# Patient Record
Sex: Male | Born: 1940 | Race: White | Hispanic: No | Marital: Married | Smoking: Never smoker
Health system: Southern US, Community
[De-identification: ages and names within clinical notes are randomized; demographics above are authoritative.]

## PROBLEM LIST (undated history)

## (undated) DIAGNOSIS — G629 Polyneuropathy, unspecified: Secondary | ICD-10-CM

## (undated) DIAGNOSIS — M1990 Unspecified osteoarthritis, unspecified site: Secondary | ICD-10-CM

## (undated) DIAGNOSIS — N200 Calculus of kidney: Secondary | ICD-10-CM

## (undated) DIAGNOSIS — L409 Psoriasis, unspecified: Secondary | ICD-10-CM

## (undated) DIAGNOSIS — M79669 Pain in unspecified lower leg: Secondary | ICD-10-CM

## (undated) DIAGNOSIS — F32A Depression, unspecified: Secondary | ICD-10-CM

## (undated) DIAGNOSIS — Z8719 Personal history of other diseases of the digestive system: Secondary | ICD-10-CM

## (undated) DIAGNOSIS — I499 Cardiac arrhythmia, unspecified: Secondary | ICD-10-CM

## (undated) DIAGNOSIS — I219 Acute myocardial infarction, unspecified: Secondary | ICD-10-CM

## (undated) DIAGNOSIS — I10 Essential (primary) hypertension: Secondary | ICD-10-CM

## (undated) DIAGNOSIS — F329 Major depressive disorder, single episode, unspecified: Secondary | ICD-10-CM

## (undated) DIAGNOSIS — G5700 Lesion of sciatic nerve, unspecified lower limb: Secondary | ICD-10-CM

## (undated) DIAGNOSIS — Z87442 Personal history of urinary calculi: Secondary | ICD-10-CM

## (undated) DIAGNOSIS — K429 Umbilical hernia without obstruction or gangrene: Secondary | ICD-10-CM

## (undated) DIAGNOSIS — G4730 Sleep apnea, unspecified: Secondary | ICD-10-CM

## (undated) DIAGNOSIS — E785 Hyperlipidemia, unspecified: Secondary | ICD-10-CM

## (undated) DIAGNOSIS — J302 Other seasonal allergic rhinitis: Secondary | ICD-10-CM

## (undated) DIAGNOSIS — K219 Gastro-esophageal reflux disease without esophagitis: Secondary | ICD-10-CM

## (undated) DIAGNOSIS — I2510 Atherosclerotic heart disease of native coronary artery without angina pectoris: Secondary | ICD-10-CM

## (undated) HISTORY — DX: Calculus of kidney: N20.0

## (undated) HISTORY — DX: Essential (primary) hypertension: I10

## (undated) HISTORY — DX: Major depressive disorder, single episode, unspecified: F32.9

## (undated) HISTORY — DX: Umbilical hernia without obstruction or gangrene: K42.9

## (undated) HISTORY — DX: Pain in unspecified lower leg: M79.669

## (undated) HISTORY — DX: Psoriasis, unspecified: L40.9

## (undated) HISTORY — DX: Depression, unspecified: F32.A

## (undated) HISTORY — DX: Hyperlipidemia, unspecified: E78.5

## (undated) HISTORY — DX: Atherosclerotic heart disease of native coronary artery without angina pectoris: I25.10

---

## 1996-01-20 HISTORY — PX: PROSTATE SURGERY: SHX751

## 2003-01-20 DIAGNOSIS — I499 Cardiac arrhythmia, unspecified: Secondary | ICD-10-CM

## 2003-01-20 HISTORY — DX: Cardiac arrhythmia, unspecified: I49.9

## 2009-07-24 ENCOUNTER — Encounter (INDEPENDENT_AMBULATORY_CARE_PROVIDER_SITE_OTHER): Payer: Self-pay | Admitting: Otolaryngology

## 2009-07-24 ENCOUNTER — Ambulatory Visit (HOSPITAL_COMMUNITY): Admission: RE | Admit: 2009-07-24 | Discharge: 2009-07-25 | Payer: Self-pay | Admitting: Otolaryngology

## 2009-07-24 HISTORY — PX: INNER EAR SURGERY: SHX679

## 2010-02-04 ENCOUNTER — Other Ambulatory Visit: Payer: Self-pay | Admitting: Dermatology

## 2010-03-13 ENCOUNTER — Ambulatory Visit (INDEPENDENT_AMBULATORY_CARE_PROVIDER_SITE_OTHER): Payer: Medicare Other | Admitting: Infectious Diseases

## 2010-03-13 ENCOUNTER — Encounter: Payer: Self-pay | Admitting: Infectious Diseases

## 2010-03-13 DIAGNOSIS — B449 Aspergillosis, unspecified: Secondary | ICD-10-CM | POA: Insufficient documentation

## 2010-03-13 DIAGNOSIS — H7290 Unspecified perforation of tympanic membrane, unspecified ear: Secondary | ICD-10-CM

## 2010-03-18 ENCOUNTER — Telehealth (INDEPENDENT_AMBULATORY_CARE_PROVIDER_SITE_OTHER): Payer: Self-pay | Admitting: *Deleted

## 2010-03-18 NOTE — Miscellaneous (Addendum)
  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

## 2010-03-27 NOTE — Progress Notes (Signed)
Summary: Pt. rxed for positive ear culture results per Dr. Maurice March  Phone Note Outgoing Call   Call placed by: Jennet Maduro, RN 03/18/10, 1433 Call placed to: Dr. Maurice March Action Taken: Information Sent Summary of Call: Ear culture results emailed to Dr. Maurice March for review.    Follow-up for Phone Call        Email response from Dr. Maurice March, "add colistin otic drops 2 TID to left ear and oral rifampin 300mg  Bid all for 10 days. Tim"     New/Updated Medications: COLY-MYCIN S 3.03-21-08-0.5 MG/ML SUSP (NEOMYCIN-COLIST-HC-THONZONIUM) Administer 2 drops in the left ear three times a day RIFAMPIN 300 MG CAPS (RIFAMPIN) Take 1 capsule by mouth two times a day for 10 days Prescriptions: RIFAMPIN 300 MG CAPS (RIFAMPIN) Take 1 capsule by mouth two times a day for 10 days  #20 x 0   Entered by:   Jennet Maduro RN   Authorized by:   Lina Sayre MD   Signed by:   Jennet Maduro RN on 03/19/2010   Method used:   Telephoned to ...       [facility]* (retail)       [address] [place], [place]  [address]       Ph: [phone] or [phone]       Fax: [phone]   RxID:   [id number] COLY-MYCIN S 3.03-21-08-0.5 MG/ML SUSP (NEOMYCIN-COLIST-HC-THONZONIUM) Administer 2 drops in the left ear three times a day  #1 bottle x 0   Entered by:   Jennet Maduro RN   Authorized by:   Lina Sayre MD   Signed by:   Jennet Maduro RN on 03/19/2010   Method used:   Telephoned to ...       [facility]* (retail)       [address] [place], [place]  [address]       Ph: [phone] or [phone]       Fax: [phone]   RxID:   [id number]  RX previously called to [facility] by Dr. Maurice March. .sign

## 2010-03-28 ENCOUNTER — Encounter: Payer: Self-pay | Admitting: *Deleted

## 2010-04-03 ENCOUNTER — Ambulatory Visit: Payer: Medicare Other | Admitting: Infectious Diseases

## 2010-04-06 LAB — ANAEROBIC CULTURE: Gram Stain: NONE SEEN

## 2010-04-06 LAB — WOUND CULTURE: Gram Stain: NONE SEEN

## 2010-04-07 LAB — BASIC METABOLIC PANEL
Calcium: 9 mg/dL (ref 8.4–10.5)
Creatinine, Ser: 1.1 mg/dL (ref 0.4–1.5)
GFR calc Af Amer: >60 mL/min (ref >60)
GFR calc non Af Amer: >60 mL/min (ref >60)
Glucose, Bld: 84 mg/dL (ref 70–99)
Sodium: 139 mEq/L (ref 135–145)

## 2010-04-07 LAB — CBC
HCT: 43.4 % (ref 39.0–52.0)
Hemoglobin: 14.9 g/dL (ref 13.0–17.0)
MCH: 32.2 pg (ref 26.0–34.0)
MCHC: 34.4 g/dL (ref 30.0–36.0)
MCV: 93.5 fL (ref 78.0–100.0)
Platelets: 176 10*3/uL (ref 150–400)
RBC: 4.64 MIL/uL (ref 4.22–5.81)
RDW: 13.1 % (ref 11.5–15.5)
WBC: 6.6 K/uL (ref 4.0–10.5)

## 2010-04-07 LAB — URINALYSIS, ROUTINE W REFLEX MICROSCOPIC
Bilirubin Urine: NEGATIVE
Glucose, UA: NEGATIVE mg/dL
Hgb urine dipstick: NEGATIVE
Ketones, ur: NEGATIVE mg/dL
Nitrite: NEGATIVE
Protein, ur: NEGATIVE mg/dL
Specific Gravity, Urine: 1.021 (ref 1.005–1.030)
Urobilinogen, UA: 0.2 mg/dL (ref 0.0–1.0)
pH: 5.5 (ref 5.0–8.0)

## 2010-04-07 LAB — SURGICAL PCR SCREEN
MRSA, PCR: NEGATIVE
Staphylococcus aureus: NEGATIVE

## 2010-04-07 LAB — BASIC METABOLIC PANEL WITH GFR
BUN: 16 mg/dL (ref 6–23)
CO2: 27 meq/L (ref 19–32)
Chloride: 108 meq/L (ref 96–112)
Potassium: 4.5 meq/L (ref 3.5–5.1)

## 2011-05-20 ENCOUNTER — Encounter (INDEPENDENT_AMBULATORY_CARE_PROVIDER_SITE_OTHER): Payer: Self-pay | Admitting: Surgery

## 2011-05-21 ENCOUNTER — Encounter (INDEPENDENT_AMBULATORY_CARE_PROVIDER_SITE_OTHER): Payer: Self-pay | Admitting: Surgery

## 2011-05-21 ENCOUNTER — Ambulatory Visit (INDEPENDENT_AMBULATORY_CARE_PROVIDER_SITE_OTHER): Payer: Medicare Other | Admitting: Surgery

## 2011-05-21 VITALS — BP 134/86 | HR 66 | Temp 98.0°F | Ht 71.0 in | Wt 189.6 lb

## 2011-05-21 DIAGNOSIS — K429 Umbilical hernia without obstruction or gangrene: Secondary | ICD-10-CM

## 2011-05-21 NOTE — Progress Notes (Addendum)
[name], male   DOB: 1940-11-15, 71 y.o.   MRN: [id number]  Chief Complaint  Patient presents with  . Umbilical Hernia    HPI Carlos Davidson is a 71 y.o. male.   HPIReferred by Dr. Susa Raring for evaluation of umbilical hernia  This is a healthy 71 year old male who is quite active working in his garden and roundhouse who presents with about a month history of some discomfort at his umbilicus. He has noticed a bulge in the upper part of his umbilicus it remains reducible. He gets uncomfortable when he reduces it but the discomfort resolves when he is supine. It has enlarged slightly over the last couple of weeks. He denies any obstructive symptoms.  Past Medical History  Diagnosis Date  . Depression   . Hyperlipidemia   . Hypertension   . Hemorrhoids   . Psoriasis   . Pain, lower leg     Past Surgical History  Procedure Date  . Inner ear surgery 07/24/2009  . Prostate surgery 1998    Family History  Problem Relation Age of Onset  . Cancer Mother     breast  . Heart disease Mother   . Kidney disease Father     Social History History  Substance Use Topics  . Smoking status: Never Smoker   . Smokeless tobacco: Not on file  . Alcohol Use: No    Allergies  Allergen Reactions  . Sulfonamide Derivatives   . Tetracycline   . Codeine     Current Outpatient Prescriptions  Medication Sig Dispense Refill  . aspirin 81 MG EC tablet Take 81 mg by mouth daily.        Marland Kitchen neomycin-colistin-hydrocortisone-thonzonium (COLY-MYCIN S) 3.03-21-08-0.5 MG/ML otic suspension Place 2 drops into the left ear 3 (three) times daily.        . rifampin (RIFADIN) 300 MG capsule Take 300 mg by mouth 2 (two) times daily. for 10 days       . sertraline (ZOLOFT) 50 MG tablet Take 50 mg by mouth daily.          Review of Systems Review of Systems  Constitutional: Negative for fever, chills and unexpected weight change.  HENT: Negative for hearing loss, congestion, sore throat,  trouble swallowing and voice change.   Eyes: Negative for visual disturbance.  Respiratory: Negative for cough and wheezing.   Cardiovascular: Negative for chest pain, palpitations and leg swelling.  Gastrointestinal: Negative for nausea, vomiting, abdominal pain, diarrhea, constipation, blood in stool, abdominal distention, anal bleeding and rectal pain.  Genitourinary: Negative for hematuria and difficulty urinating.  Musculoskeletal: Negative for arthralgias.  Skin: Negative for rash and wound.  Neurological: Negative for seizures, syncope, weakness and headaches.  Hematological: Negative for adenopathy. Does not bruise/bleed easily.  Psychiatric/Behavioral: Negative for confusion.    Blood pressure 134/86, pulse 66, temperature 98 F (36.7 C), temperature source Temporal, height 5\' 11"  (1.803 m), weight 189 lb 9.6 oz (86.002 kg), SpO2 96.00%.  Physical Exam Physical Exam WDWN in NAD HEENT:  EOMI, sclera anicteric Neck:  No masses, no thyromegaly Lungs:  CTA bilaterally; normal respiratory effort CV:  Regular rate and rhythm; no murmurs Abd:  +bowel sounds, soft, non-tender, small reducible hernia at the upper edge of his umbilicus GU:  No sign of inguinal hernia Ext:  Well-perfused; no edema Skin:  Warm, dry; no sign of jaundice  Data Reviewed none  Assessment    Umbilical hernia  - reducible    Plan  Umbilical hernia repair with mesh.  The surgical procedure has been discussed with the patient.  Potential risks, benefits, alternative treatments, and expected outcomes have been explained.  All of the patient's questions at this time have been answered.  The likelihood of reaching the patient's treatment goal is good.  The patient understand the proposed surgical procedure and wishes to proceed.        Beatrice Sehgal K. 05/21/2011, 12:18 PM    .

## 2011-08-04 ENCOUNTER — Encounter (INDEPENDENT_AMBULATORY_CARE_PROVIDER_SITE_OTHER): Payer: Self-pay | Admitting: Surgery

## 2011-08-04 ENCOUNTER — Ambulatory Visit (INDEPENDENT_AMBULATORY_CARE_PROVIDER_SITE_OTHER): Payer: Medicare Other | Admitting: Surgery

## 2011-08-04 VITALS — BP 146/82 | HR 72 | Temp 97.9°F | Resp 16 | Ht 71.0 in | Wt 190.5 lb

## 2011-08-04 DIAGNOSIS — K429 Umbilical hernia without obstruction or gangrene: Secondary | ICD-10-CM

## 2011-08-04 NOTE — Progress Notes (Signed)
The patient is here to update his H&P prior to surgery.  His last visit was in early May. His umbilical hernia has not enlarged and causes only mild discomfort.  Nothing new with his medical status.  PE:  Small partially reducible umbilical hernia at upper edge of umbilicus  Imp:  Umbilical hernia Plan:  Umbilical hernia repair with mesh. The surgical procedure has been discussed with the patient.  Potential risks, benefits, alternative treatments, and expected outcomes have been explained.  All of the patient's questions at this time have been answered.  The likelihood of reaching the patient's treatment goal is good.  The patient understand the proposed surgical procedure and wishes to proceed.  Carlos Davidson K. Carlos Eckard, MD, FACS [facility]  08/04/2011 9:48 AM    Old note from 05/21/11       Chief Complaint   Patient presents with   .  Umbilical Hernia        HPI Carlos Davidson is a 70 y.o. male.   HPIReferred by Dr. David Davidson for evaluation of umbilical hernia   This is a healthy 70-year-old male who is quite active working in his garden and roundhouse who presents with about a month history of some discomfort at his umbilicus. He has noticed a bulge in the upper part of his umbilicus it remains reducible. He gets uncomfortable when he reduces it but the discomfort resolves when he is supine. It has enlarged slightly over the last couple of weeks. He denies any obstructive symptoms.    Past Medical History   Diagnosis  Date   .  Depression     .  Hyperlipidemia     .  Hypertension     .  Hemorrhoids     .  Psoriasis     .  Pain, lower leg           Past Surgical History   Procedure  Date   .  Inner ear surgery  07/24/2009   .  Prostate surgery  1998         Family History   Problem  Relation  Age of Onset   .  Cancer  Mother         breast   .  Heart disease  Mother     .  Kidney disease  Father          Social History History   Substance Use  Topics   .  Smoking status:  Never Smoker    .  Smokeless tobacco:  Not on file   .  Alcohol Use:  No         Allergies   Allergen  Reactions   .  Sulfonamide Derivatives     .  Tetracycline     .  Codeine           Current Outpatient Prescriptions   Medication  Sig  Dispense  Refill   .  aspirin 81 MG EC tablet  Take 81 mg by mouth daily.           .  neomycin-colistin-hydrocortisone-thonzonium (COLY-MYCIN S) 3.03-21-08-0.5 MG/ML otic suspension  Place 2 drops into the left ear 3 (three) times daily.           .  rifampin (RIFADIN) 300 MG capsule  Take 300 mg by mouth 2 (two) times daily. for 10 days          .  sertraline (ZOLOFT) 50 MG tablet  Take 50   mg by mouth daily.                Review of Systems Review of Systems  Constitutional: Negative for fever, chills and unexpected weight change.  HENT: Negative for hearing loss, congestion, sore throat, trouble swallowing and voice change.   Eyes: Negative for visual disturbance.  Respiratory: Negative for cough and wheezing.   Cardiovascular: Negative for chest pain, palpitations and leg swelling.  Gastrointestinal: Negative for nausea, vomiting, abdominal pain, diarrhea, constipation, blood in stool, abdominal distention, anal bleeding and rectal pain.  Genitourinary: Negative for hematuria and difficulty urinating.  Musculoskeletal: Negative for arthralgias.  Skin: Negative for rash and wound.  Neurological: Negative for seizures, syncope, weakness and headaches.  Hematological: Negative for adenopathy. Does not bruise/bleed easily.  Psychiatric/Behavioral: Negative for confusion.      Blood pressure 134/86, pulse 66, temperature 98 F (36.7 C), temperature source Temporal, height 5' 11" (1.803 m), weight 189 lb 9.6 oz (86.002 kg), SpO2 96.00%.   Physical Exam Physical Exam WDWN in NAD HEENT:  EOMI, sclera anicteric Neck:  No masses, no thyromegaly Lungs:  CTA bilaterally; normal respiratory effort CV:   Regular rate and rhythm; no murmurs Abd:  +bowel sounds, soft, non-tender, small reducible hernia at the upper edge of his umbilicus GU:  No sign of inguinal hernia Ext:  Well-perfused; no edema Skin:  Warm, dry; no sign of jaundice   Data Reviewed none   Assessment    Umbilical hernia  - reducible     Plan    Umbilical hernia repair with mesh.  The surgical procedure has been discussed with the patient.  Potential risks, benefits, alternative treatments, and expected outcomes have been explained.  All of the patient's questions at this time have been answered.  The likelihood of reaching the patient's treatment goal is good.  The patient understand the proposed surgical procedure and wishes to proceed.            Carlos Davidson K. 05/21/2011, 12:18 PM      

## 2011-08-12 ENCOUNTER — Encounter (HOSPITAL_COMMUNITY): Payer: Self-pay | Admitting: Pharmacy Technician

## 2011-08-18 ENCOUNTER — Encounter (HOSPITAL_COMMUNITY): Payer: Self-pay

## 2011-08-18 ENCOUNTER — Encounter (HOSPITAL_COMMUNITY)
Admission: RE | Admit: 2011-08-18 | Discharge: 2011-08-18 | Disposition: A | Discharge status: Home / Self Care | Payer: Medicare Other | Source: Ambulatory Visit | Attending: Surgery | Admitting: Surgery

## 2011-08-18 ENCOUNTER — Ambulatory Visit (HOSPITAL_COMMUNITY)
Admission: RE | Admit: 2011-08-18 | Discharge: 2011-08-18 | Disposition: A | Discharge status: Home / Self Care | Payer: Medicare Other | Source: Ambulatory Visit | Attending: Surgery | Admitting: Surgery

## 2011-08-18 DIAGNOSIS — Z01812 Encounter for preprocedural laboratory examination: Secondary | ICD-10-CM | POA: Insufficient documentation

## 2011-08-18 DIAGNOSIS — I498 Other specified cardiac arrhythmias: Secondary | ICD-10-CM | POA: Insufficient documentation

## 2011-08-18 DIAGNOSIS — Z01818 Encounter for other preprocedural examination: Secondary | ICD-10-CM | POA: Insufficient documentation

## 2011-08-18 DIAGNOSIS — Z01810 Encounter for preprocedural cardiovascular examination: Secondary | ICD-10-CM | POA: Insufficient documentation

## 2011-08-18 HISTORY — DX: Sleep apnea, unspecified: G47.30

## 2011-08-18 HISTORY — DX: Other seasonal allergic rhinitis: J30.2

## 2011-08-18 HISTORY — DX: Lesion of sciatic nerve, unspecified lower limb: G57.00

## 2011-08-18 HISTORY — DX: Unspecified osteoarthritis, unspecified site: M19.90

## 2011-08-18 HISTORY — DX: Gastro-esophageal reflux disease without esophagitis: K21.9

## 2011-08-18 HISTORY — DX: Cardiac arrhythmia, unspecified: I49.9

## 2011-08-18 HISTORY — DX: Personal history of other diseases of the digestive system: Z87.19

## 2011-08-18 LAB — CBC
MCH: 31.2 pg (ref 26.0–34.0)
MCHC: 34.4 g/dL (ref 30.0–36.0)
MCV: 90.9 fL (ref 78.0–100.0)
Platelets: 179 10*3/uL (ref 150–400)
RBC: 4.61 MIL/uL (ref 4.22–5.81)
RDW: 13 % (ref 11.5–15.5)

## 2011-08-18 LAB — BASIC METABOLIC PANEL
Calcium: 8.6 mg/dL (ref 8.4–10.5)
Creatinine, Ser: 0.98 mg/dL (ref 0.50–1.35)
GFR calc Af Amer: >90 mL/min (ref >90)
GFR calc non Af Amer: 82 mL/min — ABNORMAL LOW (ref >90)
Sodium: 137 mEq/L (ref 135–145)

## 2011-08-18 LAB — SURGICAL PCR SCREEN
MRSA, PCR: NEGATIVE
Staphylococcus aureus: NEGATIVE

## 2011-08-18 NOTE — Patient Instructions (Addendum)
20 Carlos Davidson  08/18/2011   Your procedure is scheduled on:  08/26/11  Wednesday  Surgery 2956-2130  Report to [facility] at   0630    AM.  Call this number if you have problems the morning of surgery: [phone]     Or PST   [phone]  [facility]   Remember:   Do not eat food or drink any fluids :After Midnight. Tuesday NIGHT     STOP ASPIRIN, ANTIINFLAMMATORIES, or herbals 5 days before surgery-  TYLENOL IS OK  Take these medicines the morning of surgery with A SIP OF WATER: Zoloft   Do not wear jewelry, make-up or nail polish.  Do not wear lotions, powders, or perfumes. You may wear deodorant.  Do not shave 48 hours prior to surgery.  Do not bring valuables to the hospital.  Contacts, dentures or bridgework may not be worn into surgery.  Leave suitcase in the car. After surgery it may be brought to your room.  For patients admitted to the hospital, checkout time is 11:00 AM the day of discharge.   Patients discharged the day of surgery will not be allowed to drive home.  Name and phone number of your driver:wife                                                                      Special Instructions: CHG Shower Use Special Wash: 1/2 bottle night before surgery and 1/2 bottle morning of surgery. REGULAR SOAP FACE AND PRIVATES                        MEN-MAY SHAVE FACE MORNING OF SURGERY  Please read over the following fact sheets that you were given: MRSA Information

## 2011-08-18 NOTE — Progress Notes (Signed)
08/18/11 1013  OBSTRUCTIVE SLEEP APNEA  Have you ever been diagnosed with sleep apnea through a sleep study? No  Do you snore loudly (loud enough to be heard through closed doors)?  0  Do you often feel tired, fatigued, or sleepy during the daytime? 1  Has anyone observed you stop breathing during your sleep? 0  Do you have, or are you being treated for high blood pressure? 1  BMI more than 35 kg/m2? 0  Age over 69 years old? 1  Neck circumference greater than 40 cm/18 inches? 0  Gender: 1  Obstructive Sleep Apnea Score 4   Score 4 or greater  Updated health history;Results sent to PCP

## 2011-08-26 ENCOUNTER — Encounter (HOSPITAL_COMMUNITY): Payer: Self-pay | Admitting: *Deleted

## 2011-08-26 ENCOUNTER — Ambulatory Visit (HOSPITAL_COMMUNITY)
Admission: RE | Admit: 2011-08-26 | Discharge: 2011-08-26 | Disposition: A | Discharge status: Home / Self Care | Payer: Medicare Other | Source: Ambulatory Visit | Attending: Surgery | Admitting: Surgery

## 2011-08-26 ENCOUNTER — Encounter (HOSPITAL_COMMUNITY)
Admission: RE | Disposition: A | Discharge status: Home / Self Care | Payer: Self-pay | Source: Ambulatory Visit | Attending: Surgery

## 2011-08-26 ENCOUNTER — Ambulatory Visit (HOSPITAL_COMMUNITY): Payer: Medicare Other | Admitting: Anesthesiology

## 2011-08-26 ENCOUNTER — Encounter (HOSPITAL_COMMUNITY): Payer: Self-pay | Admitting: Anesthesiology

## 2011-08-26 DIAGNOSIS — E785 Hyperlipidemia, unspecified: Secondary | ICD-10-CM | POA: Insufficient documentation

## 2011-08-26 DIAGNOSIS — Z7982 Long term (current) use of aspirin: Secondary | ICD-10-CM | POA: Insufficient documentation

## 2011-08-26 DIAGNOSIS — K420 Umbilical hernia with obstruction, without gangrene: Secondary | ICD-10-CM | POA: Insufficient documentation

## 2011-08-26 DIAGNOSIS — I10 Essential (primary) hypertension: Secondary | ICD-10-CM | POA: Insufficient documentation

## 2011-08-26 DIAGNOSIS — L408 Other psoriasis: Secondary | ICD-10-CM | POA: Insufficient documentation

## 2011-08-26 DIAGNOSIS — K429 Umbilical hernia without obstruction or gangrene: Secondary | ICD-10-CM

## 2011-08-26 DIAGNOSIS — Z79899 Other long term (current) drug therapy: Secondary | ICD-10-CM | POA: Insufficient documentation

## 2011-08-26 HISTORY — PX: HERNIA REPAIR: SHX51

## 2011-08-26 HISTORY — PX: UMBILICAL HERNIA REPAIR: SHX196

## 2011-08-26 SURGERY — REPAIR, HERNIA, UMBILICAL, ADULT
Anesthesia: General | Site: Abdomen | Wound class: Clean

## 2011-08-26 MED ORDER — ONDANSETRON HCL 4 MG/2ML IJ SOLN: 4.0000 mg | INTRAMUSCULAR | Status: DC | PRN

## 2011-08-26 MED ORDER — PROPOFOL 10 MG/ML IV EMUL
INTRAVENOUS | Status: DC | PRN
  Administered 2011-08-26: 150 mg via INTRAVENOUS

## 2011-08-26 MED ORDER — MORPHINE SULFATE 10 MG/ML IJ SOLN: 2.0000 mg | INTRAMUSCULAR | Status: DC | PRN

## 2011-08-26 MED ORDER — BUPIVACAINE-EPINEPHRINE 0.25% -1:200000 IJ SOLN
INTRAMUSCULAR | Status: DC | PRN
  Administered 2011-08-26: 5 mL

## 2011-08-26 MED ORDER — HYDROCODONE-ACETAMINOPHEN 5-325 MG PO TABS: 1.0000 | ORAL_TABLET | ORAL | Status: DC | PRN

## 2011-08-26 MED ORDER — MIDAZOLAM HCL 5 MG/5ML IJ SOLN
INTRAMUSCULAR | Status: DC | PRN
  Administered 2011-08-26: 2 mg via INTRAVENOUS

## 2011-08-26 MED ORDER — ACETAMINOPHEN 10 MG/ML IV SOLN
INTRAVENOUS | Status: DC | PRN
  Administered 2011-08-26: 1000 mg via INTRAVENOUS

## 2011-08-26 MED ORDER — LACTATED RINGERS IV SOLN
INTRAVENOUS | Status: DC | PRN
  Administered 2011-08-26 (×2): via INTRAVENOUS

## 2011-08-26 MED ORDER — CEFAZOLIN SODIUM-DEXTROSE 2-3 GM-% IV SOLR
INTRAVENOUS | Status: AC
  Filled 2011-08-26: qty 50

## 2011-08-26 MED ORDER — BUPIVACAINE-EPINEPHRINE 0.25% -1:200000 IJ SOLN
INTRAMUSCULAR | Status: AC
  Filled 2011-08-26: qty 1

## 2011-08-26 MED ORDER — ACETAMINOPHEN 10 MG/ML IV SOLN
INTRAVENOUS | Status: AC
  Filled 2011-08-26: qty 100

## 2011-08-26 MED ORDER — CEFAZOLIN SODIUM-DEXTROSE 2-3 GM-% IV SOLR
2.0000 g | Freq: Once | INTRAVENOUS | Status: AC
  Administered 2011-08-26: 2 g via INTRAVENOUS

## 2011-08-26 MED ORDER — HYDROCODONE-ACETAMINOPHEN 5-325 MG PO TABS: 1.0000 | ORAL_TABLET | ORAL | Status: AC | PRN

## 2011-08-26 MED ORDER — KETOROLAC TROMETHAMINE 30 MG/ML IJ SOLN
INTRAMUSCULAR | Status: DC | PRN
  Administered 2011-08-26: 30 mg via INTRAVENOUS

## 2011-08-26 MED ORDER — HYDROMORPHONE HCL PF 1 MG/ML IJ SOLN: 0.2500 mg | INTRAMUSCULAR | Status: DC | PRN

## 2011-08-26 MED ORDER — FENTANYL CITRATE 0.05 MG/ML IJ SOLN
INTRAMUSCULAR | Status: DC | PRN
  Administered 2011-08-26: 50 ug via INTRAVENOUS

## 2011-08-26 SURGICAL SUPPLY — 38 items
BENZOIN TINCTURE PRP APPL 2/3 (GAUZE/BANDAGES/DRESSINGS) ×3 IMPLANT
BLADE HEX COATED 2.75 (ELECTRODE) ×3 IMPLANT
BLADE SURG 15 STRL LF DISP TIS (BLADE) ×2 IMPLANT
BLADE SURG 15 STRL SS (BLADE) ×1
CLOTH BEACON ORANGE TIMEOUT ST (SAFETY) ×3 IMPLANT
CLSR STERI-STRIP ANTIMIC 1/2X4 (GAUZE/BANDAGES/DRESSINGS) ×3 IMPLANT
DECANTER SPIKE VIAL GLASS SM (MISCELLANEOUS) ×3 IMPLANT
DRAPE LAPAROTOMY T 102X78X121 (DRAPES) ×3 IMPLANT
DRAPE UTILITY XL STRL (DRAPES) ×3 IMPLANT
DRSG TEGADERM 4X4.75 (GAUZE/BANDAGES/DRESSINGS) ×3 IMPLANT
ELECT REM PT RETURN 9FT ADLT (ELECTROSURGICAL) ×3
ELECTRODE REM PT RTRN 9FT ADLT (ELECTROSURGICAL) ×2 IMPLANT
GLOVE BIO SURGEON STRL SZ7 (GLOVE) ×3 IMPLANT
GLOVE BIOGEL PI IND STRL 7.0 (GLOVE) IMPLANT
GLOVE BIOGEL PI IND STRL 7.5 (GLOVE) ×2 IMPLANT
GLOVE BIOGEL PI INDICATOR 7.0 (GLOVE)
GLOVE BIOGEL PI INDICATOR 7.5 (GLOVE) ×1
GOWN STRL NON-REIN LRG LVL3 (GOWN DISPOSABLE) ×6 IMPLANT
GOWN STRL REIN XL XLG (GOWN DISPOSABLE) ×6 IMPLANT
KIT BASIN OR (CUSTOM PROCEDURE TRAY) ×3 IMPLANT
NEEDLE HYPO 22GX1.5 SAFETY (NEEDLE) ×3 IMPLANT
NEEDLE HYPO 25X1 1.5 SAFETY (NEEDLE) IMPLANT
NS IRRIG 1000ML POUR BTL (IV SOLUTION) ×3 IMPLANT
PACK BASIC VI WITH GOWN DISP (CUSTOM PROCEDURE TRAY) ×3 IMPLANT
PENCIL BUTTON HOLSTER BLD 10FT (ELECTRODE) ×3 IMPLANT
SPONGE GAUZE 4X4 12PLY (GAUZE/BANDAGES/DRESSINGS) ×3 IMPLANT
SPONGE LAP 4X18 X RAY DECT (DISPOSABLE) ×6 IMPLANT
STRIP CLOSURE SKIN 1/2X4 (GAUZE/BANDAGES/DRESSINGS) ×3 IMPLANT
SUT MNCRL AB 4-0 PS2 18 (SUTURE) ×3 IMPLANT
SUT NOVA NAB DX-16 0-1 5-0 T12 (SUTURE) IMPLANT
SUT NOVA NAB GS-21 0 18 T12 DT (SUTURE) ×3 IMPLANT
SUT PROLENE 0 CT 1 CR/8 (SUTURE) IMPLANT
SUT PROLENE 0 CT 2 (SUTURE) IMPLANT
SUT VIC AB 3-0 SH 27 (SUTURE) ×1
SUT VIC AB 3-0 SH 27X BRD (SUTURE) ×2 IMPLANT
SUT VIC AB 3-0 SH 27XBRD (SUTURE) IMPLANT
SYR CONTROL 10ML LL (SYRINGE) ×3 IMPLANT
TOWEL OR 17X26 10 PK STRL BLUE (TOWEL DISPOSABLE) ×3 IMPLANT

## 2011-08-26 NOTE — Op Note (Signed)
Indications:  The patient presented with a history of a small non-reducible umbilical hernia.  The patient was examined and we recommended umbilical hernia repair.  Pre-operative diagnosis:  Umbilical hernia  Post-operative diagnosis:  Same  Surgeon: Keymiah Lyles K.   Assistants: none  Anesthesia: General LMA anesthesia  ASA Class: 2   Procedure Details  The patient was seen again in the Holding Room. The risks, benefits, complications, treatment options, and expected outcomes were discussed with the patient. The possibilities of reaction to medication, pulmonary aspiration, perforation of viscus, bleeding, recurrent infection, the need for additional procedures, and development of a complication requiring transfusion or further operation were discussed with the patient and/or family. There was concurrence with the proposed plan, and informed consent was obtained. The site of surgery was properly noted/marked. The patient was taken to the Operating Room, identified as Carlos Davidson, and the procedure verified as umbilical hernia repair. A Time Out was held and the above information confirmed.  After an adequate level of general anesthesia was obtained, the patient's abdomen was prepped with Chloraprep and draped in sterile fashion.  We made a transverse incision above the umbilicus.  Dissection was carried down to the hernia sac with cautery.  We dissected bluntly around the hernia sac down to the edge of the fascial defect.  We reduced the hernia sac back into the pre-peritoneal space.  The fascial defect measured 5 mm.  We cleared the fascia in all directions.  The fascial defect was closed with multiple interrupted figure-of-eight 0 Novofil sutures.  The base of the umbilicus was tacked down with 3-0 Vicryl.  3-0 Vicryl was used to close the subcutaneous tissues and 4-0 Monocryl was used to close the skin.  Steri-strips and clean dressing were applied.  The patient was extubated and brought to  the recovery room in stable condition.  All sponge, instrument, and needle counts were correct prior to closure and at the conclusion of the case.   Estimated Blood Loss: Minimal          Complications: None; patient tolerated the procedure well.         Disposition: PACU - hemodynamically stable.         Condition: stable  [name]. [name], MD, [name]  08/26/2011 9:11 AM

## 2011-08-26 NOTE — H&P (View-Only) (Signed)
The patient is here to update his H&P prior to surgery.  His last visit was in early May. His umbilical hernia has not enlarged and causes only mild discomfort.  Nothing new with his medical status.  PE:  Small partially reducible umbilical hernia at upper edge of umbilicus  Imp:  Umbilical hernia Plan:  Umbilical hernia repair with mesh. The surgical procedure has been discussed with the patient.  Potential risks, benefits, alternative treatments, and expected outcomes have been explained.  All of the patient's questions at this time have been answered.  The likelihood of reaching the patient's treatment goal is good.  The patient understand the proposed surgical procedure and wishes to proceed.  Carlos Davidson. [name], MD, [facility]  08/04/2011 9:48 AM    Old note from 05/21/11       Chief Complaint   Patient presents with   .  Umbilical Hernia        HPI Carlos Davidson is a 71 y.o. male.   HPIReferred by Dr. Susa Raring for evaluation of umbilical hernia   This is a healthy 71 year old male who is quite active working in his garden and roundhouse who presents with about a month history of some discomfort at his umbilicus. He has noticed a bulge in the upper part of his umbilicus it remains reducible. He gets uncomfortable when he reduces it but the discomfort resolves when he is supine. It has enlarged slightly over the last couple of weeks. He denies any obstructive symptoms.    Past Medical History   Diagnosis  Date   .  Depression     .  Hyperlipidemia     .  Hypertension     .  Hemorrhoids     .  Psoriasis     .  Pain, lower leg           Past Surgical History   Procedure  Date   .  Inner ear surgery  07/24/2009   .  Prostate surgery  1998         Family History   Problem  Relation  Age of Onset   .  Cancer  Mother         breast   .  Heart disease  Mother     .  Kidney disease  Father          Social History History   Substance Use  Topics   .  Smoking status:  Never Smoker    .  Smokeless tobacco:  Not on file   .  Alcohol Use:  No         Allergies   Allergen  Reactions   .  Sulfonamide Derivatives     .  Tetracycline     .  Codeine           Current Outpatient Prescriptions   Medication  Sig  Dispense  Refill   .  aspirin 81 MG EC tablet  Take 81 mg by mouth daily.           Marland Kitchen  neomycin-colistin-hydrocortisone-thonzonium (COLY-MYCIN S) 3.03-21-08-0.5 MG/ML otic suspension  Place 2 drops into the left ear 3 (three) times daily.           .  rifampin (RIFADIN) 300 MG capsule  Take 300 mg by mouth 2 (two) times daily. for 10 days          .  sertraline (ZOLOFT) 50 MG tablet  Take 50  mg by mouth daily.                Review of Systems Review of Systems  Constitutional: Negative for fever, chills and unexpected weight change.  HENT: Negative for hearing loss, congestion, sore throat, trouble swallowing and voice change.   Eyes: Negative for visual disturbance.  Respiratory: Negative for cough and wheezing.   Cardiovascular: Negative for chest pain, palpitations and leg swelling.  Gastrointestinal: Negative for nausea, vomiting, abdominal pain, diarrhea, constipation, blood in stool, abdominal distention, anal bleeding and rectal pain.  Genitourinary: Negative for hematuria and difficulty urinating.  Musculoskeletal: Negative for arthralgias.  Skin: Negative for rash and wound.  Neurological: Negative for seizures, syncope, weakness and headaches.  Hematological: Negative for adenopathy. Does not bruise/bleed easily.  Psychiatric/Behavioral: Negative for confusion.      Blood pressure 134/86, pulse 66, temperature 98 F (36.7 C), temperature source Temporal, height 5\' 11"  (1.803 m), weight 189 lb 9.6 oz (86.002 kg), SpO2 96.00%.   Physical Exam Physical Exam WDWN in NAD HEENT:  EOMI, sclera anicteric Neck:  No masses, no thyromegaly Lungs:  CTA bilaterally; normal respiratory effort CV:   Regular rate and rhythm; no murmurs Abd:  +bowel sounds, soft, non-tender, small reducible hernia at the upper edge of his umbilicus GU:  No sign of inguinal hernia Ext:  Well-perfused; no edema Skin:  Warm, dry; no sign of jaundice   Data Reviewed none   Assessment    Umbilical hernia  - reducible     Plan    Umbilical hernia repair with mesh.  The surgical procedure has been discussed with the patient.  Potential risks, benefits, alternative treatments, and expected outcomes have been explained.  All of the patient's questions at this time have been answered.  The likelihood of reaching the patient's treatment goal is good.  The patient understand the proposed surgical procedure and wishes to proceed.            [name] 05/21/2011, 12:18 PM

## 2011-08-26 NOTE — Interval H&P Note (Signed)
History and Physical Interval Note:  08/26/2011 8:19 AM  Carlos Davidson  has presented today for surgery, with the diagnosis of umbilical hernia  The various methods of treatment have been discussed with the patient and family. After consideration of risks, benefits and other options for treatment, the patient has consented to  Procedure(s) (LRB): HERNIA REPAIR UMBILICAL ADULT (N/A) INSERTION OF MESH (N/A) as a surgical intervention .  The patient's history has been reviewed, patient examined, no change in status, stable for surgery.  I have reviewed the patient's chart and labs.  Questions were answered to the patient's satisfaction.     Dayln Tugwell K.

## 2011-08-26 NOTE — Anesthesia Preprocedure Evaluation (Signed)
Anesthesia Evaluation  Patient identified by MRN, date of birth, ID band Patient awake    Reviewed: Allergy & Precautions, H&P , NPO status , Patient's Chart, lab work & pertinent test results, reviewed documented beta blocker date and time   Airway Mallampati: II TM Distance: >3 FB Neck ROM: Full    Dental  (+) Teeth Intact and Dental Advisory Given   Pulmonary neg pulmonary ROS,  breath sounds clear to auscultation        Cardiovascular negative cardio ROS  Rhythm:Regular Rate:Normal  Denies HTN Denies cardiac symptoms   Neuro/Psych negative neurological ROS  negative psych ROS   GI/Hepatic negative GI ROS, Neg liver ROS,   Endo/Other  negative endocrine ROS  Renal/GU negative Renal ROS  negative genitourinary   Musculoskeletal   Abdominal   Peds negative pediatric ROS (+)  Hematology negative hematology ROS (+)   Anesthesia Other Findings   Reproductive/Obstetrics negative OB ROS                           Anesthesia Physical Anesthesia Plan  ASA: II  Anesthesia Plan: General   Post-op Pain Management:    Induction: Intravenous  Airway Management Planned: LMA  Additional Equipment:   Intra-op Plan:   Post-operative Plan: Extubation in OR  Informed Consent: I have reviewed the patients History and Physical, chart, labs and discussed the procedure including the risks, benefits and alternatives for the proposed anesthesia with the patient or authorized representative who has indicated his/her understanding and acceptance.   Dental advisory given  Plan Discussed with: CRNA and Surgeon  Anesthesia Plan Comments:         Anesthesia Quick Evaluation

## 2011-08-26 NOTE — Transfer of Care (Signed)
Immediate Anesthesia Transfer of Care Note  Patient: Carlos Davidson  Procedure(s) Performed: Procedure(s) (LRB): HERNIA REPAIR UMBILICAL ADULT (N/A)  Patient Location: PACU  Anesthesia Type: General  Level of Consciousness: awake, sedated and patient cooperative  Airway & Oxygen Therapy: Patient Spontanous Breathing and Patient connected to face mask oxygen  Post-op Assessment: Report given to PACU RN and Post -op Vital signs reviewed and stable  Post vital signs: Reviewed and stable  Complications: No apparent anesthesia complications

## 2011-08-26 NOTE — Anesthesia Postprocedure Evaluation (Signed)
  Anesthesia Post-op Note  Patient: Carlos Davidson  Procedure(s) Performed: Procedure(s) (LRB): HERNIA REPAIR UMBILICAL ADULT (N/A)  Patient Location: PACU  Anesthesia Type: General  Level of Consciousness: oriented and sedated  Airway and Oxygen Therapy: Patient Spontanous Breathing  Post-op Pain: mild  Post-op Assessment: Post-op Vital signs reviewed, Patient's Cardiovascular Status Stable, Respiratory Function Stable and Patent Airway  Post-op Vital Signs: stable  Complications: No apparent anesthesia complications

## 2011-08-27 ENCOUNTER — Encounter (HOSPITAL_COMMUNITY): Payer: Self-pay | Admitting: Surgery

## 2011-08-27 ENCOUNTER — Telehealth (INDEPENDENT_AMBULATORY_CARE_PROVIDER_SITE_OTHER): Payer: Self-pay

## 2011-08-27 NOTE — Telephone Encounter (Signed)
Pt home doing well. PO appt made. 

## 2011-09-11 ENCOUNTER — Encounter (INDEPENDENT_AMBULATORY_CARE_PROVIDER_SITE_OTHER): Payer: Self-pay | Admitting: Surgery

## 2011-09-11 ENCOUNTER — Ambulatory Visit (INDEPENDENT_AMBULATORY_CARE_PROVIDER_SITE_OTHER): Payer: Medicare Other | Admitting: Surgery

## 2011-09-11 VITALS — BP 113/66 | HR 80 | Temp 97.7°F | Resp 18 | Ht 71.0 in | Wt 194.8 lb

## 2011-09-11 DIAGNOSIS — K429 Umbilical hernia without obstruction or gangrene: Secondary | ICD-10-CM

## 2011-09-11 NOTE — Progress Notes (Signed)
S/p umbilical hernia repair with mesh on 08/26/11.  The hernia defect was closed primarily.  The incision is healing well with no sign of infection.  He had some skin sensitivity from the tape, but this is resolved.  He may begin increasing his level of activity and can follow-up PRN.  [name]. [name], MD, [facility] Surgery  09/11/2011 10:51 AM

## 2013-01-16 ENCOUNTER — Ambulatory Visit: Payer: Medicare Other

## 2013-01-16 ENCOUNTER — Ambulatory Visit (INDEPENDENT_AMBULATORY_CARE_PROVIDER_SITE_OTHER): Payer: Medicare Other | Admitting: Internal Medicine

## 2013-01-16 VITALS — BP 152/78 | HR 66 | Temp 98.3°F | Resp 16 | Ht 71.5 in | Wt 197.8 lb

## 2013-01-16 DIAGNOSIS — R109 Unspecified abdominal pain: Secondary | ICD-10-CM

## 2013-01-16 DIAGNOSIS — T300 Burn of unspecified body region, unspecified degree: Secondary | ICD-10-CM

## 2013-01-16 DIAGNOSIS — N23 Unspecified renal colic: Secondary | ICD-10-CM

## 2013-01-16 LAB — POCT CBC
Granulocyte percent: 69.1 %G (ref 37–80)
Hemoglobin: 14.2 g/dL (ref 14.1–18.1)
MCH, POC: 31 pg (ref 27–31.2)
MCV: 99.1 fL — AB (ref 80–97)
MPV: 8.7 fL (ref 0–99.8)
POC MID %: 7.2 %M (ref 0–12)
RBC: 4.58 M/uL — AB (ref 4.69–6.13)
WBC: 8.8 10*3/uL (ref 4.6–10.2)

## 2013-01-16 LAB — POCT URINALYSIS DIPSTICK
Bilirubin, UA: NEGATIVE
Glucose, UA: NEGATIVE
Nitrite, UA: NEGATIVE

## 2013-01-16 LAB — POCT UA - MICROSCOPIC ONLY: Crystals, Ur, HPF, POC: NEGATIVE

## 2013-01-16 MED ORDER — KETOROLAC TROMETHAMINE 30 MG/ML IJ SOLN
30.0000 mg | Freq: Once | INTRAMUSCULAR | Status: AC
  Administered 2013-01-16: 30 mg via INTRAMUSCULAR

## 2013-01-16 MED ORDER — TAMSULOSIN HCL 0.4 MG PO CAPS: 0.4000 mg | ORAL_CAPSULE | Freq: Every day | ORAL | Status: DC

## 2013-01-16 MED ORDER — TRAMADOL HCL 50 MG PO TABS: 50.0000 mg | ORAL_TABLET | Freq: Three times a day (TID) | ORAL | Status: DC | PRN

## 2013-01-16 NOTE — Patient Instructions (Addendum)
Increase fluids. Flomax as directed. Ultram as directed. See the urologist in follow up as directed. If the red area on your back gets worse or increases return to the office. Do not apply hot compresses to your back.

## 2013-01-16 NOTE — Progress Notes (Signed)
Subjective:    [name], male    DOB: 11-Nov-1940, 72 y.o.   MRN: [id number]  Back Pain  72 year old gentleman with cc of sharp and throbbing pain in the left flank area for the past 2 days. Pain has gotten worse ranks it 8/10 in one localized area no radiation. No nausea no vomiting no fever. Pain does not increase with motion.No midline back pain no incontinence, No weakness or numbness of extremities. No known injury. Pt has had immunization for herpes zoster. His wife has been putting a hot pack on the area. He is unsure whether this has caused a burn to the area.Pain is improved with Pain is improved with advil but comes back after several hours.    Review of Systems  Constitutional: Negative for activity change.  HENT: Negative.   Eyes: Negative.   Respiratory: Negative.   Cardiovascular: Negative.   Endocrine: Negative.   Genitourinary: Negative.  Negative for urgency, frequency, hematuria, flank pain and decreased urine volume.  Musculoskeletal: Positive for back pain.  Skin: Negative.   Allergic/Immunologic: Negative.   Neurological: Negative.   Hematological: Negative.   Psychiatric/Behavioral: Negative.   All other systems reviewed and are negative.       Objective:   Physical Exam  Nursing note and vitals reviewed. Constitutional: He is oriented to person, place, and time. He appears well-developed and well-nourished.  HENT:  Head: Normocephalic.  Nose: Nose normal.  Mouth/Throat: Oropharynx is clear and moist.  Eyes: Conjunctivae and EOM are normal. Pupils are equal, round, and reactive to light.  Neck: Normal range of motion. Neck supple.  Cardiovascular: Normal rate, regular rhythm, normal heart sounds and intact distal pulses.   Pulmonary/Chest: Effort normal and breath sounds normal.  Abdominal: Soft. Bowel sounds are normal.  Musculoskeletal: Normal range of motion.  Neurological: He is alert and oriented to person, place, and time. He  has normal reflexes.  Skin: Skin is warm and dry. There is erythema.  Erythematous area to the left flank where he is having pain no vesicles or blisters. Pt has been using hot packs to the area  Psychiatric: He has a normal mood and affect. His behavior is normal. Thought content normal.     Results for orders placed in visit on 01/16/13  POCT CBC      Result Value Range   WBC 8.8  4.6 - 10.2 K/uL   Lymph, poc 2.1  0.6 - 3.4   POC LYMPH PERCENT 23.7  10 - 50 %L   MID (cbc) 0.6  0 - 0.9   POC MID % 7.2  0 - 12 %M   POC Granulocyte 6.1  2 - 6.9   Granulocyte percent 69.1  37 - 80 %G   RBC 4.58 (*) 4.69 - 6.13 M/uL   Hemoglobin 14.2  14.1 - 18.1 g/dL   HCT, POC 40.9  81.1 - 53.7 %   MCV 99.1 (*) 80 - 97 fL   MCH, POC 31.0  27 - 31.2 pg   MCHC 31.3 (*) 31.8 - 35.4 g/dL   RDW, POC 91.4     Platelet Count, POC 182  142 - 424 K/uL   MPV 8.7  0 - 99.8 fL  POCT URINALYSIS DIPSTICK      Result Value Range   Color, UA yellow     Clarity, UA clear     Glucose, UA neg     Bilirubin, UA neg  Ketones, UA neg     Spec Grav, UA 1.025     Blood, UA trace     pH, UA 5.5     Protein, UA neg     Urobilinogen, UA 0.2     Nitrite, UA neg     Leukocytes, UA Negative    POCT UA - MICROSCOPIC ONLY      Result Value Range   WBC, Ur, HPF, POC 1-2     RBC, urine, microscopic 3-5     Bacteria, U Microscopic trace     Mucus, UA neg     Epithelial cells, urine per micros 1-2     Crystals, Ur, HPF, POC neg     Casts, Ur, LPF, POC neg     Yeast, UA neg        3-6 rbc trace blood in urine [facility] reading (PRIMARY) by  Dr. Mindi Junker calcification l upper flank may be consistent with kidney stone..  Assessment & Plan:  72 year old gentleman with flank pain on the left, normal cbc, small amount of blood in the urine. Will treat as renal colic with analgesics and increase flluids. Flomax. Follow up with urology. If pain increases patient is instructed to go to the ER where further radiologic  evaluation including CT is possible to evaluate for kidney stone on the left. . The red area on his back is not vesicular in nature but may represent an early outbreak of shingles. Pt is instructed to look at this in the am , to avoid apply any more hot compresses to the area. If the area is breaking out with blisters or extending he is to return to this office for further evaluation and possible treatment of shingles in the am.

## 2013-02-10 ENCOUNTER — Other Ambulatory Visit: Payer: Self-pay | Admitting: Urology

## 2013-02-14 ENCOUNTER — Encounter (HOSPITAL_COMMUNITY): Payer: Self-pay | Admitting: General Practice

## 2013-02-14 NOTE — Progress Notes (Signed)
Pt. Denies any cardiac history. Pt. Has not had any work-up for any heart problems.

## 2013-02-15 ENCOUNTER — Encounter (HOSPITAL_COMMUNITY): Payer: Self-pay | Admitting: Pharmacy Technician

## 2013-02-16 ENCOUNTER — Ambulatory Visit (HOSPITAL_COMMUNITY)
Admission: RE | Admit: 2013-02-16 | Discharge: 2013-02-16 | Disposition: A | Discharge status: Home / Self Care | Payer: Medicare HMO | Source: Ambulatory Visit | Attending: Urology | Admitting: Urology

## 2013-02-16 ENCOUNTER — Encounter (HOSPITAL_COMMUNITY)
Admission: RE | Disposition: A | Discharge status: Home / Self Care | Payer: Self-pay | Source: Ambulatory Visit | Attending: Urology

## 2013-02-16 ENCOUNTER — Encounter (HOSPITAL_COMMUNITY): Payer: Self-pay | Admitting: *Deleted

## 2013-02-16 ENCOUNTER — Ambulatory Visit (HOSPITAL_COMMUNITY): Payer: Medicare HMO

## 2013-02-16 DIAGNOSIS — N201 Calculus of ureter: Secondary | ICD-10-CM | POA: Insufficient documentation

## 2013-02-16 SURGERY — LITHOTRIPSY, ESWL
Anesthesia: LOCAL | Laterality: Left

## 2013-02-16 MED ORDER — CIPROFLOXACIN HCL 500 MG PO TABS
500.0000 mg | ORAL_TABLET | ORAL | Status: AC
Start: 2013-02-16 — End: 2013-02-16
  Administered 2013-02-16: 500 mg via ORAL
  Filled 2013-02-16: qty 1

## 2013-02-16 MED ORDER — DIPHENHYDRAMINE HCL 25 MG PO CAPS
25.0000 mg | ORAL_CAPSULE | ORAL | Status: AC
  Administered 2013-02-16: 25 mg via ORAL
  Filled 2013-02-16: qty 1

## 2013-02-16 MED ORDER — DEXTROSE-NACL 5-0.45 % IV SOLN
INTRAVENOUS | Status: DC
  Administered 2013-02-16: 10:00:00 via INTRAVENOUS

## 2013-02-16 MED ORDER — DIAZEPAM 5 MG PO TABS
10.0000 mg | ORAL_TABLET | ORAL | Status: AC
  Administered 2013-02-16: 10 mg via ORAL
  Filled 2013-02-16: qty 2

## 2013-02-16 NOTE — Interval H&P Note (Signed)
History and Physical Interval Note:  02/16/2013 9:05 AM  Carlos Davidson  has presented today for surgery, with the diagnosis of LEFT URETERAL STONE   The various methods of treatment have been discussed with the patient and family. After consideration of risks, benefits and other options for treatment, the patient has consented to  Procedure(s): LEFT EXTRACORPOREAL SHOCK WAVE LITHOTRIPSY (ESWL) (Left) as a surgical intervention .  The patient's history has been reviewed, patient examined, no change in status, stable for surgery.  I have reviewed the patient's chart and labs.  Questions were answered to the patient's satisfaction.     Carolan Clines I

## 2013-02-16 NOTE — H&P (Signed)
ason For Visit 3 week f/u & KUB   Active Problems Problems  1. Left flank pain (789.09) 2. Microscopic hematuria (599.72) 3. 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 He still has not passed the stone. He continues to have intermittant Lt flank pain.     Originally referred by Dr. Benjaman Lobe for further evaluation of Lt flank pain & hematuria (3-5 RBC/hpf). He was started on Flomax. Pain occasionally moves to the LUQ, but never to the LLQ or testis.     Problem began 4 weeks ago with intermittent Left flank pain, Rx Advil, and was seen at urgent Care, with microscopic hematuria. He has had constant nausea, but has now begun to have vomiting this AM. no fever or chills. No hx of stones. Sodas: 1/week. No family hx of stones.   Past Medical History Problems  1. History of depression (V11.8) 2. History of gastroesophageal reflux (GERD) (V12.79)  Surgical History Problems  1. History of Ear Surgery 2. History of Hernia Repair 3. History of Transurethral Resection Of Prostate (TURP)  Current Meds 1. Tamsulosin HCl - 0.4 MG Oral Capsule; TAKE 1 CAPSULE Bedtime;  Therapy: [id number] to (Evaluate:26Dec2015)  Requested for: [id number]; Last  Rx:31Dec2014 Ordered 2. Zoloft TABS;  Therapy: (Recorded:31Dec2014) to Recorded  Allergies Medication  1. streptomycin 2. Sulfa Drugs 3. tetracycline  Family History Problems  1. Family history of myocardial infarction (V17.3) : Mother, Father 2. Family history of renal failure (V18.69) : Father  Social History Problems  1. Denied: History of Alcohol use 2. Caffeine use (V49.89)   1/week 3. Married 4. Never a smoker (V49.89)  Review of Systems Constitutional, skin, eye, otolaryngeal, hematologic/lymphatic, cardiovascular, pulmonary, endocrine, musculoskeletal, neurological and psychiatric system(s) were reviewed and  pertinent findings if present are noted.  Genitourinary: feelings of urinary urgency, nocturia, weak urinary stream, urinary stream starts and stops and hematuria, but no urinary frequency, no dysuria, no incontinence, no difficulty starting the urinary stream, no incomplete emptying of bladder, no post-void dribbling and initiating urination does not require straining.  Gastrointestinal: nausea, vomiting, flank pain and diarrhea, but no abdominal pain, no heartburn, no constipation and no melena.  Constitutional: no fever and no night sweats.    Vitals Vital Signs [Data Includes: Last 1 Day]  Recorded: 20Jan2015 02:55PM  Blood Pressure: 111 / 68 Temperature: 97.5 F Heart Rate: 81  Physical Exam Constitutional: Well nourished and well developed . No acute distress.  ENT:. The ears and nose are normal in appearance.  Neck: The appearance of the neck is normal and no neck mass is present.  Pulmonary: No respiratory distress and normal respiratory rhythm and effort.  Cardiovascular: Heart rate and rhythm are normal . No peripheral edema.  Abdomen: The abdomen is soft and nontender. No masses are palpated. No CVA tenderness. No hernias are palpable. No hepatosplenomegaly noted.  Genitourinary: Examination of the penis demonstrates no discharge, no masses, no lesions and a normal meatus. The scrotum is without lesions. The right epididymis is palpably normal and non-tender. The left epididymis is palpably normal and non-tender. The right testis is non-tender and without masses. The left testis is non-tender and without masses.  Lymphatics: The femoral and inguinal nodes are not enlarged or tender.  Skin: Normal skin turgor, no visible rash and no visible skin lesions.  Neuro/Psych:. Mood and affect are appropriate.  Results/Data Urine [Data Includes: Last 1 Day]   [id number]  COLOR GREEN   APPEARANCE CLEAR   SPECIFIC GRAVITY 1.025   pH 6.0   GLUCOSE NEG mg/dL  BILIRUBIN NEG   KETONE NEG  mg/dL  BLOOD TRACE   PROTEIN NEG mg/dL  UROBILINOGEN 0.2 mg/dL  NITRITE NEG   LEUKOCYTE ESTERASE NEG   SQUAMOUS EPITHELIAL/HPF RARE   WBC NONE SEEN WBC/hpf  RBC 0-2 RBC/hpf  BACTERIA NONE SEEN   CRYSTALS NONE SEEN   CASTS NONE SEEN   Selected Results  AU CT-STONE PROTOCOL [id number] 12:00AM Carolan Clines   Test Name Result Flag Reference  CT-STONE PROTOCOL (Report)    ** RADIOLOGY REPORT BY [facility] **   CLINICAL DATA: Left flank pain and microhematuria.  EXAM: CT ABDOMEN AND PELVIS WITHOUT CONTRAST (URINARY CALCULUS PROTOCOL)  TECHNIQUE: Multidetector CT imaging was performed through the abdomen and pelvis without intravenous contrast to include the urinary tract.  COMPARISON: None.  FINDINGS: There is left hydronephrosis with left perinephric stranding due to obstruction by a 5 mm stone in the proximal left ureter. There are no kidney stones bilaterally. There is question subtle low density lesion measuring 1 cm in the lower pole left kidney.  There are low density lesions within the liver, the largest is in the inferior right lobe liver measuring 2.8 x 3.9 cm. Evaluation is limited without contrast. These may represent liver cysts. The spleen, pancreas, adrenal glands are normal. There is no aortic aneurysm. There is no abdominal lymphadenopathy. There is no small bowel obstruction or diverticulitis. The appendix is normal. There is diverticulosis of colon.  Fluid-filled bladder is normal. Pelvic phleboliths are identified. There is minimal atelectasis or scar of the left lung base. There are degenerative joint changes of the spine. No acute abnormality is identified within the visualized bones.  IMPRESSION: Left hydronephrosis due to obstruction by 5 mm stone in the proximal left ureter.1   Electronically Signed  By: Abelardo Diesel M.D.  On: 01/18/2013 14:04   Procedure Left upper ureteral stone, 80mm, not progressing. He is having  intermittent L flank pain, some n/v. and pain. KUB shows the stone in the same position as the original x-rays. i have advised Mr Humphrey to have lithotripsy of the 25mm L upper ureteral stone.     Assessment Assessed  1. Left flank pain (789.09) 2. Ureteral stone (592.1) 3. Microscopic hematuria (599.72)  Continued pain from Left upper ureteral stone. KUB shows non-progression. Concern now is that stone is in original position.   Plan Lithotripsy.   Discussion/Summary cc: Ferman Hamming, MD [facility] [place]  cc: Dr. Maury Dus, [facility]     Signatures Electronically signed by : Carolan Clines, M.D.; Feb 07 2013  3:22PM EST

## 2013-02-16 NOTE — Discharge Instructions (Signed)
See [facility] instructions .  As per Dr Gaynelle Arabian, you are to resume all your home medications. You have a refill for flomax and percocet.

## 2013-07-09 ENCOUNTER — Emergency Department (HOSPITAL_BASED_OUTPATIENT_CLINIC_OR_DEPARTMENT_OTHER)
Admission: EM | Admit: 2013-07-09 | Discharge: 2013-07-09 | Disposition: A | Discharge status: Home / Self Care | Payer: Medicare HMO | Attending: Emergency Medicine | Admitting: Emergency Medicine

## 2013-07-09 ENCOUNTER — Encounter (HOSPITAL_BASED_OUTPATIENT_CLINIC_OR_DEPARTMENT_OTHER): Payer: Self-pay | Admitting: Emergency Medicine

## 2013-07-09 DIAGNOSIS — I499 Cardiac arrhythmia, unspecified: Secondary | ICD-10-CM | POA: Insufficient documentation

## 2013-07-09 DIAGNOSIS — M5431 Sciatica, right side: Secondary | ICD-10-CM

## 2013-07-09 DIAGNOSIS — Z7982 Long term (current) use of aspirin: Secondary | ICD-10-CM | POA: Insufficient documentation

## 2013-07-09 DIAGNOSIS — I10 Essential (primary) hypertension: Secondary | ICD-10-CM | POA: Insufficient documentation

## 2013-07-09 DIAGNOSIS — M129 Arthropathy, unspecified: Secondary | ICD-10-CM | POA: Insufficient documentation

## 2013-07-09 DIAGNOSIS — Z8719 Personal history of other diseases of the digestive system: Secondary | ICD-10-CM | POA: Insufficient documentation

## 2013-07-09 DIAGNOSIS — F3289 Other specified depressive episodes: Secondary | ICD-10-CM | POA: Insufficient documentation

## 2013-07-09 DIAGNOSIS — Z872 Personal history of diseases of the skin and subcutaneous tissue: Secondary | ICD-10-CM | POA: Insufficient documentation

## 2013-07-09 DIAGNOSIS — Z9889 Other specified postprocedural states: Secondary | ICD-10-CM | POA: Insufficient documentation

## 2013-07-09 DIAGNOSIS — F329 Major depressive disorder, single episode, unspecified: Secondary | ICD-10-CM | POA: Insufficient documentation

## 2013-07-09 DIAGNOSIS — Z79899 Other long term (current) drug therapy: Secondary | ICD-10-CM | POA: Insufficient documentation

## 2013-07-09 DIAGNOSIS — M5430 Sciatica, unspecified side: Secondary | ICD-10-CM | POA: Insufficient documentation

## 2013-07-09 MED ORDER — HYDROCODONE-ACETAMINOPHEN 5-325 MG PO TABS: 1.0000 | ORAL_TABLET | Freq: Four times a day (QID) | ORAL | Status: DC | PRN

## 2013-07-09 MED ORDER — PREDNISONE 10 MG PO TABS: 20.0000 mg | ORAL_TABLET | Freq: Two times a day (BID) | ORAL | Status: DC

## 2013-07-09 NOTE — Discharge Instructions (Signed)
Prednisone as prescribed. Hydrocodone as prescribed as needed for pain.  Follow up with your primary Dr. if not improving in the next week.   Sciatica Sciatica is pain, weakness, numbness, or tingling along the path of the sciatic nerve. The nerve starts in the lower back and runs down the back of each leg. The nerve controls the muscles in the lower leg and in the back of the knee, while also providing sensation to the back of the thigh, lower leg, and the sole of your foot. Sciatica is a symptom of another medical condition. For instance, nerve damage or certain conditions, such as a herniated disk or bone spur on the spine, pinch or put pressure on the sciatic nerve. This causes the pain, weakness, or other sensations normally associated with sciatica. Generally, sciatica only affects one side of the body. CAUSES   Herniated or slipped disc.  Degenerative disk disease.  A pain disorder involving the narrow muscle in the buttocks (piriformis syndrome).  Pelvic injury or fracture.  Pregnancy.  Tumor (rare). SYMPTOMS  Symptoms can vary from mild to very severe. The symptoms usually travel from the low back to the buttocks and down the back of the leg. Symptoms can include:  Mild tingling or dull aches in the lower back, leg, or hip.  Numbness in the back of the calf or sole of the foot.  Burning sensations in the lower back, leg, or hip.  Sharp pains in the lower back, leg, or hip.  Leg weakness.  Severe back pain inhibiting movement. These symptoms may get worse with coughing, sneezing, laughing, or prolonged sitting or standing. Also, being overweight may worsen symptoms. DIAGNOSIS  Your caregiver will perform a physical exam to look for common symptoms of sciatica. He or she may ask you to do certain movements or activities that would trigger sciatic nerve pain. Other tests may be performed to find the cause of the sciatica. These may include:  Blood  tests.  X-rays.  Imaging tests, such as an MRI or CT scan. TREATMENT  Treatment is directed at the cause of the sciatic pain. Sometimes, treatment is not necessary and the pain and discomfort goes away on its own. If treatment is needed, your caregiver may suggest:  Over-the-counter medicines to relieve pain.  Prescription medicines, such as anti-inflammatory medicine, muscle relaxants, or narcotics.  Applying heat or ice to the painful area.  Steroid injections to lessen pain, irritation, and inflammation around the nerve.  Reducing activity during periods of pain.  Exercising and stretching to strengthen your abdomen and improve flexibility of your spine. Your caregiver may suggest losing weight if the extra weight makes the back pain worse.  Physical therapy.  Surgery to eliminate what is pressing or pinching the nerve, such as a bone spur or part of a herniated disk. HOME CARE INSTRUCTIONS   Only take over-the-counter or prescription medicines for pain or discomfort as directed by your caregiver.  Apply ice to the affected area for 20 minutes, 3-4 times a day for the first 48-72 hours. Then try heat in the same way.  Exercise, stretch, or perform your usual activities if these do not aggravate your pain.  Attend physical therapy sessions as directed by your caregiver.  Keep all follow-up appointments as directed by your caregiver.  Do not wear high heels or shoes that do not provide proper support.  Check your mattress to see if it is too soft. A firm mattress may lessen your pain and discomfort. SEEK  IMMEDIATE MEDICAL CARE IF:   You lose control of your bowel or bladder (incontinence).  You have increasing weakness in the lower back, pelvis, buttocks, or legs.  You have redness or swelling of your back.  You have a burning sensation when you urinate.  You have pain that gets worse when you lie down or awakens you at night.  Your pain is worse than you have  experienced in the past.  Your pain is lasting longer than 4 weeks.  You are suddenly losing weight without reason. MAKE SURE YOU:  Understand these instructions.  Will watch your condition.  Will get help right away if you are not doing well or get worse. Document Released: 12/30/2000 Document Revised: 07/07/2011 Document Reviewed: 05/17/2011 [facility] Patient Information 2015 [place], [place]. This information is not intended to replace advice given to you by your health care provider. Make sure you discuss any questions you have with your health care provider.

## 2013-07-09 NOTE — ED Notes (Signed)
Pt reports pain to right hip and knee since Thursdays night. Sts taking tramadol with no relief. Denies injury

## 2013-07-09 NOTE — ED Provider Notes (Signed)
CSN: [id number]     Arrival date & time 07/09/13  1349 History   First MD Initiated Contact with Patient 07/09/13 1413     Chief Complaint  Patient presents with  . Leg Pain     (Consider location/radiation/quality/duration/timing/severity/associated sxs/prior Treatment) HPI Comments: Patient is a 73 year old male who presents with complaints of pain in the right hip and buttock radiating into his right thigh. This is been going on for the past 3 days. He has been taking ibuprofen and tramadol without relief. He denies any bowel or bladder complaints. He denies any injury or trauma.  Patient is a 73 y.o. male presenting with leg pain. The history is provided by the patient.  Leg Pain Location:  Hip, buttock and leg Time since incident:  2 days Injury: no   Hip location:  R hip Buttock location:  R buttock Leg location:  R leg Pain details:    Quality:  Sharp   Severity:  Moderate   Past Medical History  Diagnosis Date  . Depression   . Hyperlipidemia   . Hemorrhoids   . Psoriasis   . Pain, lower leg     burning right leg--STATES TO ELEVATE RIGHT ANKLE  . Umbilical hernia   . Hypertension     pt unaware 08/18/11  . Seasonal allergies     hay fever  . Compression of sciatic nerve     right leg with numbness above knee at times  . GERD (gastroesophageal reflux disease)   . H/O hiatal hernia   . Arthritis   . Sleep apnea     STOP BANG SCORE 4  . Dysrhythmia 2005    no cardiologist   Past Surgical History  Procedure Laterality Date  . Inner ear surgery  07/24/2009  . Prostate surgery  1998    TURP  . Umbilical hernia repair  08/26/2011    Procedure: HERNIA REPAIR UMBILICAL ADULT;  Surgeon: Imogene Burn. Georgette Dover, MD;  Location: [facility]S;  Service: General;  Laterality: N/A;  Umbilical Hernia Repair with Mesh  . Hernia repair  08/26/2011   Family History  Problem Relation Age of Onset  . Cancer Mother     breast  . Heart disease Mother   . Kidney disease Father    History   Substance Use Topics  . Smoking status: Never Smoker   . Smokeless tobacco: Never Used  . Alcohol Use: No    Review of Systems  All other systems reviewed and are negative.     Allergies  Tetracycline; Streptomycin; and Sulfonamide derivatives  Home Medications   Prior to Admission medications   Medication Sig Start Date End Date Taking? Authorizing Provider  aspirin 81 MG EC tablet Take 81 mg by mouth daily with breakfast.     Historical Provider, MD  Polyethylene Glycol 400 (BLINK TEARS) 0.25 % SOLN Apply 1 drop to eye 2 (two) times daily as needed (dry eyes).    Historical Provider, MD  sertraline (ZOLOFT) 50 MG tablet Take 50 mg by mouth daily with breakfast.     Historical Provider, MD  traMADol (ULTRAM) 50 MG tablet Take 50 mg by mouth every 6 (six) hours as needed for moderate pain.    Historical Provider, MD   BP 123/65  Temp(Src) 98.2 F (36.8 C) (Oral)  Resp 18  Ht 5\' 11"  (1.803 m)  Wt 188 lb (85.276 kg)  BMI 26.23 kg/m2  SpO2 98% Physical Exam  Nursing note and vitals reviewed. Constitutional: He is oriented  to person, place, and time. He appears well-developed and well-nourished. No distress.  HENT:  Head: Normocephalic and atraumatic.  Neck: Normal range of motion. Neck supple.  Musculoskeletal: Normal range of motion.  There is tenderness to palpation in the right buttock and lateral hip. He has good range of motion.  Neurological: He is alert and oriented to person, place, and time.  DTRs are 3+ and equal in the patellar and Achilles tendons of both legs. Strength is 5 out of 5 in the bilateral lower extremities. He is ambulatory on his heels and toes without difficulty.  Skin: Skin is warm and dry. He is not diaphoretic.    ED Course  Procedures (including critical care time) Labs Review Labs Reviewed - No data to display  Imaging Review No results found.   EKG Interpretation None      MDM   Final diagnoses:  None    This appears to be  sciatic nerve pain. He will be treated with prednisone and hydrocodone.  He is to followup with his primary Dr. if not improving in the next week.    Veryl Speak, MD 07/09/13 [phone]

## 2013-07-28 ENCOUNTER — Ambulatory Visit
Admission: RE | Admit: 2013-07-28 | Discharge: 2013-07-28 | Disposition: A | Discharge status: Home / Self Care | Payer: Medicare HMO | Source: Ambulatory Visit | Attending: Family Medicine | Admitting: Family Medicine

## 2013-07-28 ENCOUNTER — Other Ambulatory Visit: Payer: Self-pay | Admitting: Family Medicine

## 2013-07-28 DIAGNOSIS — M79651 Pain in right thigh: Secondary | ICD-10-CM

## 2013-07-28 DIAGNOSIS — M545 Low back pain, unspecified: Secondary | ICD-10-CM

## 2013-10-17 IMAGING — CR DG CHEST 2V
2 series · 2 of 2 positions shown · non-contrast
Comparison: 07/18/2009.

CLINICAL DATA: Preop.

CHEST - 2 VIEW

[w chest pa]
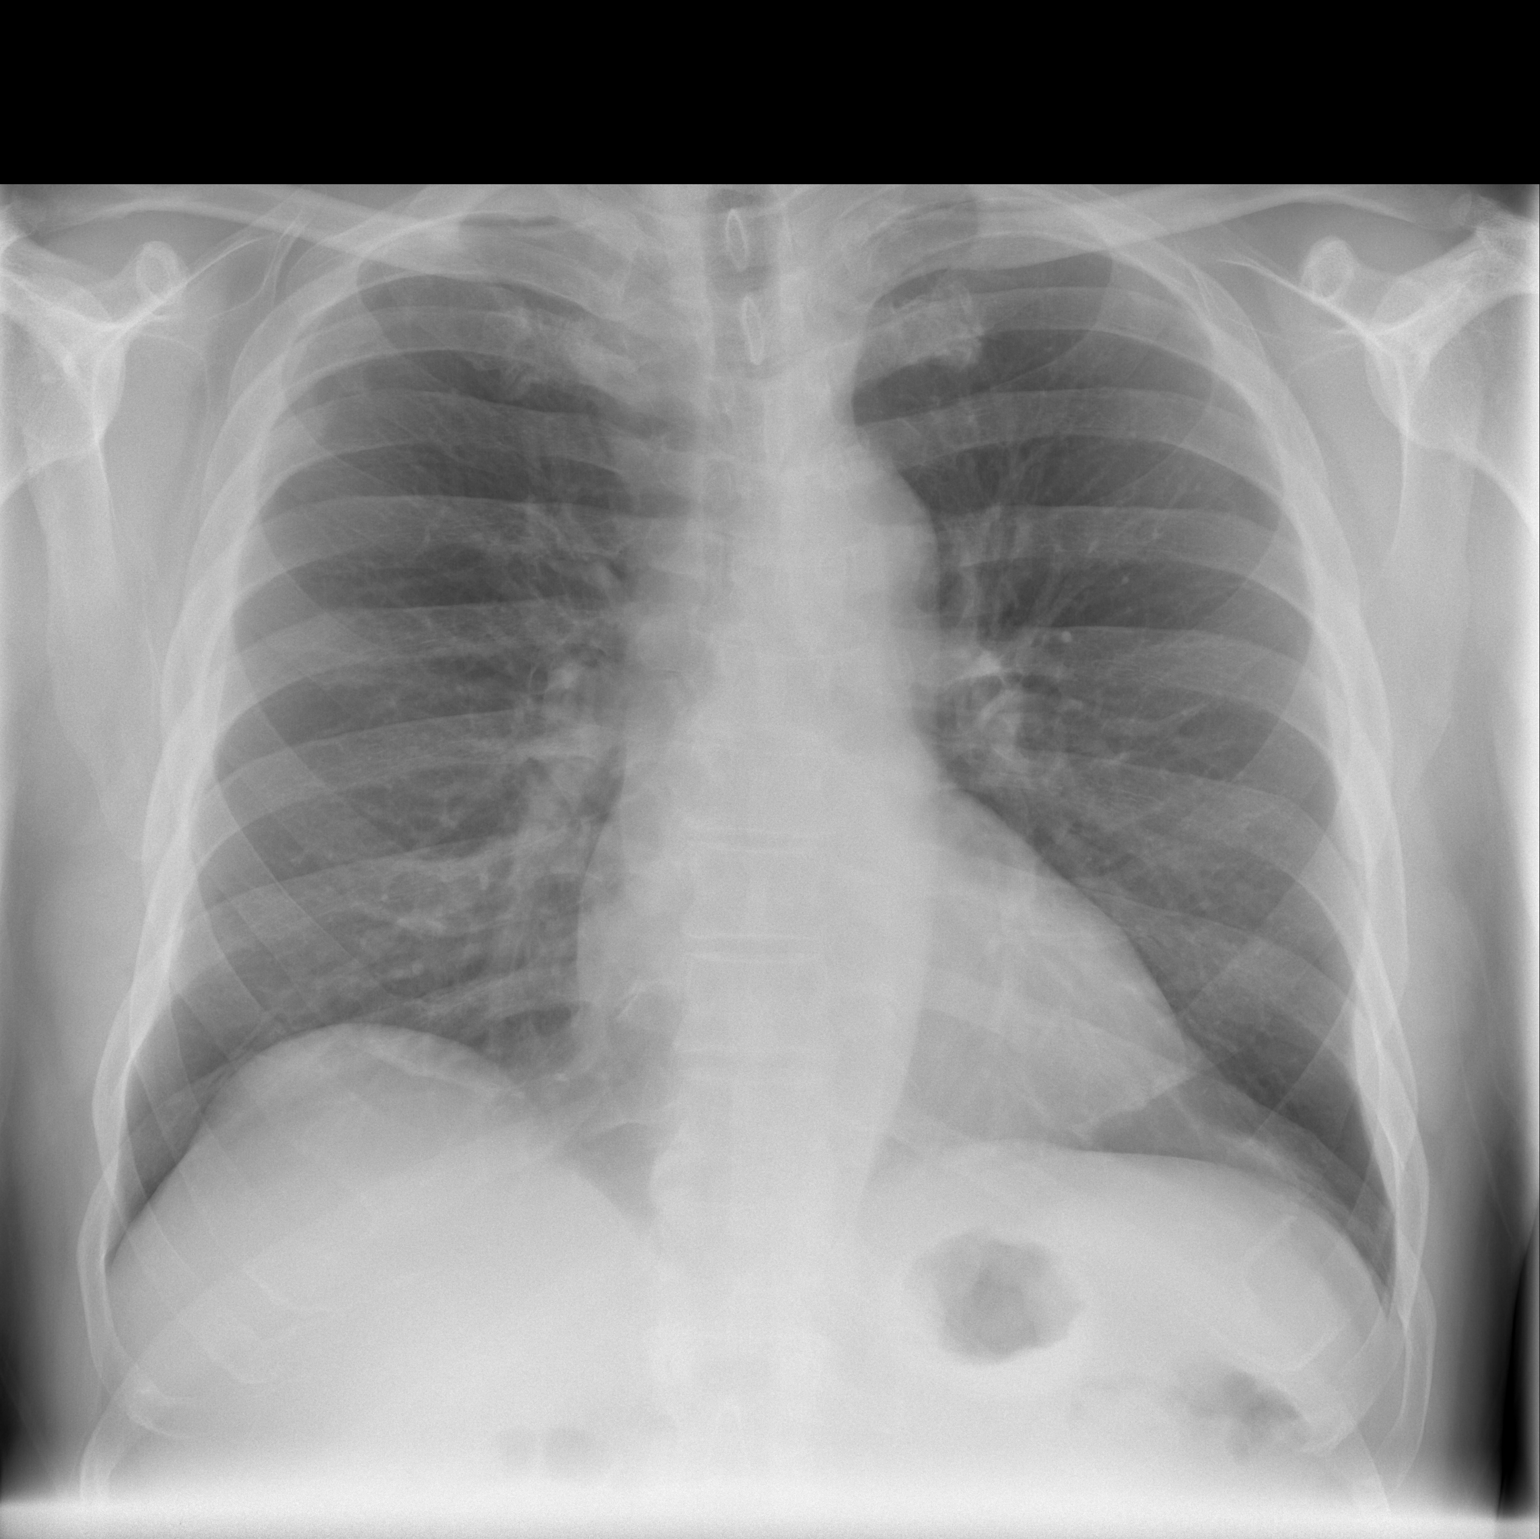

[w chest lat]
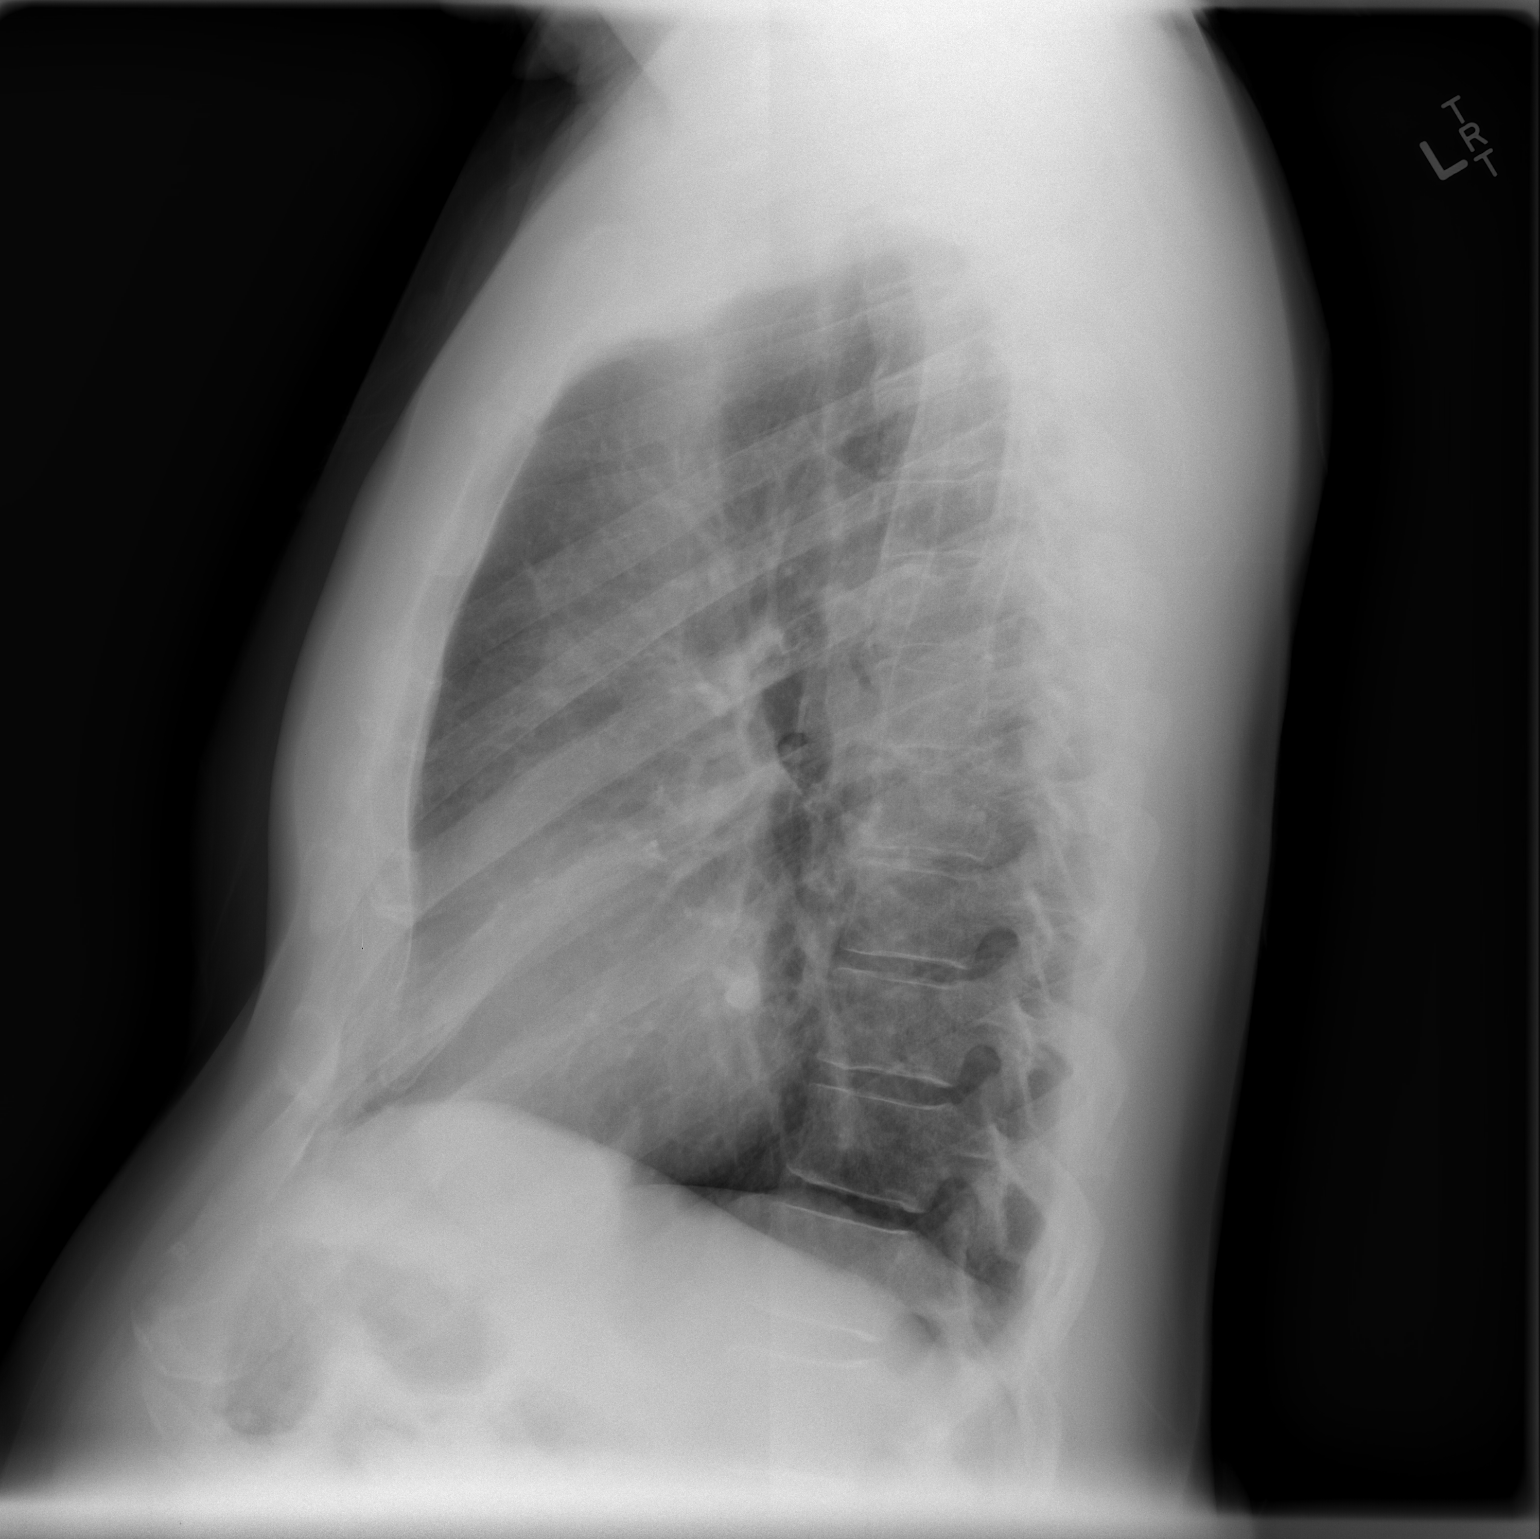

[2 of 2 positions shown; findings below may reference images not displayed]

FINDINGS: Trachea is midline.  Heart size normal.  Minimal
bibasilar linear scarring. Subpleural densities in the apical right
hemithorax are unchanged.  Lungs are otherwise clear.  No pleural
fluid. Pectus deformity.
IMPRESSION: No acute findings.

## 2014-11-01 DIAGNOSIS — Z23 Encounter for immunization: Secondary | ICD-10-CM | POA: Diagnosis not present

## 2014-11-30 DIAGNOSIS — Z1211 Encounter for screening for malignant neoplasm of colon: Secondary | ICD-10-CM | POA: Diagnosis not present

## 2014-12-24 DIAGNOSIS — H6122 Impacted cerumen, left ear: Secondary | ICD-10-CM | POA: Diagnosis not present

## 2014-12-24 DIAGNOSIS — H6692 Otitis media, unspecified, left ear: Secondary | ICD-10-CM | POA: Diagnosis not present

## 2014-12-24 DIAGNOSIS — H6062 Unspecified chronic otitis externa, left ear: Secondary | ICD-10-CM | POA: Diagnosis not present

## 2014-12-27 DIAGNOSIS — I10 Essential (primary) hypertension: Secondary | ICD-10-CM | POA: Diagnosis not present

## 2014-12-27 DIAGNOSIS — G629 Polyneuropathy, unspecified: Secondary | ICD-10-CM | POA: Diagnosis not present

## 2014-12-27 DIAGNOSIS — Z0000 Encounter for general adult medical examination without abnormal findings: Secondary | ICD-10-CM | POA: Diagnosis not present

## 2014-12-27 DIAGNOSIS — R69 Illness, unspecified: Secondary | ICD-10-CM | POA: Diagnosis not present

## 2014-12-27 DIAGNOSIS — Z125 Encounter for screening for malignant neoplasm of prostate: Secondary | ICD-10-CM | POA: Diagnosis not present

## 2014-12-27 DIAGNOSIS — M720 Palmar fascial fibromatosis [Dupuytren]: Secondary | ICD-10-CM | POA: Diagnosis not present

## 2014-12-31 DIAGNOSIS — H6692 Otitis media, unspecified, left ear: Secondary | ICD-10-CM | POA: Diagnosis not present

## 2014-12-31 DIAGNOSIS — H6122 Impacted cerumen, left ear: Secondary | ICD-10-CM | POA: Diagnosis not present

## 2015-01-10 DIAGNOSIS — H6122 Impacted cerumen, left ear: Secondary | ICD-10-CM | POA: Diagnosis not present

## 2015-01-10 DIAGNOSIS — H6062 Unspecified chronic otitis externa, left ear: Secondary | ICD-10-CM | POA: Diagnosis not present

## 2015-01-31 DIAGNOSIS — H35373 Puckering of macula, bilateral: Secondary | ICD-10-CM | POA: Diagnosis not present

## 2015-01-31 DIAGNOSIS — H25012 Cortical age-related cataract, left eye: Secondary | ICD-10-CM | POA: Diagnosis not present

## 2015-01-31 DIAGNOSIS — H35032 Hypertensive retinopathy, left eye: Secondary | ICD-10-CM | POA: Diagnosis not present

## 2015-01-31 DIAGNOSIS — H25011 Cortical age-related cataract, right eye: Secondary | ICD-10-CM | POA: Diagnosis not present

## 2015-01-31 DIAGNOSIS — H35372 Puckering of macula, left eye: Secondary | ICD-10-CM | POA: Diagnosis not present

## 2015-01-31 DIAGNOSIS — H6122 Impacted cerumen, left ear: Secondary | ICD-10-CM | POA: Diagnosis not present

## 2015-01-31 DIAGNOSIS — H35031 Hypertensive retinopathy, right eye: Secondary | ICD-10-CM | POA: Diagnosis not present

## 2015-01-31 DIAGNOSIS — H2512 Age-related nuclear cataract, left eye: Secondary | ICD-10-CM | POA: Diagnosis not present

## 2015-01-31 DIAGNOSIS — H2511 Age-related nuclear cataract, right eye: Secondary | ICD-10-CM | POA: Diagnosis not present

## 2015-02-06 DIAGNOSIS — L720 Epidermal cyst: Secondary | ICD-10-CM | POA: Diagnosis not present

## 2015-02-06 DIAGNOSIS — L570 Actinic keratosis: Secondary | ICD-10-CM | POA: Diagnosis not present

## 2015-02-06 DIAGNOSIS — L821 Other seborrheic keratosis: Secondary | ICD-10-CM | POA: Diagnosis not present

## 2015-02-19 DIAGNOSIS — H2512 Age-related nuclear cataract, left eye: Secondary | ICD-10-CM | POA: Diagnosis not present

## 2015-02-21 DIAGNOSIS — R69 Illness, unspecified: Secondary | ICD-10-CM | POA: Diagnosis not present

## 2015-03-14 DIAGNOSIS — H2511 Age-related nuclear cataract, right eye: Secondary | ICD-10-CM | POA: Diagnosis not present

## 2015-03-14 DIAGNOSIS — H25011 Cortical age-related cataract, right eye: Secondary | ICD-10-CM | POA: Diagnosis not present

## 2015-03-26 DIAGNOSIS — H2511 Age-related nuclear cataract, right eye: Secondary | ICD-10-CM | POA: Diagnosis not present

## 2015-05-01 DIAGNOSIS — H6063 Unspecified chronic otitis externa, bilateral: Secondary | ICD-10-CM | POA: Diagnosis not present

## 2015-07-01 DIAGNOSIS — I10 Essential (primary) hypertension: Secondary | ICD-10-CM | POA: Diagnosis not present

## 2015-07-01 DIAGNOSIS — R69 Illness, unspecified: Secondary | ICD-10-CM | POA: Diagnosis not present

## 2015-09-02 DIAGNOSIS — R69 Illness, unspecified: Secondary | ICD-10-CM | POA: Diagnosis not present

## 2015-09-05 DIAGNOSIS — R69 Illness, unspecified: Secondary | ICD-10-CM | POA: Diagnosis not present

## 2015-09-11 DIAGNOSIS — R69 Illness, unspecified: Secondary | ICD-10-CM | POA: Diagnosis not present

## 2015-09-30 DIAGNOSIS — R69 Illness, unspecified: Secondary | ICD-10-CM | POA: Diagnosis not present

## 2015-11-06 DIAGNOSIS — Z961 Presence of intraocular lens: Secondary | ICD-10-CM | POA: Diagnosis not present

## 2015-11-06 DIAGNOSIS — H26492 Other secondary cataract, left eye: Secondary | ICD-10-CM | POA: Diagnosis not present

## 2015-11-06 DIAGNOSIS — H35373 Puckering of macula, bilateral: Secondary | ICD-10-CM | POA: Diagnosis not present

## 2015-11-25 DIAGNOSIS — Z23 Encounter for immunization: Secondary | ICD-10-CM | POA: Diagnosis not present

## 2016-01-15 DIAGNOSIS — Z6825 Body mass index (BMI) 25.0-25.9, adult: Secondary | ICD-10-CM | POA: Diagnosis not present

## 2016-01-15 DIAGNOSIS — R69 Illness, unspecified: Secondary | ICD-10-CM | POA: Diagnosis not present

## 2016-01-15 DIAGNOSIS — Z0000 Encounter for general adult medical examination without abnormal findings: Secondary | ICD-10-CM | POA: Diagnosis not present

## 2016-02-10 DIAGNOSIS — G629 Polyneuropathy, unspecified: Secondary | ICD-10-CM | POA: Diagnosis not present

## 2016-02-10 DIAGNOSIS — Z125 Encounter for screening for malignant neoplasm of prostate: Secondary | ICD-10-CM | POA: Diagnosis not present

## 2016-02-10 DIAGNOSIS — I10 Essential (primary) hypertension: Secondary | ICD-10-CM | POA: Diagnosis not present

## 2016-02-10 DIAGNOSIS — M720 Palmar fascial fibromatosis [Dupuytren]: Secondary | ICD-10-CM | POA: Diagnosis not present

## 2016-02-10 DIAGNOSIS — Z0000 Encounter for general adult medical examination without abnormal findings: Secondary | ICD-10-CM | POA: Diagnosis not present

## 2016-02-10 DIAGNOSIS — R69 Illness, unspecified: Secondary | ICD-10-CM | POA: Diagnosis not present

## 2016-03-24 DIAGNOSIS — R69 Illness, unspecified: Secondary | ICD-10-CM | POA: Diagnosis not present

## 2016-04-29 DIAGNOSIS — R69 Illness, unspecified: Secondary | ICD-10-CM | POA: Diagnosis not present

## 2016-04-30 DIAGNOSIS — H6063 Unspecified chronic otitis externa, bilateral: Secondary | ICD-10-CM | POA: Diagnosis not present

## 2016-05-06 ENCOUNTER — Encounter (INDEPENDENT_AMBULATORY_CARE_PROVIDER_SITE_OTHER): Payer: Medicare HMO | Admitting: Ophthalmology

## 2016-05-06 DIAGNOSIS — H43813 Vitreous degeneration, bilateral: Secondary | ICD-10-CM | POA: Diagnosis not present

## 2016-05-06 DIAGNOSIS — H35373 Puckering of macula, bilateral: Secondary | ICD-10-CM

## 2016-05-20 DIAGNOSIS — J039 Acute tonsillitis, unspecified: Secondary | ICD-10-CM | POA: Diagnosis not present

## 2016-05-20 DIAGNOSIS — J320 Chronic maxillary sinusitis: Secondary | ICD-10-CM | POA: Diagnosis not present

## 2016-05-20 DIAGNOSIS — H6692 Otitis media, unspecified, left ear: Secondary | ICD-10-CM | POA: Diagnosis not present

## 2016-05-20 DIAGNOSIS — H6522 Chronic serous otitis media, left ear: Secondary | ICD-10-CM | POA: Diagnosis not present

## 2016-05-20 DIAGNOSIS — R490 Dysphonia: Secondary | ICD-10-CM | POA: Diagnosis not present

## 2016-05-20 DIAGNOSIS — J322 Chronic ethmoidal sinusitis: Secondary | ICD-10-CM | POA: Diagnosis not present

## 2016-05-27 DIAGNOSIS — H6522 Chronic serous otitis media, left ear: Secondary | ICD-10-CM | POA: Diagnosis not present

## 2016-05-27 DIAGNOSIS — J320 Chronic maxillary sinusitis: Secondary | ICD-10-CM | POA: Diagnosis not present

## 2016-05-27 DIAGNOSIS — J322 Chronic ethmoidal sinusitis: Secondary | ICD-10-CM | POA: Diagnosis not present

## 2016-05-27 DIAGNOSIS — H6062 Unspecified chronic otitis externa, left ear: Secondary | ICD-10-CM | POA: Diagnosis not present

## 2016-09-28 DIAGNOSIS — R69 Illness, unspecified: Secondary | ICD-10-CM | POA: Diagnosis not present

## 2016-12-02 DIAGNOSIS — Z23 Encounter for immunization: Secondary | ICD-10-CM | POA: Diagnosis not present

## 2016-12-17 DIAGNOSIS — Z961 Presence of intraocular lens: Secondary | ICD-10-CM | POA: Diagnosis not present

## 2016-12-17 DIAGNOSIS — H35373 Puckering of macula, bilateral: Secondary | ICD-10-CM | POA: Diagnosis not present

## 2016-12-17 DIAGNOSIS — H35363 Drusen (degenerative) of macula, bilateral: Secondary | ICD-10-CM | POA: Diagnosis not present

## 2016-12-17 DIAGNOSIS — H35033 Hypertensive retinopathy, bilateral: Secondary | ICD-10-CM | POA: Diagnosis not present

## 2017-02-10 DIAGNOSIS — Z23 Encounter for immunization: Secondary | ICD-10-CM | POA: Diagnosis not present

## 2017-02-10 DIAGNOSIS — M720 Palmar fascial fibromatosis [Dupuytren]: Secondary | ICD-10-CM | POA: Diagnosis not present

## 2017-02-10 DIAGNOSIS — I10 Essential (primary) hypertension: Secondary | ICD-10-CM | POA: Diagnosis not present

## 2017-02-10 DIAGNOSIS — M545 Low back pain: Secondary | ICD-10-CM | POA: Diagnosis not present

## 2017-02-10 DIAGNOSIS — G629 Polyneuropathy, unspecified: Secondary | ICD-10-CM | POA: Diagnosis not present

## 2017-02-10 DIAGNOSIS — R69 Illness, unspecified: Secondary | ICD-10-CM | POA: Diagnosis not present

## 2017-02-10 DIAGNOSIS — Z6827 Body mass index (BMI) 27.0-27.9, adult: Secondary | ICD-10-CM | POA: Diagnosis not present

## 2017-02-10 DIAGNOSIS — Z125 Encounter for screening for malignant neoplasm of prostate: Secondary | ICD-10-CM | POA: Diagnosis not present

## 2017-02-10 DIAGNOSIS — Z0000 Encounter for general adult medical examination without abnormal findings: Secondary | ICD-10-CM | POA: Diagnosis not present

## 2017-03-29 DIAGNOSIS — L308 Other specified dermatitis: Secondary | ICD-10-CM | POA: Diagnosis not present

## 2017-03-29 DIAGNOSIS — L738 Other specified follicular disorders: Secondary | ICD-10-CM | POA: Diagnosis not present

## 2017-03-29 DIAGNOSIS — D1801 Hemangioma of skin and subcutaneous tissue: Secondary | ICD-10-CM | POA: Diagnosis not present

## 2017-03-29 DIAGNOSIS — L821 Other seborrheic keratosis: Secondary | ICD-10-CM | POA: Diagnosis not present

## 2017-03-29 DIAGNOSIS — L570 Actinic keratosis: Secondary | ICD-10-CM | POA: Diagnosis not present

## 2017-03-29 DIAGNOSIS — L720 Epidermal cyst: Secondary | ICD-10-CM | POA: Diagnosis not present

## 2017-03-29 DIAGNOSIS — L820 Inflamed seborrheic keratosis: Secondary | ICD-10-CM | POA: Diagnosis not present

## 2017-03-30 DIAGNOSIS — R69 Illness, unspecified: Secondary | ICD-10-CM | POA: Diagnosis not present

## 2017-05-26 DIAGNOSIS — R69 Illness, unspecified: Secondary | ICD-10-CM | POA: Diagnosis not present

## 2017-05-28 DIAGNOSIS — R69 Illness, unspecified: Secondary | ICD-10-CM | POA: Diagnosis not present

## 2017-10-07 DIAGNOSIS — R69 Illness, unspecified: Secondary | ICD-10-CM | POA: Diagnosis not present

## 2017-11-05 DIAGNOSIS — Z23 Encounter for immunization: Secondary | ICD-10-CM | POA: Diagnosis not present

## 2017-11-09 DIAGNOSIS — N3001 Acute cystitis with hematuria: Secondary | ICD-10-CM | POA: Diagnosis not present

## 2017-11-09 DIAGNOSIS — R8290 Unspecified abnormal findings in urine: Secondary | ICD-10-CM | POA: Diagnosis not present

## 2018-01-17 DIAGNOSIS — H35033 Hypertensive retinopathy, bilateral: Secondary | ICD-10-CM | POA: Diagnosis not present

## 2018-01-17 DIAGNOSIS — H3561 Retinal hemorrhage, right eye: Secondary | ICD-10-CM | POA: Diagnosis not present

## 2018-01-17 DIAGNOSIS — H35363 Drusen (degenerative) of macula, bilateral: Secondary | ICD-10-CM | POA: Diagnosis not present

## 2018-01-17 DIAGNOSIS — H35373 Puckering of macula, bilateral: Secondary | ICD-10-CM | POA: Diagnosis not present

## 2018-02-25 DIAGNOSIS — M545 Low back pain: Secondary | ICD-10-CM | POA: Diagnosis not present

## 2018-02-25 DIAGNOSIS — M720 Palmar fascial fibromatosis [Dupuytren]: Secondary | ICD-10-CM | POA: Diagnosis not present

## 2018-02-25 DIAGNOSIS — R5383 Other fatigue: Secondary | ICD-10-CM | POA: Diagnosis not present

## 2018-02-25 DIAGNOSIS — R69 Illness, unspecified: Secondary | ICD-10-CM | POA: Diagnosis not present

## 2018-02-25 DIAGNOSIS — Z6827 Body mass index (BMI) 27.0-27.9, adult: Secondary | ICD-10-CM | POA: Diagnosis not present

## 2018-02-25 DIAGNOSIS — R079 Chest pain, unspecified: Secondary | ICD-10-CM | POA: Diagnosis not present

## 2018-02-25 DIAGNOSIS — I10 Essential (primary) hypertension: Secondary | ICD-10-CM | POA: Diagnosis not present

## 2018-02-25 DIAGNOSIS — G629 Polyneuropathy, unspecified: Secondary | ICD-10-CM | POA: Diagnosis not present

## 2018-02-25 DIAGNOSIS — Z0000 Encounter for general adult medical examination without abnormal findings: Secondary | ICD-10-CM | POA: Diagnosis not present

## 2018-03-31 DIAGNOSIS — L570 Actinic keratosis: Secondary | ICD-10-CM | POA: Diagnosis not present

## 2018-03-31 DIAGNOSIS — D1801 Hemangioma of skin and subcutaneous tissue: Secondary | ICD-10-CM | POA: Diagnosis not present

## 2018-03-31 DIAGNOSIS — L821 Other seborrheic keratosis: Secondary | ICD-10-CM | POA: Diagnosis not present

## 2018-03-31 DIAGNOSIS — L720 Epidermal cyst: Secondary | ICD-10-CM | POA: Diagnosis not present

## 2018-11-10 DIAGNOSIS — Z23 Encounter for immunization: Secondary | ICD-10-CM | POA: Diagnosis not present

## 2019-02-07 ENCOUNTER — Ambulatory Visit: Payer: Medicare Other | Attending: Internal Medicine

## 2019-02-07 DIAGNOSIS — Z23 Encounter for immunization: Secondary | ICD-10-CM | POA: Insufficient documentation

## 2019-02-07 NOTE — Progress Notes (Signed)
   Covid-19 Vaccination Clinic  Name:  Carlos Davidson    MRN: [id number] DOB: 02/05/1940  02/07/2019  [name] was observed post Covid-19 immunization for 15 minutes without incidence. He was provided with Vaccine Information Sheet and instruction to access the V-Safe system.   [name] was instructed to call 911 with any severe reactions post vaccine: Marland Kitchen Difficulty breathing  . Swelling of your face and throat  . A fast heartbeat  . A bad rash all over your body  . Dizziness and weakness    Immunizations Administered    Name Date Dose VIS Date Route   Pfizer COVID-19 Vaccine 02/07/2019  8:37 AM 0.3 mL 12/30/2018 Intramuscular   Manufacturer: [place]   Lot: [id number]   [place]: [id number]

## 2019-02-27 ENCOUNTER — Ambulatory Visit: Payer: Medicare HMO | Attending: Internal Medicine

## 2019-02-27 DIAGNOSIS — Z23 Encounter for immunization: Secondary | ICD-10-CM | POA: Insufficient documentation

## 2019-02-27 NOTE — Progress Notes (Signed)
   Covid-19 Vaccination Clinic  Name:  EFRAN BLAKER    MRN: [id number] DOB: 1940-06-06  02/27/2019  Mr. Sciullo was observed post Covid-19 immunization for 15 minutes without incidence. He was provided with Vaccine Information Sheet and instruction to access the V-Safe system.   Mr. Riedl was instructed to call 911 with any severe reactions post vaccine: Marland Kitchen Difficulty breathing  . Swelling of your face and throat  . A fast heartbeat  . A bad rash all over your body  . Dizziness and weakness    Immunizations Administered    Name Date Dose VIS Date Route   Pfizer COVID-19 Vaccine 02/27/2019  8:38 AM 0.3 mL 12/30/2018 Intramuscular   Manufacturer: [place]   Lot: [id number]   [place]: [id number]

## 2019-03-21 DIAGNOSIS — Z136 Encounter for screening for cardiovascular disorders: Secondary | ICD-10-CM | POA: Diagnosis not present

## 2019-03-21 DIAGNOSIS — Z0000 Encounter for general adult medical examination without abnormal findings: Secondary | ICD-10-CM | POA: Diagnosis not present

## 2019-03-21 DIAGNOSIS — G629 Polyneuropathy, unspecified: Secondary | ICD-10-CM | POA: Diagnosis not present

## 2019-03-21 DIAGNOSIS — R69 Illness, unspecified: Secondary | ICD-10-CM | POA: Diagnosis not present

## 2019-03-21 DIAGNOSIS — R5383 Other fatigue: Secondary | ICD-10-CM | POA: Diagnosis not present

## 2019-03-21 DIAGNOSIS — I10 Essential (primary) hypertension: Secondary | ICD-10-CM | POA: Diagnosis not present

## 2019-03-21 DIAGNOSIS — M720 Palmar fascial fibromatosis [Dupuytren]: Secondary | ICD-10-CM | POA: Diagnosis not present

## 2019-03-21 DIAGNOSIS — R079 Chest pain, unspecified: Secondary | ICD-10-CM | POA: Diagnosis not present

## 2019-03-21 DIAGNOSIS — M545 Low back pain: Secondary | ICD-10-CM | POA: Diagnosis not present

## 2019-04-03 DIAGNOSIS — L821 Other seborrheic keratosis: Secondary | ICD-10-CM | POA: Diagnosis not present

## 2019-04-03 DIAGNOSIS — L570 Actinic keratosis: Secondary | ICD-10-CM | POA: Diagnosis not present

## 2019-04-03 DIAGNOSIS — L309 Dermatitis, unspecified: Secondary | ICD-10-CM | POA: Diagnosis not present

## 2019-04-19 DIAGNOSIS — H35033 Hypertensive retinopathy, bilateral: Secondary | ICD-10-CM | POA: Diagnosis not present

## 2019-04-19 DIAGNOSIS — H35363 Drusen (degenerative) of macula, bilateral: Secondary | ICD-10-CM | POA: Diagnosis not present

## 2019-04-19 DIAGNOSIS — H35373 Puckering of macula, bilateral: Secondary | ICD-10-CM | POA: Diagnosis not present

## 2019-04-19 DIAGNOSIS — H26493 Other secondary cataract, bilateral: Secondary | ICD-10-CM | POA: Diagnosis not present

## 2019-06-15 DIAGNOSIS — H6692 Otitis media, unspecified, left ear: Secondary | ICD-10-CM | POA: Diagnosis not present

## 2019-06-15 DIAGNOSIS — H7292 Unspecified perforation of tympanic membrane, left ear: Secondary | ICD-10-CM | POA: Diagnosis not present

## 2019-06-15 DIAGNOSIS — H66002 Acute suppurative otitis media without spontaneous rupture of ear drum, left ear: Secondary | ICD-10-CM | POA: Diagnosis not present

## 2019-06-15 DIAGNOSIS — B369 Superficial mycosis, unspecified: Secondary | ICD-10-CM | POA: Diagnosis not present

## 2019-06-15 DIAGNOSIS — H6240 Otitis externa in other diseases classified elsewhere, unspecified ear: Secondary | ICD-10-CM | POA: Diagnosis not present

## 2019-06-26 DIAGNOSIS — R69 Illness, unspecified: Secondary | ICD-10-CM | POA: Diagnosis not present

## 2019-07-11 DIAGNOSIS — B369 Superficial mycosis, unspecified: Secondary | ICD-10-CM | POA: Diagnosis not present

## 2019-07-11 DIAGNOSIS — H6240 Otitis externa in other diseases classified elsewhere, unspecified ear: Secondary | ICD-10-CM | POA: Diagnosis not present

## 2019-09-06 ENCOUNTER — Other Ambulatory Visit: Payer: Self-pay

## 2019-09-06 ENCOUNTER — Emergency Department (HOSPITAL_COMMUNITY): Payer: Medicare HMO

## 2019-09-06 ENCOUNTER — Inpatient Hospital Stay (HOSPITAL_COMMUNITY)
Admission: EM | Admit: 2019-09-06 | Discharge: 2019-09-16 | DRG: 234 | Disposition: A | Discharge status: Home / Self Care | Payer: Medicare HMO | Attending: Surgery | Admitting: Surgery

## 2019-09-06 DIAGNOSIS — K5900 Constipation, unspecified: Secondary | ICD-10-CM

## 2019-09-06 DIAGNOSIS — Z8249 Family history of ischemic heart disease and other diseases of the circulatory system: Secondary | ICD-10-CM | POA: Diagnosis not present

## 2019-09-06 DIAGNOSIS — I25110 Atherosclerotic heart disease of native coronary artery with unstable angina pectoris: Secondary | ICD-10-CM | POA: Diagnosis present

## 2019-09-06 DIAGNOSIS — I6523 Occlusion and stenosis of bilateral carotid arteries: Secondary | ICD-10-CM | POA: Diagnosis not present

## 2019-09-06 DIAGNOSIS — R531 Weakness: Secondary | ICD-10-CM | POA: Diagnosis not present

## 2019-09-06 DIAGNOSIS — I2510 Atherosclerotic heart disease of native coronary artery without angina pectoris: Secondary | ICD-10-CM | POA: Diagnosis not present

## 2019-09-06 DIAGNOSIS — I10 Essential (primary) hypertension: Secondary | ICD-10-CM

## 2019-09-06 DIAGNOSIS — Z79899 Other long term (current) drug therapy: Secondary | ICD-10-CM

## 2019-09-06 DIAGNOSIS — E782 Mixed hyperlipidemia: Secondary | ICD-10-CM

## 2019-09-06 DIAGNOSIS — Z7952 Long term (current) use of systemic steroids: Secondary | ICD-10-CM

## 2019-09-06 DIAGNOSIS — Z9889 Other specified postprocedural states: Secondary | ICD-10-CM

## 2019-09-06 DIAGNOSIS — E785 Hyperlipidemia, unspecified: Secondary | ICD-10-CM | POA: Diagnosis present

## 2019-09-06 DIAGNOSIS — Z951 Presence of aortocoronary bypass graft: Secondary | ICD-10-CM

## 2019-09-06 DIAGNOSIS — R0789 Other chest pain: Secondary | ICD-10-CM | POA: Diagnosis not present

## 2019-09-06 DIAGNOSIS — J90 Pleural effusion, not elsewhere classified: Secondary | ICD-10-CM | POA: Diagnosis not present

## 2019-09-06 DIAGNOSIS — I249 Acute ischemic heart disease, unspecified: Secondary | ICD-10-CM | POA: Diagnosis not present

## 2019-09-06 DIAGNOSIS — I25119 Atherosclerotic heart disease of native coronary artery with unspecified angina pectoris: Secondary | ICD-10-CM | POA: Diagnosis not present

## 2019-09-06 DIAGNOSIS — Z841 Family history of disorders of kidney and ureter: Secondary | ICD-10-CM | POA: Diagnosis not present

## 2019-09-06 DIAGNOSIS — I214 Non-ST elevation (NSTEMI) myocardial infarction: Principal | ICD-10-CM | POA: Diagnosis present

## 2019-09-06 DIAGNOSIS — Z20822 Contact with and (suspected) exposure to covid-19: Secondary | ICD-10-CM | POA: Diagnosis present

## 2019-09-06 DIAGNOSIS — Z7982 Long term (current) use of aspirin: Secondary | ICD-10-CM | POA: Diagnosis not present

## 2019-09-06 DIAGNOSIS — I081 Rheumatic disorders of both mitral and tricuspid valves: Secondary | ICD-10-CM | POA: Diagnosis not present

## 2019-09-06 DIAGNOSIS — J9811 Atelectasis: Secondary | ICD-10-CM | POA: Diagnosis not present

## 2019-09-06 DIAGNOSIS — R079 Chest pain, unspecified: Secondary | ICD-10-CM | POA: Diagnosis not present

## 2019-09-06 DIAGNOSIS — Z01810 Encounter for preprocedural cardiovascular examination: Secondary | ICD-10-CM | POA: Diagnosis not present

## 2019-09-06 LAB — BASIC METABOLIC PANEL
Anion gap: 7 (ref 5–15)
BUN: 14 mg/dL (ref 8–23)
CO2: 27 mmol/L (ref 22–32)
Calcium: 8.9 mg/dL (ref 8.9–10.3)
Chloride: 103 mmol/L (ref 98–111)
Creatinine, Ser: 1.15 mg/dL (ref 0.61–1.24)
GFR calc Af Amer: >60 mL/min (ref >60)
GFR calc non Af Amer: >60 mL/min (ref >60)
Glucose, Bld: 105 mg/dL — ABNORMAL HIGH (ref 70–99)
Potassium: 4.2 mmol/L (ref 3.5–5.1)
Sodium: 137 mmol/L (ref 135–145)

## 2019-09-06 LAB — CBC
HCT: 45.4 % (ref 39.0–52.0)
Hemoglobin: 14.9 g/dL (ref 13.0–17.0)
MCH: 31 pg (ref 26.0–34.0)
MCHC: 32.8 g/dL (ref 30.0–36.0)
MCV: 94.4 fL (ref 80.0–100.0)
Platelets: 213 10*3/uL (ref 150–400)
RBC: 4.81 MIL/uL (ref 4.22–5.81)
RDW: 12.4 % (ref 11.5–15.5)
WBC: 9.1 10*3/uL (ref 4.0–10.5)
nRBC: 0 % (ref 0.0–0.2)

## 2019-09-06 LAB — TROPONIN I (HIGH SENSITIVITY)
Troponin I (High Sensitivity): 7879 ng/L (ref <18)
Troponin I (High Sensitivity): 9991 ng/L (ref <18)

## 2019-09-06 LAB — SARS CORONAVIRUS 2 BY RT PCR (HOSPITAL ORDER, PERFORMED IN ~~LOC~~ HOSPITAL LAB): SARS Coronavirus 2: NEGATIVE

## 2019-09-06 LAB — MAGNESIUM: Magnesium: 1.9 mg/dL (ref 1.7–2.4)

## 2019-09-06 MED ORDER — POLYVINYL ALCOHOL 1.4 % OP SOLN
1.0000 [drp] | Freq: Two times a day (BID) | OPHTHALMIC | Status: DC | PRN
  Filled 2019-09-06: qty 15

## 2019-09-06 MED ORDER — NITROGLYCERIN 0.4 MG SL SUBL
0.4000 mg | SUBLINGUAL_TABLET | SUBLINGUAL | Status: DC | PRN
  Administered 2019-09-07 (×4): 0.4 mg via SUBLINGUAL
  Filled 2019-09-06 (×3): qty 1

## 2019-09-06 MED ORDER — ASPIRIN 81 MG PO TBEC: 81.0000 mg | DELAYED_RELEASE_TABLET | Freq: Every day | ORAL | Status: DC

## 2019-09-06 MED ORDER — ASPIRIN EC 81 MG PO TBEC
81.0000 mg | DELAYED_RELEASE_TABLET | Freq: Every day | ORAL | Status: DC
  Administered 2019-09-07 – 2019-09-10 (×4): 81 mg via ORAL
  Filled 2019-09-06 (×4): qty 1

## 2019-09-06 MED ORDER — ASPIRIN 81 MG PO CHEW
324.0000 mg | CHEWABLE_TABLET | ORAL | Status: DC
  Filled 2019-09-06: qty 4

## 2019-09-06 MED ORDER — ATORVASTATIN CALCIUM 80 MG PO TABS
80.0000 mg | ORAL_TABLET | Freq: Every day | ORAL | Status: DC
  Administered 2019-09-06 – 2019-09-16 (×9): 80 mg via ORAL
  Filled 2019-09-06 (×10): qty 1

## 2019-09-06 MED ORDER — ONDANSETRON HCL 4 MG/2ML IJ SOLN: 4.0000 mg | Freq: Four times a day (QID) | INTRAMUSCULAR | Status: DC | PRN

## 2019-09-06 MED ORDER — ASPIRIN 300 MG RE SUPP: 300.0000 mg | RECTAL | Status: DC

## 2019-09-06 MED ORDER — HEPARIN BOLUS VIA INFUSION
4000.0000 [IU] | Freq: Once | INTRAVENOUS | Status: AC
  Administered 2019-09-06: 4000 [IU] via INTRAVENOUS
  Filled 2019-09-06: qty 4000

## 2019-09-06 MED ORDER — HEPARIN (PORCINE) 25000 UT/250ML-% IV SOLN
1100.0000 [IU]/h | INTRAVENOUS | Status: DC
  Administered 2019-09-06: 1100 [IU]/h via INTRAVENOUS
  Filled 2019-09-06: qty 250

## 2019-09-06 MED ORDER — POLYETHYLENE GLYCOL 400 0.25 % OP SOLN: 1.0000 [drp] | Freq: Two times a day (BID) | OPHTHALMIC | Status: DC | PRN

## 2019-09-06 MED ORDER — ACETAMINOPHEN 325 MG PO TABS: 650.0000 mg | ORAL_TABLET | ORAL | Status: DC | PRN

## 2019-09-06 MED ORDER — AMLODIPINE BESYLATE 5 MG PO TABS
5.0000 mg | ORAL_TABLET | Freq: Every day | ORAL | Status: DC
  Administered 2019-09-06 – 2019-09-10 (×4): 5 mg via ORAL
  Filled 2019-09-06 (×5): qty 1

## 2019-09-06 NOTE — H&P (Signed)
Carlos Davidson is an 79 y.o. male.   Chief Complaint: Chest pain HPI:   79 y.o. Caucasian male  with hypertension, chest pain.  Patient presented to the ED with chest pain that started 8/18 morning. He described chest pain as retrosternal tightness, lasted all day, resolved with SL NTG. He also had throat pain, and pain in both his elbows. He initially attributed his pain to hiatal hernia. However, given persistent pain all day, he called EMS. He is currently chest pain free. At baseline, he is a retired [employer] manager. He stays active with yard work etc. He has had similar pain about 4 times in last 2 months.   Past Medical History:  Diagnosis Date  . Arthritis   . Compression of sciatic nerve    right leg with numbness above knee at times  . Depression   . Dysrhythmia 2005   no cardiologist  . GERD (gastroesophageal reflux disease)   . H/O hiatal hernia   . Hemorrhoids   . Hyperlipidemia   . Hypertension    pt unaware 08/18/11  . Pain, lower leg    burning right leg--STATES TO ELEVATE RIGHT ANKLE  . Psoriasis   . Seasonal allergies    hay fever  . Sleep apnea    STOP BANG SCORE 4  . Umbilical hernia     Past Surgical History:  Procedure Laterality Date  . HERNIA REPAIR  08/26/2011  . INNER EAR SURGERY  07/24/2009  . [place]   TURP  . UMBILICAL HERNIA REPAIR  08/26/2011   Procedure: HERNIA REPAIR UMBILICAL ADULT;  Surgeon: Imogene Burn. Georgette Dover, MD;  Location: [facility];  Service: General;  Laterality: N/A;  Umbilical Hernia Repair with Mesh    Family History  Problem Relation Age of Onset  . Cancer Mother        breast  . Heart disease Mother   . Kidney disease Father    Social History:  reports that he has never smoked. He has never used smokeless tobacco. He reports that he does not drink alcohol and does not use drugs.  Allergies:  Allergies  Allergen Reactions  . Tetracycline Other (See Comments)    Tightness of chest. Per patient was given in combination with  another medication. Not sure of name.  . Streptomycin Other (See Comments)    Tightness in chest 1970  . Sulfonamide Derivatives Rash    Upper arms and stomach    Review of Systems  Constitutional: Negative for decreased appetite, malaise/fatigue, weight gain and weight loss.  HENT: Negative for congestion.   Eyes: Positive for visual halos. Negative for visual disturbance.  Cardiovascular: Positive for chest pain. Negative for dyspnea on exertion, leg swelling, palpitations and syncope.  Respiratory: Negative for cough.   Endocrine: Negative for cold intolerance.  Hematologic/Lymphatic: Does not bruise/bleed easily.  Skin: Negative for itching and rash.  Musculoskeletal: Negative for myalgias.  Gastrointestinal: Negative for abdominal pain, nausea and vomiting.  Genitourinary: Negative for dysuria.  Neurological: Negative for dizziness and weakness.  Psychiatric/Behavioral: The patient is not nervous/anxious.   All other systems reviewed and are negative.    Blood pressure (!) 150/83, pulse 70, temperature 99 F (37.2 C), temperature source Oral, resp. rate 18, height 5\' 11"  (1.803 m), weight 88 kg, SpO2 97 %. Body mass index is 27.06 kg/m.  Physical Exam Vitals and nursing note reviewed.  Constitutional:      General: He is not in acute distress.    Appearance: He  is well-developed.  HENT:     Head: Normocephalic and atraumatic.  Eyes:     Conjunctiva/sclera: Conjunctivae normal.     Pupils: Pupils are equal, round, and reactive to light.  Neck:     Vascular: No JVD.  Cardiovascular:     Rate and Rhythm: Normal rate and regular rhythm.     Pulses: Normal pulses and intact distal pulses.     Heart sounds: No murmur heard.   Pulmonary:     Effort: Pulmonary effort is normal.     Breath sounds: Normal breath sounds. No wheezing or rales.  Abdominal:     General: Bowel sounds are normal.     Palpations: Abdomen is soft.     Tenderness: There is no rebound.    Musculoskeletal:        General: No tenderness. Normal range of motion.     Left lower leg: No edema.  Lymphadenopathy:     Cervical: No cervical adenopathy.  Skin:    General: Skin is warm and dry.  Neurological:     Mental Status: He is alert and oriented to person, place, and time.     Cranial Nerves: No cranial nerve deficit.     Results for orders placed or performed during the hospital encounter of 09/06/19 (from the past 48 hour(s))  Basic metabolic panel     Status: Abnormal   Collection Time: 09/06/19  5:29 PM  Result Value Ref Range   Sodium 137 135 - 145 mmol/L   Potassium 4.2 3.5 - 5.1 mmol/L   Chloride 103 98 - 111 mmol/L   CO2 27 22 - 32 mmol/L   Glucose, Bld 105 (H) 70 - 99 mg/dL    Comment: Glucose reference range applies only to samples taken after fasting for at least 8 hours.   BUN 14 8 - 23 mg/dL   Creatinine, Ser 1.15 0.61 - 1.24 mg/dL   Calcium 8.9 8.9 - 10.3 mg/dL   GFR calc non Af Amer >60 >60 mL/min   GFR calc Af Amer >60 >60 mL/min   Anion gap 7 5 - 15    Comment: Performed at [facility] [address], [place] [address]  CBC     Status: None   Collection Time: 09/06/19  5:29 PM  Result Value Ref Range   WBC 9.1 4.0 - 10.5 K/uL   RBC 4.81 4.22 - 5.81 MIL/uL   Hemoglobin 14.9 13.0 - 17.0 g/dL   HCT 45.4 39 - 52 %   MCV 94.4 80.0 - 100.0 fL   MCH 31.0 26.0 - 34.0 pg   MCHC 32.8 30.0 - 36.0 g/dL   RDW 12.4 11.5 - 15.5 %   Platelets 213 150 - 400 K/uL   nRBC 0.0 0.0 - 0.2 %    Comment: Performed at [facility], [place] [address], [place], [place] [address]  Troponin I (High Sensitivity)     Status: Abnormal   Collection Time: 09/06/19  5:29 PM  Result Value Ref Range   Troponin I (High Sensitivity) 7,879 (HH) <18 ng/L    Comment: CRITICAL RESULT CALLED TO, READ BACK BY AND VERIFIED WITH: CRITICAL RESULT CALLED TO, READ BACK BY AND VERIFIED WITH: S BETHARD,RN 1820 09/06/2019 WBOND (NOTE) Elevated high sensitivity  troponin I (hsTnI) values and significant  changes across serial measurements may suggest ACS but many other  chronic and acute conditions are known to elevate hsTnI results.  Refer to the Links section  for chest pain algorithms and additional  guidance. Performed at [facility], [place] [address], [place], [place] [address]     Labs:   Lab Results  Component Value Date   WBC 9.1 09/06/2019   HGB 14.9 09/06/2019   HCT 45.4 09/06/2019   MCV 94.4 09/06/2019   PLT 213 09/06/2019    Recent Labs  Lab 09/06/19 1729  NA 137  K 4.2  CL 103  CO2 27  BUN 14  CREATININE 1.15  CALCIUM 8.9  GLUCOSE 105*    Lipid Panel  No results found for: CHOL, TRIG, HDL, CHOLHDL, VLDL, LDLCALC  BNP (last 3 results) No results for input(s): BNP in the last 8760 hours.  HEMOGLOBIN A1C No results found for: HGBA1C, MPG  Cardiac Panel (last 3 results) No results for input(s): CKTOTAL, CKMB, RELINDX in the last 8760 hours.  Invalid input(s): TROPONINHS  No results found for: CKTOTAL, CKMB, CKMBINDEX   TSH No results for input(s): TSH in the last 8760 hours.   (Not in a hospital admission)     Current Facility-Administered Medications:  .  acetaminophen (TYLENOL) tablet 650 mg, 650 mg, Oral, Q4H PRN, Marisal Swarey J, MD .  aspirin chewable tablet 324 mg, 324 mg, Oral, NOW **OR** aspirin suppository 300 mg, 300 mg, Rectal, NOW, Viviann Broyles J, MD .  Derrill Memo ON 09/07/2019] aspirin EC tablet 81 mg, 81 mg, Oral, Daily, Zerina Hallinan J, MD .  atorvastatin (LIPITOR) tablet 80 mg, 80 mg, Oral, Daily, Benigna Delisi J, MD .  heparin ADULT infusion 100 units/mL (25000 units/270mL sodium chloride 0.45%), 1,100 Units/hr, Intravenous, Continuous, Rumbarger, Valeda Malm, RPH .  heparin bolus via infusion 4,000 Units, 4,000 Units, Intravenous, Once, Rumbarger, Rachel L, RPH .  nitroGLYCERIN (NITROSTAT) SL tablet 0.4 mg, 0.4 mg, Sublingual, Q5 Min x 3 PRN, Tallis Soledad J,  MD .  ondansetron (ZOFRAN) injection 4 mg, 4 mg, Intravenous, Q6H PRN, Tashonna Descoteaux J, MD  Current Outpatient Medications:  .  aspirin 81 MG EC tablet, Take 81 mg by mouth daily with breakfast. , Disp: , Rfl:  .  HYDROcodone-acetaminophen (NORCO) 5-325 MG per tablet, Take 1-2 tablets by mouth every 6 (six) hours as needed., Disp: 20 tablet, Rfl: 0 .  Polyethylene Glycol 400 (BLINK TEARS) 0.25 % SOLN, Apply 1 drop to eye 2 (two) times daily as needed (dry eyes)., Disp: , Rfl:  .  predniSONE (DELTASONE) 10 MG tablet, Take 2 tablets (20 mg total) by mouth 2 (two) times daily., Disp: 12 tablet, Rfl: 0 .  sertraline (ZOLOFT) 50 MG tablet, Take 50 mg by mouth daily with breakfast. , Disp: , Rfl:  .  traMADol (ULTRAM) 50 MG tablet, Take 50 mg by mouth every 6 (six) hours as needed for moderate pain., Disp: , Rfl:    Today's Vitals   09/06/19 1728 09/06/19 1733 09/06/19 1904  BP:  (!) 150/83   Pulse:  70   Resp:  18   Temp:  99 F (37.2 C)   TempSrc:  Oral   SpO2:  97%   Weight:   88 kg  Height:   5\' 11"  (1.803 m)  PainSc: 1      Body mass index is 27.06 kg/m.   CARDIAC STUDIES:  EKG 09/06/2019: Sinus rhythm. Old anteroseptal infarct. Lateral nonspecific ST-T changes  Echocardiogram pending:   Assessment/Plan  79 y.o. Caucasian male  with hypertension, chest pain.  Chest pain: NSTEMI. HS Trop >7000 Currently chest pain free. Aspirin 81  mg, heparin, lipitor 80 mg Echocardiogram NPO after midnight Plan for cath in am  Hypertension: Not on any therapy at baseline. HR in 50s-60s. Added amlodipine 5 mg.    Nigel Mormon, MD Pager: [phone] Office: [phone]

## 2019-09-06 NOTE — ED Triage Notes (Signed)
Pt here from urgent care for further eval of intermittent chest pain since 0500 this morning. Occasional radiation to neck and jaw. Sts it feels like the pain he has from hiatal hernia. 324 ASA and 2 nitro given by EMS with some relief.

## 2019-09-06 NOTE — ED Provider Notes (Addendum)
[facility] EMERGENCY DEPARTMENT Provider Note   CSN: [id number] Arrival date & time: 09/06/19  1725     History Chief Complaint  Patient presents with  . Chest Pain    Carlos Davidson is a 79 y.o. male.  Presents ER with concern for chest pain.  Patient reports over the past couple weeks he has noted some intermittent occasional indigestion, reports it lasts 10 to 15 minutes, resolved spontaneously, no associated with exertion.  This morning he woke up with moderate discomfort in his lower central chest as well as some indigestion, also had slight pain sensation in his bilateral arms and bilateral jaw.  Reports that the symptoms are relatively constant throughout the day today, they completely resolved after receiving nitroglycerin by EMS.  He has no ongoing symptoms at this time.  Also received 324 aspirin from EMS.  Reports history of hypertension, currently diet-controlled.  Denies any other chronic medical problems, non-smoker.  Has never been diagnosed with CAD.  HPI     Past Medical History:  Diagnosis Date  . Arthritis   . Compression of sciatic nerve    right leg with numbness above knee at times  . Depression   . Dysrhythmia 2005   no cardiologist  . GERD (gastroesophageal reflux disease)   . H/O hiatal hernia   . Hemorrhoids   . Hyperlipidemia   . Hypertension    pt unaware 08/18/11  . Pain, lower leg    burning right leg--STATES TO ELEVATE RIGHT ANKLE  . Psoriasis   . Seasonal allergies    hay fever  . Sleep apnea    STOP BANG SCORE 4  . Umbilical hernia     Patient Active Problem List   Diagnosis Date Noted  . NSTEMI (non-ST elevated myocardial infarction) ([facility]) 09/06/2019  . Umbilical hernia 22/97/9892  . ASPERGILLOSIS 03/13/2010  . TYMPANIC MEMBRANE PERFORATION 03/13/2010    Past Surgical History:  Procedure Laterality Date  . HERNIA REPAIR  08/26/2011  . INNER EAR SURGERY  07/24/2009  . [place]   TURP  . UMBILICAL  HERNIA REPAIR  08/26/2011   Procedure: HERNIA REPAIR UMBILICAL ADULT;  Surgeon: Imogene Burn. Georgette Dover, MD;  Location: [facility];  Service: General;  Laterality: N/A;  Umbilical Hernia Repair with Mesh       Family History  Problem Relation Age of Onset  . Cancer Mother        breast  . Heart disease Mother   . Kidney disease Father     Social History   Tobacco Use  . Smoking status: Never Smoker  . Smokeless tobacco: Never Used  Substance Use Topics  . Alcohol use: No  . Drug use: No    Home Medications Prior to Admission medications   Medication Sig Start Date End Date Taking? Authorizing Provider  aspirin 81 MG EC tablet Take 81 mg by mouth daily with breakfast.     [provider]  HYDROcodone-acetaminophen (NORCO) 5-325 MG per tablet Take 1-2 tablets by mouth every 6 (six) hours as needed. 07/09/13   Veryl Speak, MD  Polyethylene Glycol 400 (BLINK TEARS) 0.25 % SOLN Apply 1 drop to eye 2 (two) times daily as needed (dry eyes).    [provider]  predniSONE (DELTASONE) 10 MG tablet Take 2 tablets (20 mg total) by mouth 2 (two) times daily. 07/09/13   Veryl Speak, MD  sertraline (ZOLOFT) 50 MG tablet Take 50 mg by mouth daily with breakfast.  [provider]  traMADol (ULTRAM) 50 MG tablet Take 50 mg by mouth every 6 (six) hours as needed for moderate pain.    [provider]    Allergies    Tetracycline, Streptomycin, and Sulfonamide derivatives  Review of Systems   Review of Systems  Constitutional: Negative for chills and fever.  HENT: Negative for ear pain and sore throat.   Eyes: Negative for pain and visual disturbance.  Respiratory: Positive for chest tightness. Negative for cough and shortness of breath.   Cardiovascular: Positive for chest pain. Negative for palpitations.  Gastrointestinal: Positive for nausea. Negative for abdominal pain and vomiting.  Genitourinary: Negative for dysuria and hematuria.  Musculoskeletal:  Negative for arthralgias and back pain.  Skin: Negative for color change and rash.  Neurological: Negative for seizures and syncope.  All other systems reviewed and are negative.   Physical Exam Updated Vital Signs BP (!) 150/83 (BP Location: Left Arm)   Pulse 70   Temp 99 F (37.2 C) (Oral)   Resp 18   Ht 5\' 11"  (1.803 m)   Wt 88 kg   SpO2 97%   BMI 27.06 kg/m   Physical Exam Vitals and nursing note reviewed.  Constitutional:      Appearance: He is well-developed.  HENT:     Head: Normocephalic and atraumatic.  Eyes:     Conjunctiva/sclera: Conjunctivae normal.  Cardiovascular:     Rate and Rhythm: Normal rate and regular rhythm.     Heart sounds: Normal heart sounds. No murmur heard.   Pulmonary:     Effort: Pulmonary effort is normal. No respiratory distress.     Breath sounds: Normal breath sounds.  Abdominal:     Palpations: Abdomen is soft.     Tenderness: There is no abdominal tenderness.  Musculoskeletal:     Cervical back: Neck supple.  Skin:    General: Skin is warm and dry.     Capillary Refill: Capillary refill takes less than 2 seconds.  Neurological:     General: No focal deficit present.     Mental Status: He is alert and oriented to person, place, and time.     ED Results / Procedures / Treatments   Labs (all labs ordered are listed, but only abnormal results are displayed) Labs Reviewed  BASIC METABOLIC PANEL - Abnormal; Notable for the following components:      Result Value   Glucose, Bld 105 (*)    All other components within normal limits  TROPONIN I (HIGH SENSITIVITY) - Abnormal; Notable for the following components:   Troponin I (High Sensitivity) 7,879 (*)    All other components within normal limits  SARS CORONAVIRUS 2 BY RT PCR (HOSPITAL ORDER, [facility])  CBC  APTT  PROTIME-INR  MAGNESIUM  HEPARIN LEVEL (UNFRACTIONATED)  CBC  TROPONIN I (HIGH SENSITIVITY)    EKG EKG  Interpretation  Date/Time:  Wednesday September 06 2019 17:29:25 EDT Ventricular Rate:  65 PR Interval:  146 QRS Duration: 88 QT Interval:  388 QTC Calculation: 403 R Axis:   83 Text Interpretation: Normal sinus rhythm with sinus arrhythmia Possible Left atrial enlargement Septal infarct , age undetermined Lateral infarct , age undetermined Abnormal ECG no acute STEMI Confirmed by Madalyn Rob [phone]) on 09/06/2019 6:39:52 PM   Radiology DG Chest 2 View  Result Date: 09/06/2019 CLINICAL DATA:  Chest pain. Additional provided: Chest pain in mid sternum, radiating to both arms and jaw, weakness. EXAM: CHEST - 2  VIEW COMPARISON:  Chest radiographs 08/18/2011. FINDINGS: Heart size within normal limits. No appreciable airspace consolidation or pulmonary edema. No evidence of pleural effusion or pneumothorax. No acute bony abnormality identified. IMPRESSION: No evidence of active cardiopulmonary disease. Electronically Signed   By: Kellie Simmering DO   On: 09/06/2019 18:34    Procedures .Critical Care Performed by: Lucrezia Starch, MD Authorized by: Lucrezia Starch, MD   Critical care provider statement:    Critical care time (minutes):  45   Critical care was necessary to treat or prevent imminent or life-threatening deterioration of the following conditions:  Cardiac failure   Critical care was time spent personally by me on the following activities:  Discussions with consultants, evaluation of patient's response to treatment, examination of patient, ordering and performing treatments and interventions, ordering and review of laboratory studies, ordering and review of radiographic studies, pulse oximetry, re-evaluation of patient's condition, obtaining history from patient or surrogate and review of old charts   (including critical care time)  Medications Ordered in ED Medications  heparin bolus via infusion 4,000 Units (has no administration in time range)  heparin ADULT infusion 100  units/mL (25000 units/263mL sodium chloride 0.45%) (has no administration in time range)    ED Course  I have reviewed the triage vital signs and the nursing notes.  Pertinent labs & imaging results that were available during my care of the patient were reviewed by me and considered in my medical decision making (see chart for details).    MDM Rules/Calculators/A&P                          79 year old male presenting to ER with concern for chest pain.  Prior to arrival patient received aspirin and nitroglycerin.  Currently patient is symptom-free.  EKG negative for STEMI.  However troponin was profoundly elevated.  Given symptomatology, elevated troponin, suspect acute coronary syndrome, presentation currently consistent with NSTEMI.  Consulted cardiology, reviewed case with Dr. Virgina Jock who will admit patient.  Consult to pharmacy for heparin.  8:08 PM ADDENDUM - I initially placed admit order for [place] to admit, but Patwardhan will be the admitting attending. He has placed admission orders.  Final Clinical Impression(s) / ED Diagnoses Final diagnoses:  NSTEMI (non-ST elevated myocardial infarction) [facility])  ACS (acute coronary syndrome) [facility])    Rx / DC Orders ED Discharge Orders    None       Lucrezia Starch, MD 09/06/19 1940    Lucrezia Starch, MD 09/06/19 2008

## 2019-09-06 NOTE — Progress Notes (Signed)
ANTICOAGULATION CONSULT NOTE - Initial Consult  Pharmacy Consult for heparin Indication: chest pain/ACS  Allergies  Allergen Reactions  . Tetracycline Other (See Comments)    Tightness of chest. Per patient was given in combination with another medication. Not sure of name.  . Streptomycin Other (See Comments)    Tightness in chest 1970  . Sulfonamide Derivatives Rash    Upper arms and stomach    Patient Measurements: Height: 5\' 11"  (180.3 cm) Weight: 88 kg (194 lb 0.1 oz) IBW/kg (Calculated) : 75.3 Heparin Dosing Weight: 88kg  Vital Signs: Temp: 99 F (37.2 C) (08/18 1733) Temp Source: Oral (08/18 1733) BP: 150/83 (08/18 1733) Pulse Rate: 70 (08/18 1733)  Labs: Recent Labs    09/06/19 1729  HGB 14.9  HCT 45.4  PLT 213  CREATININE 1.15  TROPONINIHS 7,879*    Estimated Creatinine Clearance: 57.3 mL/min (by C-G formula based on SCr of 1.15 mg/dL).   Medical History: Past Medical History:  Diagnosis Date  . Arthritis   . Compression of sciatic nerve    right leg with numbness above knee at times  . Depression   . Dysrhythmia 2005   no cardiologist  . GERD (gastroesophageal reflux disease)   . H/O hiatal hernia   . Hemorrhoids   . Hyperlipidemia   . Hypertension    pt unaware 08/18/11  . Pain, lower leg    burning right leg--STATES TO ELEVATE RIGHT ANKLE  . Psoriasis   . Seasonal allergies    hay fever  . Sleep apnea    STOP BANG SCORE 4  . Umbilical hernia     Medications:  Infusions:  . heparin      Assessment: 89 yom presented to the ED with CP. Troponin elevated and now starting IV heparin. Baseline CBC is WNL. He is not on anticoagulation PTA.   Goal of Therapy:  Heparin level 0.3-0.7 units/ml Monitor platelets by anticoagulation protocol: Yes   Plan:  Heparin bolus 4000 units IV x 1 Heparin gtt 1100 units/hr Check an 8 hr heparin level Daily heparin level and CBC  Shaleigh Laubscher, Rande Lawman 09/06/2019,7:32 PM

## 2019-09-07 ENCOUNTER — Inpatient Hospital Stay (HOSPITAL_COMMUNITY): Payer: Medicare HMO

## 2019-09-07 ENCOUNTER — Encounter (HOSPITAL_COMMUNITY)
Admission: EM | Disposition: A | Discharge status: Home / Self Care | Payer: Self-pay | Source: Home / Self Care | Attending: Surgery

## 2019-09-07 ENCOUNTER — Encounter (HOSPITAL_COMMUNITY): Payer: Self-pay | Admitting: Cardiology

## 2019-09-07 ENCOUNTER — Other Ambulatory Visit: Payer: Self-pay | Admitting: *Deleted

## 2019-09-07 DIAGNOSIS — R079 Chest pain, unspecified: Secondary | ICD-10-CM

## 2019-09-07 DIAGNOSIS — I25110 Atherosclerotic heart disease of native coronary artery with unstable angina pectoris: Secondary | ICD-10-CM

## 2019-09-07 DIAGNOSIS — I2510 Atherosclerotic heart disease of native coronary artery without angina pectoris: Secondary | ICD-10-CM

## 2019-09-07 HISTORY — PX: LEFT HEART CATH AND CORONARY ANGIOGRAPHY: CATH118249

## 2019-09-07 LAB — LIPID PANEL
Cholesterol: 171 mg/dL (ref 0–200)
HDL: 50 mg/dL (ref >40)
LDL Cholesterol: 111 mg/dL — ABNORMAL HIGH (ref 0–99)
Total CHOL/HDL Ratio: 3.4 RATIO
Triglycerides: 49 mg/dL (ref <150)
VLDL: 10 mg/dL (ref 0–40)

## 2019-09-07 LAB — BASIC METABOLIC PANEL
Anion gap: 10 (ref 5–15)
BUN: 16 mg/dL (ref 8–23)
CO2: 24 mmol/L (ref 22–32)
Calcium: 8.8 mg/dL — ABNORMAL LOW (ref 8.9–10.3)
Chloride: 105 mmol/L (ref 98–111)
Creatinine, Ser: 1.03 mg/dL (ref 0.61–1.24)
GFR calc Af Amer: >60 mL/min (ref >60)
GFR calc non Af Amer: >60 mL/min (ref >60)
Glucose, Bld: 100 mg/dL — ABNORMAL HIGH (ref 70–99)
Potassium: 4.1 mmol/L (ref 3.5–5.1)
Sodium: 139 mmol/L (ref 135–145)

## 2019-09-07 LAB — CBC
HCT: 47.2 % (ref 39.0–52.0)
Hemoglobin: 15.4 g/dL (ref 13.0–17.0)
MCH: 31.3 pg (ref 26.0–34.0)
MCHC: 32.6 g/dL (ref 30.0–36.0)
MCV: 95.9 fL (ref 80.0–100.0)
Platelets: 201 10*3/uL (ref 150–400)
RBC: 4.92 MIL/uL (ref 4.22–5.81)
RDW: 12.4 % (ref 11.5–15.5)
WBC: 9.7 10*3/uL (ref 4.0–10.5)
nRBC: 0 % (ref 0.0–0.2)

## 2019-09-07 LAB — ECHOCARDIOGRAM COMPLETE
Area-P 1/2: 3.03 cm2
Height: 71 in
S' Lateral: 2.8 cm
Weight: 3104.08 oz

## 2019-09-07 LAB — HEPARIN LEVEL (UNFRACTIONATED): Heparin Unfractionated: 0.65 IU/mL (ref 0.30–0.70)

## 2019-09-07 LAB — HEMOGLOBIN A1C
Hgb A1c MFr Bld: 5.7 % — ABNORMAL HIGH (ref 4.8–5.6)
Mean Plasma Glucose: 116.89 mg/dL

## 2019-09-07 SURGERY — LEFT HEART CATH AND CORONARY ANGIOGRAPHY
Anesthesia: LOCAL

## 2019-09-07 MED ORDER — SODIUM CHLORIDE 0.9 % IV SOLN: 250.0000 mL | INTRAVENOUS | Status: DC | PRN

## 2019-09-07 MED ORDER — VERAPAMIL HCL 2.5 MG/ML IV SOLN
INTRAVENOUS | Status: AC
  Filled 2019-09-07: qty 2

## 2019-09-07 MED ORDER — ASPIRIN 81 MG PO CHEW
81.0000 mg | CHEWABLE_TABLET | ORAL | Status: AC
  Administered 2019-09-07: 81 mg via ORAL
  Filled 2019-09-07: qty 1

## 2019-09-07 MED ORDER — HEPARIN (PORCINE) IN NACL 1000-0.9 UT/500ML-% IV SOLN
INTRAVENOUS | Status: AC
  Filled 2019-09-07: qty 1000

## 2019-09-07 MED ORDER — SODIUM CHLORIDE 0.9% FLUSH: 3.0000 mL | INTRAVENOUS | Status: DC | PRN

## 2019-09-07 MED ORDER — HEPARIN (PORCINE) IN NACL 1000-0.9 UT/500ML-% IV SOLN
INTRAVENOUS | Status: DC | PRN
  Administered 2019-09-07: 500 mL

## 2019-09-07 MED ORDER — SODIUM CHLORIDE 0.9% FLUSH
3.0000 mL | Freq: Two times a day (BID) | INTRAVENOUS | Status: DC
  Administered 2019-09-08: 3 mL via INTRAVENOUS

## 2019-09-07 MED ORDER — LIDOCAINE HCL (PF) 1 % IJ SOLN
INTRAMUSCULAR | Status: AC
  Filled 2019-09-07: qty 30

## 2019-09-07 MED ORDER — SODIUM CHLORIDE 0.9 % IV SOLN: INTRAVENOUS | Status: AC

## 2019-09-07 MED ORDER — ONDANSETRON HCL 4 MG/2ML IJ SOLN: 4.0000 mg | Freq: Four times a day (QID) | INTRAMUSCULAR | Status: DC | PRN

## 2019-09-07 MED ORDER — NITROGLYCERIN IN D5W 200-5 MCG/ML-% IV SOLN
0.0000 ug/min | INTRAVENOUS | Status: DC
  Administered 2019-09-07 – 2019-09-10 (×2): 10 ug/min via INTRAVENOUS
  Filled 2019-09-07 (×2): qty 250

## 2019-09-07 MED ORDER — ACETAMINOPHEN 325 MG PO TABS: 650.0000 mg | ORAL_TABLET | ORAL | Status: DC | PRN

## 2019-09-07 MED ORDER — SODIUM CHLORIDE 0.9 % WEIGHT BASED INFUSION: 1.0000 mL/kg/h | INTRAVENOUS | Status: DC

## 2019-09-07 MED ORDER — HYDRALAZINE HCL 20 MG/ML IJ SOLN: 10.0000 mg | INTRAMUSCULAR | Status: AC | PRN

## 2019-09-07 MED ORDER — MIDAZOLAM HCL 2 MG/2ML IJ SOLN
INTRAMUSCULAR | Status: DC | PRN
  Administered 2019-09-07: 1 mg via INTRAVENOUS

## 2019-09-07 MED ORDER — VERAPAMIL HCL 2.5 MG/ML IV SOLN
INTRAVENOUS | Status: DC | PRN
  Administered 2019-09-07: 10 mL via INTRA_ARTERIAL

## 2019-09-07 MED ORDER — FENTANYL CITRATE (PF) 100 MCG/2ML IJ SOLN
INTRAMUSCULAR | Status: DC | PRN
Start: 2019-09-07 — End: 2019-09-07
  Administered 2019-09-07: 50 ug via INTRAVENOUS

## 2019-09-07 MED ORDER — HEPARIN (PORCINE) 25000 UT/250ML-% IV SOLN
1100.0000 [IU]/h | INTRAVENOUS | Status: DC
  Administered 2019-09-07 – 2019-09-10 (×4): 1100 [IU]/h via INTRAVENOUS
  Filled 2019-09-07 (×4): qty 250

## 2019-09-07 MED ORDER — LIDOCAINE HCL (PF) 1 % IJ SOLN
INTRAMUSCULAR | Status: DC | PRN
  Administered 2019-09-07: 2 mL

## 2019-09-07 MED ORDER — FENTANYL CITRATE (PF) 100 MCG/2ML IJ SOLN
INTRAMUSCULAR | Status: AC
  Filled 2019-09-07: qty 2

## 2019-09-07 MED ORDER — SODIUM CHLORIDE 0.9% FLUSH: 3.0000 mL | Freq: Two times a day (BID) | INTRAVENOUS | Status: DC

## 2019-09-07 MED ORDER — HEPARIN SODIUM (PORCINE) 1000 UNIT/ML IJ SOLN
INTRAMUSCULAR | Status: DC | PRN
  Administered 2019-09-07: 4500 [IU] via INTRAVENOUS

## 2019-09-07 MED ORDER — SODIUM CHLORIDE 0.9 % WEIGHT BASED INFUSION
3.0000 mL/kg/h | INTRAVENOUS | Status: AC
  Administered 2019-09-07: 3 mL/kg/h via INTRAVENOUS

## 2019-09-07 MED ORDER — MIDAZOLAM HCL 2 MG/2ML IJ SOLN
INTRAMUSCULAR | Status: AC
  Filled 2019-09-07: qty 2

## 2019-09-07 MED ORDER — HEPARIN SODIUM (PORCINE) 1000 UNIT/ML IJ SOLN
INTRAMUSCULAR | Status: AC
  Filled 2019-09-07: qty 1

## 2019-09-07 MED ORDER — LABETALOL HCL 5 MG/ML IV SOLN: 10.0000 mg | INTRAVENOUS | Status: AC | PRN

## 2019-09-07 MED ORDER — IOHEXOL 350 MG/ML SOLN
INTRAVENOUS | Status: DC | PRN
  Administered 2019-09-07: 30 mL

## 2019-09-07 SURGICAL SUPPLY — 11 items
CATH 5FR JL3.5 JR4 ANG PIG MP (CATHETERS) ×2 IMPLANT
CATH INFINITI 5 FR 3DRC (CATHETERS) ×2 IMPLANT
DEVICE RAD COMP TR BAND LRG (VASCULAR PRODUCTS) ×2 IMPLANT
GLIDESHEATH SLEND A-KIT 6F 22G (SHEATH) ×2 IMPLANT
GUIDEWIRE INQWIRE 1.5J.035X260 (WIRE) ×1 IMPLANT
INQWIRE 1.5J .035X260CM (WIRE) ×2
KIT HEART LEFT (KITS) ×2 IMPLANT
PACK CARDIAC CATHETERIZATION (CUSTOM PROCEDURE TRAY) ×2 IMPLANT
SHEATH PROBE COVER 6X72 (BAG) ×2 IMPLANT
TRANSDUCER W/STOPCOCK (MISCELLANEOUS) ×2 IMPLANT
TUBING CIL FLEX 10 FLL-RA (TUBING) ×2 IMPLANT

## 2019-09-07 NOTE — Progress Notes (Signed)
Pt having CP 2/10, SL ntg given with relief. Notified Dr. Virgina Jock, IV Ntg drip ordered. Cont to monitor. Carroll Kinds RN

## 2019-09-07 NOTE — Progress Notes (Signed)
[place] for heparin Indication: chest pain/ACS  Allergies  Allergen Reactions  . Tetracycline Other (See Comments)    Tightness of chest. Per patient was given in combination with another medication. Not sure of name.  . Streptomycin Other (See Comments)    Tightness in chest 1970  . Sulfonamide Derivatives Rash    Upper arms and stomach    Patient Measurements: Height: 5\' 11"  (180.3 cm) Weight: 88 kg (194 lb 0.1 oz) IBW/kg (Calculated) : 75.3 Heparin Dosing Weight: 88kg  Vital Signs: Temp: 99 F (37.2 C) (08/18 1733) Temp Source: Oral (08/18 1733) BP: 159/76 (08/19 0300) Pulse Rate: 78 (08/19 0300)  Labs: Recent Labs    09/06/19 1729 09/06/19 2040 09/07/19 0428  HGB 14.9  --  15.4  HCT 45.4  --  47.2  PLT 213  --  201  HEPARINUNFRC  --   --  0.65  CREATININE 1.15  --   --   TROPONINIHS 7,879* 9,991*  --     Estimated Creatinine Clearance: 57.3 mL/min (by C-G formula based on SCr of 1.15 mg/dL).  Assessment: 79 y.o. male with chest pain for heparin    Goal of Therapy:  Heparin level 0.3-0.7 units/ml Monitor platelets by anticoagulation protocol: Yes   Plan:  Continue Heparin at current rate  Follow up after cath today   Caryl Pina 09/07/2019,4:49 AM

## 2019-09-07 NOTE — Progress Notes (Signed)
  Echocardiogram 2D Echocardiogram has been performed.  Carlos Davidson 09/07/2019, 9:40 AM

## 2019-09-07 NOTE — Progress Notes (Signed)
[place] for heparin Indication: chest pain/ACS  Allergies  Allergen Reactions  . Tetracycline Other (See Comments)    Tightness of chest. Per patient was given in combination with another medication. Not sure of name.  . Streptomycin Other (See Comments)    Tightness in chest 1970  . Sulfonamide Derivatives Rash    Upper arms and stomach    Patient Measurements: Height: 5\' 11"  (180.3 cm) Weight: 88 kg (194 lb 0.1 oz) IBW/kg (Calculated) : 75.3 Heparin Dosing Weight: 88kg  Vital Signs: Temp: 98.9 F (37.2 C) (08/19 1336) Temp Source: Oral (08/19 1336) BP: 127/96 (08/19 1336) Pulse Rate: 64 (08/19 1336)  Labs: Recent Labs    09/06/19 1729 09/06/19 2040 09/07/19 0428  HGB 14.9  --  15.4  HCT 45.4  --  47.2  PLT 213  --  201  HEPARINUNFRC  --   --  0.65  CREATININE 1.15  --  1.03  TROPONINIHS 7,879* 9,991*  --     Estimated Creatinine Clearance: 64 mL/min (by C-G formula based on SCr of 1.03 mg/dL).   Medical History: Past Medical History:  Diagnosis Date  . Arthritis   . Compression of sciatic nerve    right leg with numbness above knee at times  . Depression   . Dysrhythmia 2005   no cardiologist  . GERD (gastroesophageal reflux disease)   . H/O hiatal hernia   . Hemorrhoids   . Hyperlipidemia   . Hypertension    pt unaware 08/18/11  . Pain, lower leg    burning right leg--STATES TO ELEVATE RIGHT ANKLE  . Psoriasis   . Seasonal allergies    hay fever  . Sleep apnea    STOP BANG SCORE 4  . Umbilical hernia     Medications:  Infusions:  . sodium chloride    . sodium chloride 100 mL/hr at 09/07/19 1248  . sodium chloride    . heparin 1,100 Units/hr (09/06/19 2044)    Assessment: 40 yom presented to the ED with CP and started on IV heparin, no anticoagulation PTA. Pt now s/p LHC with inability to intervene, pharmacy to continue IV heparin while CVTS evaluates for CABG.  Goal of Therapy:  Heparin level  0.3-0.7 units/ml Monitor platelets by anticoagulation protocol: Yes   Plan:  Restart heparin 1100 units/h at 2000 (8hr after sheath removal) Check heparin level in [place], PharmD, BCPS Clinical Pharmacist [phone] Please check AMION for all [place] numbers 09/07/2019

## 2019-09-07 NOTE — Interval H&P Note (Signed)
History and Physical Interval Note:  09/07/2019 11:01 AM  Carlos Davidson  has presented today for surgery, with the diagnosis of unstable angina.  The various methods of treatment have been discussed with the patient and family. After consideration of risks, benefits and other options for treatment, the patient has consented to  Procedure(s): LEFT HEART CATH AND CORONARY ANGIOGRAPHY (N/A) as a surgical intervention.  The patient's history has been reviewed, patient examined, no change in status, stable for surgery.  I have reviewed the patient's chart and labs.  Questions were answered to the patient's satisfaction.    NSTE-ACS   Zacarias Krauter J Mathew Postiglione

## 2019-09-07 NOTE — ED Notes (Signed)
Pt had run of vtach at 0812. MD paged

## 2019-09-07 NOTE — Consult Note (Signed)
[address]       [place] [address]             [phone]      Cardiothoracic Surgery Consultation  Reason for Consult: Severe multivessel coronary disease Referring Physician: Dr. Elmarie Mainland is an 79 y.o. male.  HPI:   The patient is a 79 year old gentleman with hypertension and hyperlipidemia who has a several month history of exertional fatigue and had onset of lower substernal chest tightness early yesterday morning that awoke him from sleep.  It was associated with some pain in his throat as well is pain in both elbows.  It persisted all day but resolved with sublingual nitroglycerin.  He initially thought it was due to hiatal hernia and GERD says it seemed to improve with belching and an acids.  Since the pain kept recurring he called EMS.  His initial high-sensitivity troponin was 7879 and a second value was 9991.  He was pain-free on arrival.  Electrocardiogram showed no acute ST changes.  He underwent Cardiac catheterization this morning which showed about 40% distal left main with calcification.  There is 90% ostial LAD stenosis with severe calcification.  There is no obvious lesion left circumflex but TIMI II flow.  There was a moderate size first marginal with a long 90% stenosis.  Right coronary artery was a large vessel with no significant stenosis.  Left ventricular ejection fraction was normal.  LVEDP was normal.  The patient's wife and son are with him today.  He is a retired [employer] manager.  He has remained active pushing his lawnmower.  He said that he had a similar pain about 4 times in the last 2 months.  He attributed his decreased energy level to getting older.  Past Medical History:  Diagnosis Date  . Arthritis   . Compression of sciatic nerve    right leg with numbness above knee at times  . Depression   . Dysrhythmia 2005   no cardiologist  . GERD (gastroesophageal reflux disease)   . H/O hiatal hernia   . Hemorrhoids   .  Hyperlipidemia   . Hypertension    pt unaware 08/18/11  . Pain, lower leg    burning right leg--STATES TO ELEVATE RIGHT ANKLE  . Psoriasis   . Seasonal allergies    hay fever  . Sleep apnea    STOP BANG SCORE 4  . Umbilical hernia     Past Surgical History:  Procedure Laterality Date  . HERNIA REPAIR  08/26/2011  . INNER EAR SURGERY  07/24/2009  . LEFT HEART CATH AND CORONARY ANGIOGRAPHY N/A 09/07/2019   Procedure: LEFT HEART CATH AND CORONARY ANGIOGRAPHY;  Surgeon: Nigel Mormon, MD;  Location: [facility];  Service: Cardiovascular;  Laterality: N/A;  . [place]   TURP  . UMBILICAL HERNIA REPAIR  08/26/2011   Procedure: HERNIA REPAIR UMBILICAL ADULT;  Surgeon: Imogene Burn. Georgette Dover, MD;  Location: [facility];  Service: General;  Laterality: N/A;  Umbilical Hernia Repair with Mesh    Family History  Problem Relation Age of Onset  . Cancer Mother        breast  . Heart disease Mother   . Kidney disease Father     Social History:  reports that he has never smoked. He has never used smokeless tobacco. He reports that he does not drink alcohol and does not use drugs.  Allergies:  Allergies  Allergen Reactions  .  Tetracycline Other (See Comments)    Tightness of chest. Per patient was given in combination with another medication. Not sure of name.  . Streptomycin Other (See Comments)    Tightness in chest 1970  . Sulfonamide Derivatives Rash    Upper arms and stomach    Medications:  I have reviewed the patient's current medications. Prior to Admission:  Medications Prior to Admission  Medication Sig Dispense Refill Last Dose  . Polyethyl Glycol-Propyl Glycol (SYSTANE) 0.4-0.3 % GEL ophthalmic gel Place 1 application into both eyes daily as needed (dry eyes).   unk  . sertraline (ZOLOFT) 50 MG tablet Take 50 mg by mouth daily with breakfast.    09/06/2019 at Unknown time  . HYDROcodone-acetaminophen (NORCO) 5-325 MG per tablet Take 1-2 tablets by mouth every  6 (six) hours as needed. (Patient not taking: Reported on 09/06/2019) 20 tablet 0 Not Taking at Unknown time  . Polyethylene Glycol 400 (BLINK TEARS) 0.25 % SOLN Apply 1 drop to eye 2 (two) times daily as needed (dry eyes). (Patient not taking: Reported on 09/06/2019)   Not Taking at Unknown time  . predniSONE (DELTASONE) 10 MG tablet Take 2 tablets (20 mg total) by mouth 2 (two) times daily. (Patient not taking: Reported on 09/06/2019) 12 tablet 0 Completed Course at Unknown time   Scheduled: . amLODipine  5 mg Oral Daily  . aspirin EC  81 mg Oral Daily  . atorvastatin  80 mg Oral Daily  . sodium chloride flush  3 mL Intravenous Q12H   Continuous: . sodium chloride 100 mL/hr at 09/07/19 1248  . sodium chloride    . heparin     BJY:NWGNFA chloride, acetaminophen, hydrALAZINE, labetalol, nitroGLYCERIN, ondansetron (ZOFRAN) IV, polyvinyl alcohol, sodium chloride flush Anti-infectives (From admission, onward)   None      Results for orders placed or performed during the hospital encounter of 09/06/19 (from the past 48 hour(s))  Basic metabolic panel     Status: Abnormal   Collection Time: 09/06/19  5:29 PM  Result Value Ref Range   Sodium 137 135 - 145 mmol/L   Potassium 4.2 3.5 - 5.1 mmol/L   Chloride 103 98 - 111 mmol/L   CO2 27 22 - 32 mmol/L   Glucose, Bld 105 (H) 70 - 99 mg/dL    Comment: Glucose reference range applies only to samples taken after fasting for at least 8 hours.   BUN 14 8 - 23 mg/dL   Creatinine, Ser 1.15 0.61 - 1.24 mg/dL   Calcium 8.9 8.9 - 10.3 mg/dL   GFR calc non Af Amer >60 >60 mL/min   GFR calc Af Amer >60 >60 mL/min   Anion gap 7 5 - 15    Comment: Performed at [facility] [address], [place] [address]  CBC     Status: None   Collection Time: 09/06/19  5:29 PM  Result Value Ref Range   WBC 9.1 4.0 - 10.5 K/uL   RBC 4.81 4.22 - 5.81 MIL/uL   Hemoglobin 14.9 13.0 - 17.0 g/dL   HCT 45.4 39 - 52 %   MCV 94.4 80.0 - 100.0 fL   MCH  31.0 26.0 - 34.0 pg   MCHC 32.8 30.0 - 36.0 g/dL   RDW 12.4 11.5 - 15.5 %   Platelets 213 150 - 400 K/uL   nRBC 0.0 0.0 - 0.2 %    Comment: Performed at [facility], [place] [address] [place], [place]  [address]  Troponin I (High Sensitivity)     Status: Abnormal   Collection Time: 09/06/19  5:29 PM  Result Value Ref Range   Troponin I (High Sensitivity) 7,879 (HH) <18 ng/L    Comment: CRITICAL RESULT CALLED TO, READ BACK BY AND VERIFIED WITH: CRITICAL RESULT CALLED TO, READ BACK BY AND VERIFIED WITH: S BETHARD,RN 1820 09/06/2019 WBOND (NOTE) Elevated high sensitivity troponin I (hsTnI) values and significant  changes across serial measurements may suggest ACS but many other  chronic and acute conditions are known to elevate hsTnI results.  Refer to the Links section for chest pain algorithms and additional  guidance. Performed at [facility], [place] [address], [place], [place] [address]   Troponin I (High Sensitivity)     Status: Abnormal   Collection Time: 09/06/19  8:40 PM  Result Value Ref Range   Troponin I (High Sensitivity) 9,991 (HH) <18 ng/L    Comment: CRITICAL VALUE NOTED.  VALUE IS CONSISTENT WITH PREVIOUSLY REPORTED AND CALLED VALUE. (NOTE) Elevated high sensitivity troponin I (hsTnI) values and significant  changes across serial measurements may suggest ACS but many other  chronic and acute conditions are known to elevate hsTnI results.  Refer to the Links section for chest pain algorithms and additional  guidance. Performed at [facility], [place] [address], [place], [place] [address]   SARS Coronavirus 2 by RT PCR (hospital order, performed in [facility] hospital lab) Nasopharyngeal Nasopharyngeal Swab     Status: None   Collection Time: 09/06/19  8:40 PM   Specimen: Nasopharyngeal Swab  Result Value Ref Range   SARS Coronavirus 2 NEGATIVE NEGATIVE    Comment: (NOTE) SARS-CoV-2 target nucleic acids are NOT DETECTED.  The SARS-CoV-2 RNA is  generally detectable in upper and lower respiratory specimens during the acute phase of infection. The lowest concentration of SARS-CoV-2 viral copies this assay can detect is 250 copies / mL. A negative result does not preclude SARS-CoV-2 infection and should not be used as the sole basis for treatment or other patient management decisions.  A negative result may occur with improper specimen collection / handling, submission of specimen other than nasopharyngeal swab, presence of viral mutation(s) within the areas targeted by this assay, and inadequate number of viral copies (<250 copies / mL). A negative result must be combined with clinical observations, patient history, and epidemiological information.  Fact Sheet for Patients:   StrictlyIdeas.no  Fact Sheet for Healthcare Providers: BankingDealers.co.za  This test is not yet approved or  cleared by the [place] FDA and has been authorized for detection and/or diagnosis of SARS-CoV-2 by FDA under an Emergency Use Authorization (EUA).  This EUA will remain in effect (meaning this test can be used) for the duration of the COVID-19 declaration under Section 564(b)(1) of the Act, 21 U.S.C. section 360bbb-3(b)(1), unless the authorization is terminated or revoked sooner.  Performed at [facility], [place] [address] [place], [place] [address]   Magnesium     Status: None   Collection Time: 09/06/19  8:40 PM  Result Value Ref Range   Magnesium 1.9 1.7 - 2.4 mg/dL    Comment: Performed at [facility] [address], [place], [place] [address]  Heparin level (unfractionated)     Status: None   Collection Time: 09/07/19  4:28 AM  Result Value Ref Range   Heparin Unfractionated 0.65 0.30 - 0.70 IU/mL    Comment: (NOTE) If heparin results are below expected  values, and patient dosage has  been confirmed, suggest follow up testing of antithrombin III levels. Performed at  [facility], [place] [address], [place] [address]   CBC     Status: None   Collection Time: 09/07/19  4:28 AM  Result Value Ref Range   WBC 9.7 4.0 - 10.5 K/uL   RBC 4.92 4.22 - 5.81 MIL/uL   Hemoglobin 15.4 13.0 - 17.0 g/dL   HCT 47.2 39 - 52 %   MCV 95.9 80.0 - 100.0 fL   MCH 31.3 26.0 - 34.0 pg   MCHC 32.6 30.0 - 36.0 g/dL   RDW 12.4 11.5 - 15.5 %   Platelets 201 150 - 400 K/uL   nRBC 0.0 0.0 - 0.2 %    Comment: Performed at [facility], [place] [address], [place], [place] [address]  Basic metabolic panel     Status: Abnormal   Collection Time: 09/07/19  4:28 AM  Result Value Ref Range   Sodium 139 135 - 145 mmol/L   Potassium 4.1 3.5 - 5.1 mmol/L   Chloride 105 98 - 111 mmol/L   CO2 24 22 - 32 mmol/L   Glucose, Bld 100 (H) 70 - 99 mg/dL    Comment: Glucose reference range applies only to samples taken after fasting for at least 8 hours.   BUN 16 8 - 23 mg/dL   Creatinine, Ser 1.03 0.61 - 1.24 mg/dL   Calcium 8.8 (L) 8.9 - 10.3 mg/dL   GFR calc non Af Amer >60 >60 mL/min   GFR calc Af Amer >60 >60 mL/min   Anion gap 10 5 - 15    Comment: Performed at [facility] [address], [place], [place] [address]  Hemoglobin A1c     Status: Abnormal   Collection Time: 09/07/19  4:28 AM  Result Value Ref Range   Hgb A1c MFr Bld 5.7 (H) 4.8 - 5.6 %    Comment: (NOTE) Pre diabetes:          5.7%-6.4%  Diabetes:              >6.4%  Glycemic control for   <7.0% adults with diabetes    Mean Plasma Glucose 116.89 mg/dL    Comment: Performed at [facility] [address], [place], [place] [address]  Lipid panel     Status: Abnormal   Collection Time: 09/07/19  4:28 AM  Result Value Ref Range   Cholesterol 171 0 - 200 mg/dL   Triglycerides 49 <150 mg/dL   HDL 50 >40 mg/dL   Total CHOL/HDL Ratio 3.4 RATIO   VLDL 10 0 - 40 mg/dL   LDL Cholesterol 111 (H) 0 - 99 mg/dL    Comment:        Total Cholesterol/HDL:CHD Risk Coronary Heart Disease Risk  Table                     Men   Women  1/2 Average Risk   3.4   3.3  Average Risk       5.0   4.4  2 X Average Risk   9.6   7.1  3 X Average Risk  23.4   11.0        Use the calculated Patient Ratio above and the CHD Risk Table to determine the patient's CHD Risk.        ATP III CLASSIFICATION (LDL):  <100  mg/dL   Optimal  100-129  mg/dL   Near or Above                    Optimal  130-159  mg/dL   Borderline  160-189  mg/dL   High  >190     mg/dL   Very High Performed at [facility] [address] [place], [place] [address]     DG Chest 2 View  Result Date: 09/06/2019 CLINICAL DATA:  Chest pain. Additional provided: Chest pain in mid sternum, radiating to both arms and jaw, weakness. EXAM: CHEST - 2 VIEW COMPARISON:  Chest radiographs 08/18/2011. FINDINGS: Heart size within normal limits. No appreciable airspace consolidation or pulmonary edema. No evidence of pleural effusion or pneumothorax. No acute bony abnormality identified. IMPRESSION: No evidence of active cardiopulmonary disease. Electronically Signed   By: Kellie Simmering DO   On: 09/06/2019 18:34   CARDIAC CATHETERIZATION  Result Date: 09/07/2019 LM: Distal 40% stenosis w/severe calcification LAD: Ostial 90% stenosis with severe calcification LCx: No obvious lesion in LCx, but with TIMI II flow        OM1 long 90% stenosis RCA: Prox-mid 20% stenosis Normal LVEDP Normal LVEF Sub-optimal landing zone for LAD stent given severe calcification extending into distal LM. Recommend CVTS consult for CABG to LAD and OM1 Continue Aspirin/heparin. Patient has not received P2Y12i. Nigel Mormon, MD Pager: [phone] Office: [phone]   ECHOCARDIOGRAM COMPLETE  Result Date: 09/07/2019    ECHOCARDIOGRAM REPORT   Patient Name:   JACOBUS COLVIN Date of Exam: 09/07/2019 Medical Rec #:  [id number]      Height:       71.0 in Accession #:    [id number]     Weight:       194.0 lb Date of Birth:  1940/08/15       BSA:           2.081 m Patient Age:    55 years       BP:           131/93 mmHg Patient Gender: M              HR:           58 bpm. Exam Location:  Inpatient Procedure: 2D Echo Indications:     chest pain  History:         Patient has no prior history of Echocardiogram examinations.                  Risk Factors:Hypertension and Dyslipidemia. Dysrhythmia.  Sonographer:     Jannett Celestine RDCS (AE) Referring Phys:  [id number] [facility] PATWARDHAN Diagnosing Phys: Vernell Leep MD IMPRESSIONS  1. Left ventricular ejection fraction, by estimation, is 60 to 65%. The left ventricle has normal function. The left ventricle has no regional wall motion abnormalities. Left ventricular diastolic parameters were normal.  2. Right ventricular systolic function is normal. The right ventricular size is normal.  3. No significant valvular abnormality.  4. Normal right atrial pressure. FINDINGS  Left Ventricle: Left ventricular ejection fraction, by estimation, is 60 to 65%. The left ventricle has normal function. The left ventricle has no regional wall motion abnormalities. The left ventricular internal cavity size was normal in size. There is  no left ventricular hypertrophy. Left ventricular diastolic parameters were normal. Right Ventricle: The right ventricular size is normal. No increase in right ventricular wall thickness. Right ventricular systolic function is normal. Left Atrium:  Left atrial size was normal in size. Right Atrium: Right atrial size was normal in size. Pericardium: There is no evidence of pericardial effusion. Mitral Valve: The mitral valve is normal in structure. No evidence of mitral valve regurgitation. Tricuspid Valve: The tricuspid valve is normal in structure. Tricuspid valve regurgitation is not demonstrated. Aortic Valve: The aortic valve is normal in structure. Aortic valve regurgitation is not visualized. Pulmonic Valve: The pulmonic valve was normal in structure. Pulmonic valve regurgitation is not visualized.  Aorta: The aortic root and ascending aorta are structurally normal, with no evidence of dilitation. Venous: The inferior vena cava is normal in size with greater than 50% respiratory variability, suggesting right atrial pressure of 3 mmHg. IAS/Shunts: No atrial level shunt detected by color flow Doppler.  LEFT VENTRICLE PLAX 2D LVIDd:         4.50 cm  Diastology LVIDs:         2.80 cm  LV e' lateral:   8.81 cm/s LV PW:         1.30 cm  LV E/e' lateral: 8.9 LV IVS:        1.10 cm  LV e' medial:    6.85 cm/s LVOT diam:     2.20 cm  LV E/e' medial:  11.4 LV SV:         71 LV SV Index:   34 LVOT Area:     3.80 cm  RIGHT VENTRICLE RV S prime:     10.80 cm/s TAPSE (M-mode): 1.7 cm LEFT ATRIUM             Index       RIGHT ATRIUM           Index LA diam:        3.70 cm 1.78 cm/m  RA Area:     11.20 cm LA Vol (A2C):   37.5 ml 18.02 ml/m RA Volume:   23.30 ml  11.19 ml/m LA Vol (A4C):   34.8 ml 16.72 ml/m LA Biplane Vol: 36.4 ml 17.49 ml/m  AORTIC VALVE LVOT Vmax:   83.60 cm/s LVOT Vmean:  58.600 cm/s LVOT VTI:    0.187 m  AORTA Ao Root diam: 3.50 cm MITRAL VALVE MV Area (PHT): 3.03 cm    SHUNTS MV Decel Time: 250 msec    Systemic VTI:  0.19 m MV E velocity: 78.00 cm/s  Systemic Diam: 2.20 cm MV A velocity: 65.60 cm/s MV E/A ratio:  1.19 Manish Patwardhan MD Electronically signed by Vernell Leep MD Signature Date/Time: 09/07/2019/10:55:18 AM    Final     Review of Systems  Constitutional: Positive for fatigue.  HENT: Negative.   Eyes: Negative.   Respiratory: Positive for chest tightness. Negative for shortness of breath.   Cardiovascular: Positive for chest pain. Negative for palpitations and leg swelling.  Gastrointestinal: Negative.   Endocrine: Negative.   Genitourinary: Negative.   Musculoskeletal: Negative.   Allergic/Immunologic: Negative.   Neurological: Negative for dizziness and syncope.  Hematological: Negative.   Psychiatric/Behavioral: Negative.    Blood pressure 139/74, pulse 62,  temperature 98.9 F (37.2 C), temperature source Oral, resp. rate (!) 24, height 5\' 11"  (1.803 m), weight 88 kg, SpO2 97 %. Physical Exam Constitutional:      Appearance: He is well-developed and normal weight.  HENT:     Head: Normocephalic and atraumatic.  Eyes:     Extraocular Movements: Extraocular movements intact.     Pupils: Pupils are equal, round, and reactive to light.  Neck:     Vascular: No JVD.  Cardiovascular:     Rate and Rhythm: Normal rate and regular rhythm.     Pulses: Normal pulses.     Heart sounds: Normal heart sounds. No murmur heard.   Pulmonary:     Effort: Pulmonary effort is normal.     Breath sounds: Normal breath sounds.  Abdominal:     General: Abdomen is flat.     Palpations: Abdomen is soft.     Tenderness: There is no abdominal tenderness.  Musculoskeletal:        General: No swelling.     Cervical back: Normal range of motion and neck supple.  Skin:    General: Skin is warm and dry.  Neurological:     General: No focal deficit present.     Mental Status: He is alert and oriented to person, place, and time.  Psychiatric:        Mood and Affect: Mood normal.        Behavior: Behavior normal.    Physicians  Panel Physicians Referring Physician Case Authorizing Physician  Patwardhan, Reynold Bowen, MD (Primary)    Procedures  LEFT HEART CATH AND CORONARY ANGIOGRAPHY  Conclusion  LM: Distal 40% stenosis w/severe calcification LAD: Ostial 90% stenosis with severe calcification LCx: No obvious lesion in LCx, but with TIMI II flow        OM1 long 90% stenosis RCA: Prox-mid 20% stenosis  Normal LVEDP Normal LVEF  Sub-optimal landing zone for LAD stent given severe calcification extending into distal LM. Recommend CVTS consult for CABG to LAD and OM1 Continue Aspirin/heparin. Patient has not received P2Y12i.   Nigel Mormon, MD Pager: [phone] Office: [phone]  Recommendations  Antiplatelet/Anticoag Continue  Aspirin/heparin CVTS consult for CABG  Procedural Details  Technical Details Procedures: 1. Ultrasound guided radial artery access 2. Selective left and right coronary angiography 3. Left heart catheterization 4. Conscious sedation monitoring 33 min  Indication: NSTEMI  History: 79 y/o Caucasian male with hypertension, hyperlipidemia, NSTEMI  Diagnostic Angiography: Catheter/s advances over guidewire under fluoroscopy Left coronary artery: 5 Fr JL 3.5  Right coronary artery: 5 Fr 3DRC Left heart catheterization: 5 Fr JR 4   Anticoagulation:  4500 units heparin  Total contrast used: 30 cc   Hemostasis: TR band  Total fluoro time: 5.2 min Air Kerma: 191 mGy  All wires and catheters removed out of the body at the end of the procedure Final angiogram showed no dissection/perforation  Complications: None       Estimated blood loss <50 mL.   During this procedure medications were administered to achieve and maintain moderate conscious sedation while the patient's heart rate, blood pressure, and oxygen saturation were continuously monitored and I was present face-to-face 100% of this time.  Medications (Filter: Administrations occurring from 1058 to 1156 on 09/07/19) (important) Continuous medications are totaled by the amount administered until 09/07/19 1156.  Heparin (Porcine) in NaCl 1000-0.9 UT/500ML-% SOLN (mL) Total volume:  500 mL Date/Time  Rate/Dose/Volume Action  09/07/19 1102  500 mL Given    Heparin (Porcine) in NaCl 1000-0.9 UT/500ML-% SOLN (mL) Total volume:  500 mL Date/Time  Rate/Dose/Volume Action  09/07/19 1102  500 mL Given    fentaNYL (SUBLIMAZE) injection (mcg) Total dose:  50 mcg Date/Time  Rate/Dose/Volume Action  09/07/19 1108  50 mcg Given    midazolam (VERSED) injection (mg) Total dose:  1 mg Date/Time  Rate/Dose/Volume Action  09/07/19 1109  1 mg Given  lidocaine (PF) (XYLOCAINE) 1 % injection (mL) Total volume:  2  mL Date/Time  Rate/Dose/Volume Action  09/07/19 1112  2 mL Given    Radial Cocktail/Verapamil only (mL) Total volume:  10 mL Date/Time  Rate/Dose/Volume Action  09/07/19 1116  10 mL Given    heparin sodium (porcine) injection (Units) Total dose:  4,500 Units Date/Time  Rate/Dose/Volume Action  09/07/19 1119  4,500 Units Given    iohexol (OMNIPAQUE) 350 MG/ML injection (mL) Total volume:  30 mL Date/Time  Rate/Dose/Volume Action  09/07/19 1145  30 mL Given    amLODipine (NORVASC) tablet 5 mg (mg) Total dose:  Cannot be calculated* Dosing weight:  88 *Administration dose not documented Date/Time  Rate/Dose/Volume Action  09/07/19 1058  *Not included in total MAR Hold    aspirin EC tablet 81 mg (mg) Total dose:  Cannot be calculated* Dosing weight:  88 *Administration dose not documented Date/Time  Rate/Dose/Volume Action  09/07/19 1058  *Not included in total MAR Hold    atorvastatin (LIPITOR) tablet 80 mg (mg) Total dose:  Cannot be calculated* Dosing weight:  88 *Administration dose not documented Date/Time  Rate/Dose/Volume Action  09/07/19 1058  *Not included in total MAR Hold    nitroGLYCERIN (NITROSTAT) SL tablet 0.4 mg (mg) Total dose:  Cannot be calculated* Dosing weight:  88 *Administration dose not documented Date/Time  Rate/Dose/Volume Action  09/07/19 1058  *Not included in total MAR Hold    polyvinyl alcohol (LIQUIFILM TEARS) 1.4 % ophthalmic solution 1 drop (drop) Total dose:  Cannot be calculated* Dosing weight:  88 *Administration dose not documented Date/Time  Rate/Dose/Volume Action  09/07/19 1058  *Not included in total MAR Hold    acetaminophen (TYLENOL) tablet 650 mg (mg) Total dose:  Cannot be calculated* Dosing weight:  88 *Administration dose not documented Date/Time  Rate/Dose/Volume Action  09/07/19 1058  *Not included in total MAR Hold    ondansetron (ZOFRAN) injection 4 mg (mg) Total dose:  Cannot be calculated* Dosing weight:   88 *Administration dose not documented Date/Time  Rate/Dose/Volume Action  09/07/19 1058  *Not included in total MAR Hold    Sedation Time  Sedation Time Physician-1: 33 minutes 29 seconds  Contrast  Medication Name Total Dose  iohexol (OMNIPAQUE) 350 MG/ML injection 30 mL    Radiation/Fluoro  Fluoro time: 5.1 (min) DAP: 10910 (mGycm2) Cumulative Air Kerma: 371 (mGy)  Complications  Complications documented before study signed (09/07/2019 12:21 PM)   LEFT HEART CATH AND CORONARY ANGIOGRAPHY  None Documented by Nigel Mormon, MD 09/07/2019 12:17 PM  Date Found: 09/07/2019  Time Range: Intraprocedure      Coronary Findings  Diagnostic Dominance: Right Left Main  Dist LM lesion is 40% stenosed. The lesion is severely calcified.  Dist LM to Ost LAD lesion is 90% stenosed. The lesion is severely calcified.  Left Circumflex  First Obtuse Marginal Branch  1st Mrg lesion is 90% stenosed.  Right Coronary Artery  Prox RCA to Mid RCA lesion is 20% stenosed.  Intervention  No interventions have been documented. Left Heart  Left Ventricle LV end diastolic pressure is normal.  Coronary Diagrams  Diagnostic Dominance: Right  Intervention  Implants   No implant documentation for this case.  Syngo Images  Show images for CARDIAC CATHETERIZATION Images on Long Term Storage  Show images for Sanjeev, Main to Procedure Log  Procedure Log    Hemo Data   Most Recent Value  AO Systolic Pressure 062 mmHg  AO Diastolic Pressure  65 mmHg  AO Mean 88 mmHg  LV Systolic Pressure 601 mmHg  LV Diastolic Pressure 1 mmHg  LV EDP 15 mmHg  AOp Systolic Pressure 093 mmHg  AOp Diastolic Pressure 57 mmHg  AOp Mean Pressure 79 mmHg  LVp Systolic Pressure 235 mmHg  LVp Diastolic Pressure 1 mmHg  LVp EDP Pressure 17 mmHg  ECHOCARDIOGRAM REPORT       Patient Name:  AYUSH BOULET Date of Exam: 09/07/2019  Medical Rec #: [id number]   Height:    71.0 in   Accession #:  [id number]   Weight:    194.0 lb  Date of Birth: 1940-04-29    BSA:     2.081 m  Patient Age:  21 years    BP:      131/93 mmHg  Patient Gender: M       HR:      58 bpm.  Exam Location: Inpatient   Procedure: 2D Echo   Indications:   chest pain    History:     Patient has no prior history of Echocardiogram  examinations.          Risk Factors:Hypertension and Dyslipidemia. Dysrhythmia.    Sonographer:   Jannett Celestine RDCS (AE)  Referring Phys: [id number] [facility] PATWARDHAN  Diagnosing Phys: Vernell Leep MD   IMPRESSIONS    1. Left ventricular ejection fraction, by estimation, is 60 to 65%. The  left ventricle has normal function. The left ventricle has no regional  wall motion abnormalities. Left ventricular diastolic parameters were  normal.  2. Right ventricular systolic function is normal. The right ventricular  size is normal.  3. No significant valvular abnormality.  4. Normal right atrial pressure.   FINDINGS  Left Ventricle: Left ventricular ejection fraction, by estimation, is 60  to 65%. The left ventricle has normal function. The left ventricle has no  regional wall motion abnormalities. The left ventricular internal cavity  size was normal in size. There is  no left ventricular hypertrophy. Left ventricular diastolic parameters  were normal.   Right Ventricle: The right ventricular size is normal. No increase in  right ventricular wall thickness. Right ventricular systolic function is  normal.   Left Atrium: Left atrial size was normal in size.   Right Atrium: Right atrial size was normal in size.   Pericardium: There is no evidence of pericardial effusion.   Mitral Valve: The mitral valve is normal in structure. No evidence of  mitral valve regurgitation.   Tricuspid Valve: The tricuspid valve is normal in structure. Tricuspid  valve regurgitation is not demonstrated.    Aortic Valve: The aortic valve is normal in structure. Aortic valve  regurgitation is not visualized.   Pulmonic Valve: The pulmonic valve was normal in structure. Pulmonic valve  regurgitation is not visualized.   Aorta: The aortic root and ascending aorta are structurally normal, with  no evidence of dilitation.   Venous: The inferior vena cava is normal in size with greater than 50%  respiratory variability, suggesting right atrial pressure of 3 mmHg.   IAS/Shunts: No atrial level shunt detected by color flow Doppler.     LEFT VENTRICLE  PLAX 2D  LVIDd:     4.50 cm Diastology  LVIDs:     2.80 cm LV e' lateral:  8.81 cm/s  LV PW:     1.30 cm LV E/e' lateral: 8.9  LV IVS:    1.10 cm LV e' medial:  6.85 cm/s  LVOT diam:  2.20 cm LV E/e' medial: 11.4  LV SV:     71  LV SV Index:  34  LVOT Area:   3.80 cm     RIGHT VENTRICLE  RV S prime:   10.80 cm/s  TAPSE (M-mode): 1.7 cm   LEFT ATRIUM       Index    RIGHT ATRIUM      Index  LA diam:    3.70 cm 1.78 cm/m RA Area:   11.20 cm  LA Vol (A2C):  37.5 ml 18.02 ml/m RA Volume:  23.30 ml 11.19 ml/m  LA Vol (A4C):  34.8 ml 16.72 ml/m  LA Biplane Vol: 36.4 ml 17.49 ml/m  AORTIC VALVE  LVOT Vmax:  83.60 cm/s  LVOT Vmean: 58.600 cm/s  LVOT VTI:  0.187 m    AORTA  Ao Root diam: 3.50 cm   MITRAL VALVE  MV Area (PHT): 3.03 cm  SHUNTS  MV Decel Time: 250 msec  Systemic VTI: 0.19 m  MV E velocity: 78.00 cm/s Systemic Diam: 2.20 cm  MV A velocity: 65.60 cm/s  MV E/A ratio: 1.19   Manish Patwardhan MD  Electronically signed by Vernell Leep MD  Signature Date/Time: 09/07/2019/10:55:18 AM     Assessment/Plan:  This active 79 year old gentleman has high-grade calcified ostial LAD and high-grade calcified proximal first marginal stenoses with mild distal left main tapering.  He presented with unstable angina and ruled in for non-ST segment  elevation MI.  I agree that coronary artery bypass graft surgery with a left internal mammary graft to the LAD and a graft to his first marginal is the best treatment for him.  PCI and stenting of his calcified ostial LAD stenosis would have increased risk due to the calcified lesion extending back into the left main. I discussed the operative procedure with the patient, wife and son including alternatives, benefits and risks; including but not limited to bleeding, blood transfusion, infection, stroke, myocardial infarction, graft failure, heart block requiring a permanent pacemaker, organ dysfunction, and death.  Leretha Pol understands and agrees to proceed.  We will schedule surgery for Monday morning.  He should remain in the hospital over the weekend on intravenous heparin.  I spent 40 minutes performing this consultation and > 50% of this time was spent face to face counseling and coordinating the care of this patient's severe multivessel coronary disease.  Gaye Pollack 09/07/2019, 3:06 PM

## 2019-09-08 ENCOUNTER — Inpatient Hospital Stay (HOSPITAL_COMMUNITY): Payer: Medicare HMO

## 2019-09-08 ENCOUNTER — Encounter (HOSPITAL_COMMUNITY): Payer: Self-pay | Admitting: Cardiology

## 2019-09-08 DIAGNOSIS — Z01810 Encounter for preprocedural cardiovascular examination: Secondary | ICD-10-CM

## 2019-09-08 DIAGNOSIS — I2510 Atherosclerotic heart disease of native coronary artery without angina pectoris: Secondary | ICD-10-CM

## 2019-09-08 LAB — CBC
HCT: 41.3 % (ref 39.0–52.0)
Hemoglobin: 13.8 g/dL (ref 13.0–17.0)
MCH: 30.9 pg (ref 26.0–34.0)
MCHC: 33.4 g/dL (ref 30.0–36.0)
MCV: 92.6 fL (ref 80.0–100.0)
Platelets: 173 10*3/uL (ref 150–400)
RBC: 4.46 MIL/uL (ref 4.22–5.81)
RDW: 12.6 % (ref 11.5–15.5)
WBC: 12 10*3/uL — ABNORMAL HIGH (ref 4.0–10.5)
nRBC: 0 % (ref 0.0–0.2)

## 2019-09-08 LAB — PULMONARY FUNCTION TEST
FEF 25-75 Pre: 1.52 L/sec
FEF2575-%Pred-Pre: 68 %
FEV1-%Pred-Pre: 65 %
FEV1-Pre: 2.04 L
FEV1FVC-%Pred-Pre: 101 %
FEV6-%Pred-Pre: 67 %
FEV6-Pre: 2.73 L
FEV6FVC-%Pred-Pre: 104 %
FVC-%Pred-Pre: 64 %
FVC-Pre: 2.79 L
Pre FEV1/FVC ratio: 73 %
Pre FEV6/FVC Ratio: 98 %

## 2019-09-08 LAB — BASIC METABOLIC PANEL
Anion gap: 10 (ref 5–15)
BUN: 15 mg/dL (ref 8–23)
CO2: 22 mmol/L (ref 22–32)
Calcium: 8.4 mg/dL — ABNORMAL LOW (ref 8.9–10.3)
Chloride: 106 mmol/L (ref 98–111)
Creatinine, Ser: 1.23 mg/dL (ref 0.61–1.24)
GFR calc Af Amer: >60 mL/min (ref >60)
GFR calc non Af Amer: 56 mL/min — ABNORMAL LOW (ref >60)
Glucose, Bld: 171 mg/dL — ABNORMAL HIGH (ref 70–99)
Potassium: 3.7 mmol/L (ref 3.5–5.1)
Sodium: 138 mmol/L (ref 135–145)

## 2019-09-08 LAB — HEPARIN LEVEL (UNFRACTIONATED)
Heparin Unfractionated: 0.32 IU/mL (ref 0.30–0.70)
Heparin Unfractionated: 0.51 IU/mL (ref 0.30–0.70)

## 2019-09-08 MED ORDER — METOPROLOL SUCCINATE ER 25 MG PO TB24
25.0000 mg | ORAL_TABLET | Freq: Every day | ORAL | Status: DC
  Administered 2019-09-08 – 2019-09-10 (×3): 25 mg via ORAL
  Filled 2019-09-08 (×3): qty 1

## 2019-09-08 MED ORDER — SERTRALINE HCL 50 MG PO TABS
50.0000 mg | ORAL_TABLET | Freq: Every day | ORAL | Status: DC
  Administered 2019-09-08 – 2019-09-10 (×3): 50 mg via ORAL
  Filled 2019-09-08 (×3): qty 1

## 2019-09-08 NOTE — Progress Notes (Signed)
[place] for heparin Indication: chest pain/ACS  Allergies  Allergen Reactions  . Tetracycline Other (See Comments)    Tightness of chest. Per patient was given in combination with another medication. Not sure of name.  . Streptomycin Other (See Comments)    Tightness in chest 1970  . Sulfonamide Derivatives Rash    Upper arms and stomach    Patient Measurements: Height: 5\' 11"  (180.3 cm) Weight: 87.2 kg (192 lb 4.5 oz) IBW/kg (Calculated) : 75.3 Heparin Dosing Weight: 88kg  Vital Signs: Temp: 98.8 F (37.1 C) (08/20 1017) Temp Source: Oral (08/20 1017) BP: 135/82 (08/20 1017) Pulse Rate: 68 (08/20 1017)  Labs: Recent Labs    09/06/19 1729 09/06/19 1729 09/06/19 2040 09/07/19 0428 09/08/19 0408 09/08/19 0903  HGB 14.9   < >  --  15.4 13.8  --   HCT 45.4  --   --  47.2 41.3  --   PLT 213  --   --  201 173  --   HEPARINUNFRC  --   --   --  0.65 0.32 0.51  CREATININE 1.15  --   --  1.03  --  1.23  TROPONINIHS 7,879*  --  9,991*  --   --   --    < > = values in this interval not displayed.    Estimated Creatinine Clearance: 53.6 mL/min (by C-G formula based on SCr of 1.23 mg/dL).   Assessment: 40 yom presented to the ED with CP and started on IV heparin, no anticoagulation PTA. Pt now s/p LHC with inability to intervene, pharmacy to continue IV heparin while CVTS evaluates for CABG.  Heparin level remains therapeutic, CBC stable, CABG Monday.  Goal of Therapy:  Heparin level 0.3-0.7 units/ml Monitor platelets by anticoagulation protocol: Yes   Plan:  Continue heparin 1100 units/hr  Daily heparin level and CBC   Arrie Senate, PharmD, BCPS Clinical Pharmacist [phone] Please check AMION for all [place] numbers 09/08/2019

## 2019-09-08 NOTE — Progress Notes (Signed)
      [address]       [place] [address]             [phone]      Patient doing okay.  Denies current chest pain.  Some questions addressed regarding EVH and Recovery time.  On Heparin and NTG drip.  For CABG Monday with Dr. Bartholome Bill, PA-C

## 2019-09-08 NOTE — Progress Notes (Signed)
[place] for heparin Indication: chest pain/ACS  Allergies  Allergen Reactions  . Tetracycline Other (See Comments)    Tightness of chest. Per patient was given in combination with another medication. Not sure of name.  . Streptomycin Other (See Comments)    Tightness in chest 1970  . Sulfonamide Derivatives Rash    Upper arms and stomach    Patient Measurements: Height: 5\' 11"  (180.3 cm) Weight: 87.2 kg (192 lb 4.5 oz) IBW/kg (Calculated) : 75.3 Heparin Dosing Weight: 88kg  Vital Signs: Temp: 98.7 F (37.1 C) (08/20 0424) Temp Source: Oral (08/20 0424) BP: 136/77 (08/19 2356) Pulse Rate: 71 (08/20 0424)  Labs: Recent Labs    09/06/19 1729 09/06/19 1729 09/06/19 2040 09/07/19 0428 09/08/19 0408  HGB 14.9   < >  --  15.4 13.8  HCT 45.4  --   --  47.2 41.3  PLT 213  --   --  201 173  HEPARINUNFRC  --   --   --  0.65 0.32  CREATININE 1.15  --   --  1.03  --   TROPONINIHS 7,879*  --  9,991*  --   --    < > = values in this interval not displayed.    Estimated Creatinine Clearance: 64 mL/min (by C-G formula based on SCr of 1.03 mg/dL).   Assessment: 35 yom presented to the ED with CP and started on IV heparin, no anticoagulation PTA. Pt now s/p LHC with inability to intervene, pharmacy to continue IV heparin while CVTS evaluates for CABG.  Heparin level therapeutic (0.32) on gtt at 1100 units/hr. No bleeding noted.  Goal of Therapy:  Heparin level 0.3-0.7 units/ml Monitor platelets by anticoagulation protocol: Yes   Plan:  Continue heparin 1100 units/hr  F/u 8 hr heparin level  Sherlon Handing, PharmD, BCPS Please see amion for complete clinical pharmacist phone list 09/08/2019

## 2019-09-08 NOTE — Progress Notes (Addendum)
[id number] Gave pt IS and he demonstrated 1500-2000 ml with instruction. Encouraged IS and mobility after surgery. Discussed sternal precautions and staying in the tube. Gave OHS booklet and wrote down how to view pre op video. Pt and wife voiced understanding. Wife will be with pt after discharge. Will follow up after surgery.. Encouraged pt to use IS in preparation of surgery. Graylon Good RN BSN 09/08/2019 1:32 PM

## 2019-09-08 NOTE — Progress Notes (Signed)
Pre-CABG Dopplers completed. Refer to "CV Proc" under chart review to view preliminary results.  09/08/2019 12:46 PM Kelby Aline., MHA, RVT, RDCS, RDMS

## 2019-09-08 NOTE — Progress Notes (Signed)
1 Day Post-Op Procedure(s) (LRB): LEFT HEART CATH AND CORONARY ANGIOGRAPHY (N/A) Subjective:  Feels ok but constipated.  Says he had 0.5 out of 10 chest pain today on NTG 10 mcg and heparin drip.  Objective: Vital signs in last 24 hours: Temp:  [98.7 F (37.1 C)-98.8 F (37.1 C)] 98.8 F (37.1 C) (08/20 1017) Pulse Rate:  [61-79] 68 (08/20 1017) Cardiac Rhythm: Normal sinus rhythm (08/20 1017) Resp:  [19-20] 20 (08/20 0424) BP: (118-147)/(73-82) 135/82 (08/20 1017) SpO2:  [96 %-98 %] 98 % (08/20 0424) Weight:  [87.2 kg] 87.2 kg (08/20 0424)  Hemodynamic parameters for last 24 hours:    Intake/Output from previous day: 08/19 0701 - 08/20 0700 In: 101.9 [I.V.:101.9] Out: -  Intake/Output this shift: Total I/O In: 576.2 [P.O.:444; I.V.:132.2] Out: 450 [Urine:450]  General appearance: alert and cooperative Heart: regular rate and rhythm, S1, S2 normal, no murmur Lungs: clear to auscultation bilaterally  Lab Results: Recent Labs    09/07/19 0428 09/08/19 0408  WBC 9.7 12.0*  HGB 15.4 13.8  HCT 47.2 41.3  PLT 201 173   BMET:  Recent Labs    09/07/19 0428 09/08/19 0903  NA 139 138  K 4.1 3.7  CL 105 106  CO2 24 22  GLUCOSE 100* 171*  BUN 16 15  CREATININE 1.03 1.23  CALCIUM 8.8* 8.4*    PT/INR: No results for input(s): LABPROT, INR in the last 72 hours. ABG No results found for: PHART, HCO3, TCO2, ACIDBASEDEF, O2SAT CBG (last 3)  No results for input(s): GLUCAP in the last 72 hours.  Assessment/Plan: Severe 2 vessel CAD with NSTEMI. Plan CABG Monday. If he has any escalation of chest pain requiring more NTG this weekend then I would plan to proceed this weekend. Wife at bedside and I answered her questions.  LOS: 2 days    Gaye Pollack 09/08/2019

## 2019-09-08 NOTE — Progress Notes (Signed)
Subjective:  Occasional chest pain On nitro 10  Objective:  Vital Signs in the last 24 hours: Temp:  [98.7 F (37.1 C)-98.9 F (37.2 C)] 98.8 F (37.1 C) (08/20 1017) Pulse Rate:  [55-79] 68 (08/20 1017) Resp:  [16-24] 20 (08/20 0424) BP: (113-147)/(65-98) 135/82 (08/20 1017) SpO2:  [94 %-99 %] 98 % (08/20 0424) Weight:  [87.2 kg] 87.2 kg (08/20 0424)  Intake/Output from previous day: 08/19 0701 - 08/20 0700 In: 101.9 [I.V.:101.9] Out: -   Physical Exam Vitals and nursing note reviewed.  Constitutional:      General: He is not in acute distress. Neck:     Vascular: No JVD.  Cardiovascular:     Rate and Rhythm: Normal rate and regular rhythm.     Heart sounds: Normal heart sounds. No murmur heard.   Pulmonary:     Effort: Pulmonary effort is normal.     Breath sounds: Normal breath sounds. No wheezing or rales.      Lab Results: BMP Recent Labs    09/06/19 1729 09/07/19 0428 09/08/19 0903  NA 137 139 138  K 4.2 4.1 3.7  CL 103 105 106  CO2 27 24 22   GLUCOSE 105* 100* 171*  BUN 14 16 15   CREATININE 1.15 1.03 1.23  CALCIUM 8.9 8.8* 8.4*  GFRNONAA >60 >60 56*  GFRAA >60 >60 >60    CBC Recent Labs  Lab 09/08/19 0408  WBC 12.0*  RBC 4.46  HGB 13.8  HCT 41.3  PLT 173  MCV 92.6  MCH 30.9  MCHC 33.4  RDW 12.6    HEMOGLOBIN A1C Lab Results  Component Value Date   HGBA1C 5.7 (H) 09/07/2019   MPG 116.89 09/07/2019    Cardiac Panel (last 3 results) No results for input(s): CKTOTAL, CKMB, TROPONINI, RELINDX in the last 8760 hours.  BNP (last 3 results) No results for input(s): BNP in the last 8760 hours.  TSH No results for input(s): TSH in the last 8760 hours.  Lipid Panel     Component Value Date/Time   CHOL 171 09/07/2019 0428   TRIG 49 09/07/2019 0428   HDL 50 09/07/2019 0428   CHOLHDL 3.4 09/07/2019 0428   VLDL 10 09/07/2019 0428   LDLCALC 111 (H) 09/07/2019 0428   Results for NAHUN, KRONBERG (MRN [id number]) as of 09/08/2019  12:04  Ref. Range 09/06/2019 17:29 09/06/2019 20:40  Troponin I (High Sensitivity) Latest Ref Range: <18 ng/L 7,879 [facility]) 9,991 [facility])    Cardiac Studies:  EKG 09/06/2019: Sinus rhythm. Old anteroseptal infarct. Lateral nonspecific ST-T changes  Coronary angiography 09/07/2019: LM: Distal 40% stenosis w/severe calcification LAD: Ostial 90% stenosis with severe calcification LCx: No obvious lesion in LCx, but with TIMI II flow        OM1 long 90% stenosis RCA: Prox-mid 20% stenosis  Normal LVEDP Normal LVEF  Sub-optimal landing zone for LAD stent given severe calcification extending into distal LM. Recommend CVTS consult for CABG to LAD and OM1 Continue Aspirin/heparin. Patient has not received P2Y12i.  Echocardiogram 09/07/2019: 1. Left ventricular ejection fraction, by estimation, is 60 to 65%. The  left ventricle has normal function. The left ventricle has no regional  wall motion abnormalities. Left ventricular diastolic parameters were  normal.  2. Right ventricular systolic function is normal. The right ventricular  size is normal.  3. No significant valvular abnormality.  4. Normal right atrial pressure.   Assessment & Recommendations:  79 y.o. Caucasian male  with hypertension, hyperlipidemia, NSTEMI  NSTEMI:  Severe ostial LAD, mild distal LM calcific stenosis, high grade OM1 stenosis Awaiting 2 vessel CABG on Monday Conitnue Aspirin, heparin,nitro, statin Added metoprolol succinate 25 mg  Hypertension: Controlled  Hyperlipidemia: Added high intensity statin   Carlos Mormon, MD Pager: [phone] Office: [phone]

## 2019-09-09 DIAGNOSIS — E782 Mixed hyperlipidemia: Secondary | ICD-10-CM

## 2019-09-09 DIAGNOSIS — K5900 Constipation, unspecified: Secondary | ICD-10-CM

## 2019-09-09 DIAGNOSIS — I249 Acute ischemic heart disease, unspecified: Secondary | ICD-10-CM

## 2019-09-09 DIAGNOSIS — I6523 Occlusion and stenosis of bilateral carotid arteries: Secondary | ICD-10-CM

## 2019-09-09 DIAGNOSIS — I10 Essential (primary) hypertension: Secondary | ICD-10-CM

## 2019-09-09 DIAGNOSIS — I2510 Atherosclerotic heart disease of native coronary artery without angina pectoris: Secondary | ICD-10-CM

## 2019-09-09 LAB — CBC
HCT: 40.9 % (ref 39.0–52.0)
Hemoglobin: 13.7 g/dL (ref 13.0–17.0)
MCH: 31.1 pg (ref 26.0–34.0)
MCHC: 33.5 g/dL (ref 30.0–36.0)
MCV: 92.7 fL (ref 80.0–100.0)
Platelets: 164 10*3/uL (ref 150–400)
RBC: 4.41 MIL/uL (ref 4.22–5.81)
RDW: 12.6 % (ref 11.5–15.5)
WBC: 12.4 10*3/uL — ABNORMAL HIGH (ref 4.0–10.5)
nRBC: 0 % (ref 0.0–0.2)

## 2019-09-09 LAB — HEPARIN LEVEL (UNFRACTIONATED): Heparin Unfractionated: 0.34 IU/mL (ref 0.30–0.70)

## 2019-09-09 MED ORDER — DOCUSATE SODIUM 100 MG PO CAPS
100.0000 mg | ORAL_CAPSULE | Freq: Two times a day (BID) | ORAL | Status: DC
  Administered 2019-09-09 – 2019-09-10 (×2): 100 mg via ORAL
  Filled 2019-09-09 (×3): qty 1

## 2019-09-09 MED ORDER — MAGNESIUM HYDROXIDE 400 MG/5ML PO SUSP
10.0000 mL | Freq: Once | ORAL | Status: AC
  Administered 2019-09-09: 10 mL via ORAL
  Filled 2019-09-09: qty 30

## 2019-09-09 MED ORDER — DEXMEDETOMIDINE HCL IN NACL 400 MCG/100ML IV SOLN
0.1000 ug/kg/h | INTRAVENOUS | Status: AC
  Administered 2019-09-11: .2 ug/kg/h via INTRAVENOUS
  Filled 2019-09-09: qty 100

## 2019-09-09 MED ORDER — PHENYLEPHRINE HCL-NACL 20-0.9 MG/250ML-% IV SOLN
30.0000 ug/min | INTRAVENOUS | Status: AC
  Administered 2019-09-11: 30 ug/min via INTRAVENOUS
  Filled 2019-09-09: qty 250

## 2019-09-09 MED ORDER — SODIUM CHLORIDE 0.9 % IV SOLN
750.0000 mg | INTRAVENOUS | Status: AC
  Administered 2019-09-11: 750 mg via INTRAVENOUS
  Filled 2019-09-09: qty 750

## 2019-09-09 MED ORDER — POTASSIUM CHLORIDE 2 MEQ/ML IV SOLN
80.0000 meq | INTRAVENOUS | Status: DC
  Filled 2019-09-09: qty 40

## 2019-09-09 MED ORDER — PLASMA-LYTE 148 IV SOLN
INTRAVENOUS | Status: DC
  Filled 2019-09-09: qty 2.5

## 2019-09-09 MED ORDER — MAGNESIUM HYDROXIDE 400 MG/5ML PO SUSP
5.0000 mL | Freq: Once | ORAL | Status: AC
  Administered 2019-09-09: 5 mL via ORAL
  Filled 2019-09-09: qty 30

## 2019-09-09 MED ORDER — TRANEXAMIC ACID (OHS) PUMP PRIME SOLUTION
2.0000 mg/kg | INTRAVENOUS | Status: DC
  Filled 2019-09-09: qty 1.72

## 2019-09-09 MED ORDER — MILRINONE LACTATE IN DEXTROSE 20-5 MG/100ML-% IV SOLN
0.3000 ug/kg/min | INTRAVENOUS | Status: DC
  Filled 2019-09-09: qty 100

## 2019-09-09 MED ORDER — VANCOMYCIN HCL 1500 MG/300ML IV SOLN
1500.0000 mg | INTRAVENOUS | Status: AC
  Administered 2019-09-11: 1500 mg via INTRAVENOUS
  Filled 2019-09-09: qty 300

## 2019-09-09 MED ORDER — NITROGLYCERIN IN D5W 200-5 MCG/ML-% IV SOLN
2.0000 ug/min | INTRAVENOUS | Status: DC
  Filled 2019-09-09: qty 250

## 2019-09-09 MED ORDER — MAGNESIUM SULFATE 50 % IJ SOLN
40.0000 meq | INTRAMUSCULAR | Status: DC
  Filled 2019-09-09: qty 9.85

## 2019-09-09 MED ORDER — INSULIN REGULAR(HUMAN) IN NACL 100-0.9 UT/100ML-% IV SOLN
INTRAVENOUS | Status: AC
  Administered 2019-09-11: 1 [IU]/h via INTRAVENOUS
  Filled 2019-09-09: qty 100

## 2019-09-09 MED ORDER — VANCOMYCIN HCL 1250 MG/250ML IV SOLN: 1250.0000 mg | INTRAVENOUS | Status: DC

## 2019-09-09 MED ORDER — EPINEPHRINE HCL 5 MG/250ML IV SOLN IN NS
0.0000 ug/min | INTRAVENOUS | Status: DC
  Filled 2019-09-09: qty 250

## 2019-09-09 MED ORDER — TRANEXAMIC ACID (OHS) BOLUS VIA INFUSION
15.0000 mg/kg | INTRAVENOUS | Status: AC
  Administered 2019-09-11: 1290 mg via INTRAVENOUS
  Filled 2019-09-09: qty 1290

## 2019-09-09 MED ORDER — NOREPINEPHRINE 4 MG/250ML-% IV SOLN
0.0000 ug/min | INTRAVENOUS | Status: DC
  Filled 2019-09-09: qty 250

## 2019-09-09 MED ORDER — SODIUM CHLORIDE 0.9 % IV SOLN
INTRAVENOUS | Status: DC
  Filled 2019-09-09: qty 30

## 2019-09-09 MED ORDER — TRANEXAMIC ACID 1000 MG/10ML IV SOLN
1.5000 mg/kg/h | INTRAVENOUS | Status: AC
  Administered 2019-09-11: 1.5 mg/kg/h via INTRAVENOUS
  Filled 2019-09-09: qty 25

## 2019-09-09 MED ORDER — SODIUM CHLORIDE 0.9 % IV SOLN
1.5000 g | INTRAVENOUS | Status: AC
  Administered 2019-09-11: 1.5 g via INTRAVENOUS
  Filled 2019-09-09: qty 1.5

## 2019-09-09 NOTE — Plan of Care (Signed)

## 2019-09-09 NOTE — Progress Notes (Signed)
[place] for heparin Indication: chest pain/ACS  Allergies  Allergen Reactions  . Tetracycline Other (See Comments)    Tightness of chest. Per patient was given in combination with another medication. Not sure of name.  . Streptomycin Other (See Comments)    Tightness in chest 1970  . Sulfonamide Derivatives Rash    Upper arms and stomach    Patient Measurements: Height: 5\' 11"  (180.3 cm) Weight: 86 kg (189 lb 8 oz) IBW/kg (Calculated) : 75.3 Heparin Dosing Weight: 88kg  Vital Signs: Temp: 98.2 F (36.8 C) (08/21 0736) Temp Source: Oral (08/21 0736) BP: 142/74 (08/21 0736) Pulse Rate: 81 (08/21 0736)  Labs: Recent Labs    09/06/19 1729 09/06/19 1729 09/06/19 2040 09/07/19 0428 09/07/19 0428 09/08/19 0408 09/08/19 0903 09/09/19 0201  HGB 14.9   < >  --  15.4   < > 13.8  --  13.7  HCT 45.4   < >  --  47.2  --  41.3  --  40.9  PLT 213   < >  --  201  --  173  --  164  HEPARINUNFRC  --   --   --  0.65   < > 0.32 0.51 0.34  CREATININE 1.15  --   --  1.03  --   --  1.23  --   TROPONINIHS 7,879*  --  9,991*  --   --   --   --   --    < > = values in this interval not displayed.    Estimated Creatinine Clearance: 53.6 mL/min (by C-G formula based on SCr of 1.23 mg/dL).   Assessment: 24 yom presented to the ED with CP and started on IV heparin, no anticoagulation PTA. Pt now s/p LHC with inability to intervene, pharmacy to continue IV heparin while CVTS evaluates for CABG.  Heparin level remains therapeutic on 1100 units/hr, CBC stable, CABG planned for Monday.  Goal of Therapy:  Heparin level 0.3-0.7 units/ml Monitor platelets by anticoagulation protocol: Yes   Plan:  Continue heparin 1100 units/hr  Daily heparin level and CBC  Rebbeca Paul, PharmD PGY1 Pharmacy Resident 09/09/2019 8:18 AM  Please check [email or web address] for unit-specific pharmacy phone numbers.

## 2019-09-09 NOTE — Progress Notes (Signed)
Subjective:  Patient seen and examined at bedside at approximately 11 AM. Patient is accompanied by his daughter came at bedside. Patient states his chest pain is well controlled with nitro and heparin drip. He is awaiting surgery on Monday.  Review of systems positive for constipation.  Objective:  Vital Signs in the last 24 hours: Temp:  [98.2 F (36.8 C)-99.2 F (37.3 C)] 98.2 F (36.8 C) (08/21 0736) Pulse Rate:  [69-81] 81 (08/21 0736) Resp:  [18] 18 (08/21 0629) BP: (126-142)/(69-84) 142/74 (08/21 0736) SpO2:  [95 %-98 %] 96 % (08/21 0736) Weight:  [86 kg] 86 kg (08/21 0629)  Intake/Output from previous day: 08/20 0701 - 08/21 0700 In: 998.2 [P.O.:866; I.V.:132.2] Out: 850 [Urine:850]  Physical Exam Vitals and nursing note reviewed.  Constitutional:      General: He is not in acute distress. Neck:     Vascular: No JVD.  Cardiovascular:     Rate and Rhythm: Normal rate and regular rhythm.     Heart sounds: Normal heart sounds. No murmur heard.   Pulmonary:     Effort: Pulmonary effort is normal.     Breath sounds: Normal breath sounds. No wheezing or rales.    Lab Results: BMP Recent Labs    09/06/19 1729 09/07/19 0428 09/08/19 0903  NA 137 139 138  K 4.2 4.1 3.7  CL 103 105 106  CO2 27 24 22   GLUCOSE 105* 100* 171*  BUN 14 16 15   CREATININE 1.15 1.03 1.23  CALCIUM 8.9 8.8* 8.4*  GFRNONAA >60 >60 56*  GFRAA >60 >60 >60    CBC Recent Labs  Lab 09/09/19 0201  WBC 12.4*  RBC 4.41  HGB 13.7  HCT 40.9  PLT 164  MCV 92.7  MCH 31.1  MCHC 33.5  RDW 12.6    HEMOGLOBIN A1C Lab Results  Component Value Date   HGBA1C 5.7 (H) 09/07/2019   MPG 116.89 09/07/2019    Cardiac Panel (last 3 results) No results for input(s): CKTOTAL, CKMB, TROPONINI, RELINDX in the last 8760 hours.  BNP (last 3 results) No results for input(s): BNP in the last 8760 hours.  TSH No results for input(s): TSH in the last 8760 hours.  Lipid Panel     Component  Value Date/Time   CHOL 171 09/07/2019 0428   TRIG 49 09/07/2019 0428   HDL 50 09/07/2019 0428   CHOLHDL 3.4 09/07/2019 0428   VLDL 10 09/07/2019 0428   LDLCALC 111 (H) 09/07/2019 0428   Results for TAIQUAN, CAMPANARO (MRN [id number]) as of 09/08/2019 12:04  Ref. Range 09/06/2019 17:29 09/06/2019 20:40  Troponin I (High Sensitivity) Latest Ref Range: <18 ng/L 7,879 [facility]) 9,991 [facility])    Cardiac Studies:  EKG 09/06/2019: Sinus rhythm. Old anteroseptal infarct. Lateral nonspecific ST-T changes  Coronary angiography 09/07/2019: LM: Distal 40% stenosis w/severe calcification LAD: Ostial 90% stenosis with severe calcification LCx: No obvious lesion in LCx, but with TIMI II flow        OM1 long 90% stenosis RCA: Prox-mid 20% stenosis  Normal LVEDP Normal LVEF  Sub-optimal landing zone for LAD stent given severe calcification extending into distal LM. Recommend CVTS consult for CABG to LAD and OM1 Continue Aspirin/heparin. Patient has not received P2Y12i.  Echocardiogram 09/07/2019: 1. Left ventricular ejection fraction, by estimation, is 60 to 65%. The  left ventricle has normal function. The left ventricle has no regional  wall motion abnormalities. Left ventricular diastolic parameters were  normal.  2. Right ventricular systolic function  is normal. The right ventricular  size is normal.  3. No significant valvular abnormality.  4. Normal right atrial pressure.     US Doppler pre CABG (carotid and arms) 09/08/2019: Right Carotid: Velocities in the right ICA are consistent with a 1-39% stenosis.   Left Carotid: Velocities in the left ICA are consistent with a 1-39% stenosis.  Vertebrals: Bilateral vertebral arteries demonstrate antegrade flow.  Subclavians: Normal flow hemodynamics were seen in bilateral subclavian arteries.   Right Upper Extremity: Palmar arch not evaluated due to recent cardiac catheterization.   Left Upper Extremity: Doppler waveforms remain within normal  limits with left radial compression. Doppler waveform obliterate with left ulnar compression.    Pedal waveforms: bilateral pedal artery waveforms are within normal limits at rest.    Assessment & Recommendations:  79 y.o. Caucasian male  with hypertension, hyperlipidemia, NSTEMI  NSTEMI: Severe ostial LAD, mild distal LM calcific stenosis, high grade OM1 stenosis Awaiting 2 vessel CABG on Monday Conitnue Aspirin, heparin,nitro, statin Continue metoprolol succinate 25 mg p.o. daily Encouraged to start using bedside incentive spirometer 10 times an hour while awake. Reviewed carotid duplex and upper extremity duplex that were performed yesterday results summarized above.  Bilateral carotid artery atherosclerosis: Continue aspirin and statin therapy.  Will monitor.  Hypertension: Continue antihypertensive medications.  Hyperlipidemia:  Continue high intensity statin. Most recent lipid profile reviewed. Patient does not endorse myalgias.  Constipation: Start Colace and milk of magnesia x1.  Plan of care discussed with the patient's nurse as well.  Rex Kras, [place], [facility]  Pager: [phone] Office: [phone]

## 2019-09-10 LAB — URINALYSIS, ROUTINE W REFLEX MICROSCOPIC
Bacteria, UA: NONE SEEN
Bilirubin Urine: NEGATIVE
Glucose, UA: NEGATIVE mg/dL
Ketones, ur: NEGATIVE mg/dL
Leukocytes,Ua: NEGATIVE
Nitrite: NEGATIVE
Protein, ur: NEGATIVE mg/dL
Specific Gravity, Urine: 1.019 (ref 1.005–1.030)
pH: 5 (ref 5.0–8.0)

## 2019-09-10 LAB — CBC
HCT: 39 % (ref 39.0–52.0)
Hemoglobin: 13.3 g/dL (ref 13.0–17.0)
MCH: 31.9 pg (ref 26.0–34.0)
MCHC: 34.1 g/dL (ref 30.0–36.0)
MCV: 93.5 fL (ref 80.0–100.0)
Platelets: 163 10*3/uL (ref 150–400)
RBC: 4.17 MIL/uL — ABNORMAL LOW (ref 4.22–5.81)
RDW: 12.7 % (ref 11.5–15.5)
WBC: 9 10*3/uL (ref 4.0–10.5)
nRBC: 0 % (ref 0.0–0.2)

## 2019-09-10 LAB — BLOOD GAS, ARTERIAL
Acid-base deficit: 1.9 mmol/L (ref 0.0–2.0)
Bicarbonate: 21.7 mmol/L (ref 20.0–28.0)
Drawn by: 28338
FIO2: 21
O2 Saturation: 91.7 %
Patient temperature: 37
pCO2 arterial: 32.8 mmHg (ref 32.0–48.0)
pH, Arterial: 7.436 (ref 7.350–7.450)
pO2, Arterial: 59 mmHg — ABNORMAL LOW (ref 83.0–108.0)

## 2019-09-10 LAB — BASIC METABOLIC PANEL
Anion gap: 7 (ref 5–15)
BUN: 20 mg/dL (ref 8–23)
CO2: 24 mmol/L (ref 22–32)
Calcium: 8.1 mg/dL — ABNORMAL LOW (ref 8.9–10.3)
Chloride: 106 mmol/L (ref 98–111)
Creatinine, Ser: 1.29 mg/dL — ABNORMAL HIGH (ref 0.61–1.24)
GFR calc Af Amer: >60 mL/min (ref >60)
GFR calc non Af Amer: 53 mL/min — ABNORMAL LOW (ref >60)
Glucose, Bld: 94 mg/dL (ref 70–99)
Potassium: 4.3 mmol/L (ref 3.5–5.1)
Sodium: 137 mmol/L (ref 135–145)

## 2019-09-10 LAB — HEPARIN LEVEL (UNFRACTIONATED): Heparin Unfractionated: 0.43 IU/mL (ref 0.30–0.70)

## 2019-09-10 LAB — PROTIME-INR
INR: 1.2 (ref 0.8–1.2)
Prothrombin Time: 14.3 seconds (ref 11.4–15.2)

## 2019-09-10 LAB — APTT: aPTT: 92 seconds — ABNORMAL HIGH (ref 24–36)

## 2019-09-10 LAB — SURGICAL PCR SCREEN
MRSA, PCR: NEGATIVE
Staphylococcus aureus: NEGATIVE

## 2019-09-10 LAB — ABO/RH: ABO/RH(D): A POS

## 2019-09-10 MED ORDER — TEMAZEPAM 15 MG PO CAPS: 15.0000 mg | ORAL_CAPSULE | Freq: Once | ORAL | Status: DC | PRN

## 2019-09-10 MED ORDER — BISACODYL 5 MG PO TBEC: 5.0000 mg | DELAYED_RELEASE_TABLET | Freq: Once | ORAL | Status: DC

## 2019-09-10 MED ORDER — CHLORHEXIDINE GLUCONATE CLOTH 2 % EX PADS
6.0000 | MEDICATED_PAD | Freq: Once | CUTANEOUS | Status: AC
  Administered 2019-09-11: 6 via TOPICAL

## 2019-09-10 MED ORDER — CHLORHEXIDINE GLUCONATE CLOTH 2 % EX PADS
6.0000 | MEDICATED_PAD | Freq: Once | CUTANEOUS | Status: AC
  Administered 2019-09-10: 6 via TOPICAL

## 2019-09-10 MED ORDER — METOPROLOL TARTRATE 12.5 MG HALF TABLET
12.5000 mg | ORAL_TABLET | Freq: Once | ORAL | Status: AC
  Administered 2019-09-11: 12.5 mg via ORAL
  Filled 2019-09-10: qty 1

## 2019-09-10 MED ORDER — CHLORHEXIDINE GLUCONATE 0.12 % MT SOLN
15.0000 mL | Freq: Once | OROMUCOSAL | Status: AC
  Administered 2019-09-11: 15 mL via OROMUCOSAL
  Filled 2019-09-10: qty 15

## 2019-09-10 MED ORDER — DIAZEPAM 5 MG PO TABS
5.0000 mg | ORAL_TABLET | Freq: Once | ORAL | Status: DC
  Filled 2019-09-10: qty 1

## 2019-09-10 NOTE — Progress Notes (Signed)
Subjective:  Patient seen and examined at bedside at approximately 1210pm. Patient is accompanied by his son at bedside. Patient states his chest pain is well controlled with nitro and heparin drip. He is awaiting surgery tomorrow, Monday.   Objective:  Vital Signs in the last 24 hours: Temp:  [98.6 F (37 C)-99.4 F (37.4 C)] 98.6 F (37 C) (08/22 0828) Pulse Rate:  [62-87] 74 (08/22 0943) Resp:  [17-18] 18 (08/22 0828) BP: (120-131)/(69-78) 126/71 (08/22 0942) SpO2:  [94 %-96 %] 96 % (08/22 0828) Weight:  [85.5 kg] 85.5 kg (08/22 0405)  Intake/Output from previous day: 08/21 0701 - 08/22 0700 In: 1201 [P.O.:780; I.V.:421] Out: 500 [Urine:500]  Physical Exam Vitals and nursing note reviewed.  Constitutional:      General: He is not in acute distress. Neck:     Vascular: No JVD.  Cardiovascular:     Rate and Rhythm: Normal rate and regular rhythm.     Heart sounds: Normal heart sounds. No murmur heard.   Pulmonary:     Effort: Pulmonary effort is normal.     Breath sounds: Normal breath sounds. No wheezing or rales.    Lab Results: BMP Recent Labs    09/07/19 0428 09/08/19 0903 09/10/19 0519  NA 139 138 137  K 4.1 3.7 4.3  CL 105 106 106  CO2 24 22 24   GLUCOSE 100* 171* 94  BUN 16 15 20   CREATININE 1.03 1.23 1.29*  CALCIUM 8.8* 8.4* 8.1*  GFRNONAA >60 56* 53*  GFRAA >60 >60 >60    CBC Recent Labs  Lab 09/10/19 0519  WBC 9.0  RBC 4.17*  HGB 13.3  HCT 39.0  PLT 163  MCV 93.5  MCH 31.9  MCHC 34.1  RDW 12.7    HEMOGLOBIN A1C Lab Results  Component Value Date   HGBA1C 5.7 (H) 09/07/2019   MPG 116.89 09/07/2019    Cardiac Panel (last 3 results) No results for input(s): CKTOTAL, CKMB, TROPONINI, RELINDX in the last 8760 hours.  BNP (last 3 results) No results for input(s): BNP in the last 8760 hours.  TSH No results for input(s): TSH in the last 8760 hours.  Lipid Panel     Component Value Date/Time   CHOL 171 09/07/2019 0428   TRIG  49 09/07/2019 0428   HDL 50 09/07/2019 0428   CHOLHDL 3.4 09/07/2019 0428   VLDL 10 09/07/2019 0428   LDLCALC 111 (H) 09/07/2019 0428   Results for HAVARD, RADIGAN (MRN [id number]) as of 09/08/2019 12:04  Ref. Range 09/06/2019 17:29 09/06/2019 20:40  Troponin I (High Sensitivity) Latest Ref Range: <18 ng/L 7,879 [facility]) 9,991 [facility])    Cardiac Studies:  EKG 09/06/2019: Sinus rhythm. Old anteroseptal infarct. Lateral nonspecific ST-T changes  Coronary angiography 09/07/2019: LM: Distal 40% stenosis w/severe calcification LAD: Ostial 90% stenosis with severe calcification LCx: No obvious lesion in LCx, but with TIMI II flow        OM1 long 90% stenosis RCA: Prox-mid 20% stenosis  Normal LVEDP Normal LVEF  Sub-optimal landing zone for LAD stent given severe calcification extending into distal LM. Recommend CVTS consult for CABG to LAD and OM1 Continue Aspirin/heparin. Patient has not received P2Y12i.  Echocardiogram 09/07/2019: 1. Left ventricular ejection fraction, by estimation, is 60 to 65%. The  left ventricle has normal function. The left ventricle has no regional  wall motion abnormalities. Left ventricular diastolic parameters were  normal.  2. Right ventricular systolic function is normal. The right ventricular  size is  normal.  3. No significant valvular abnormality.  4. Normal right atrial pressure.     US Doppler pre CABG (carotid and arms) 09/08/2019: Right Carotid: Velocities in the right ICA are consistent with a 1-39% stenosis.   Left Carotid: Velocities in the left ICA are consistent with a 1-39% stenosis.  Vertebrals: Bilateral vertebral arteries demonstrate antegrade flow.  Subclavians: Normal flow hemodynamics were seen in bilateral subclavian arteries.   Right Upper Extremity: Palmar arch not evaluated due to recent cardiac catheterization.   Left Upper Extremity: Doppler waveforms remain within normal limits with left radial compression. Doppler  waveform obliterate with left ulnar compression.    Pedal waveforms: bilateral pedal artery waveforms are within normal limits at rest.    Assessment & Recommendations:  79 y.o. Caucasian male  with hypertension, hyperlipidemia, NSTEMI  NSTEMI: Severe ostial LAD, mild distal LM calcific stenosis, high grade OM1 stenosis Awaiting 2 vessel CABG on Monday Conitnue Aspirin, heparin,nitro, statin Continue metoprolol succinate 25 mg p.o. daily Encouraged to start using bedside incentive spirometer 10 times an hour while awake. Telemetry reviewed.  Bilateral carotid artery atherosclerosis: Continue aspirin and statin therapy.  Will monitor.  Hypertension: Continue antihypertensive medications.  Hyperlipidemia: Continue high intensity statin. Most recent lipid profile reviewed. Patient does not endorse myalgias.  Constipation: Resolved. Continue medications for now.   Plan of care discussed with the patient and his son at bedside.   Rex Kras, [place], [facility]  Pager: [phone] Office: [phone]

## 2019-09-10 NOTE — Progress Notes (Signed)
3 Days Post-Op Procedure(s) (LRB): LEFT HEART CATH AND CORONARY ANGIOGRAPHY (N/A) Subjective: No chest pain or shortness of breath  Objective: Vital signs in last 24 hours: Temp:  [98.6 F (37 C)-99.4 F (37.4 C)] 98.6 F (37 C) (08/22 0828) Pulse Rate:  [62-87] 74 (08/22 0943) Cardiac Rhythm: Normal sinus rhythm (08/21 1900) Resp:  [17-18] 18 (08/22 0828) BP: (120-131)/(69-78) 126/71 (08/22 0942) SpO2:  [94 %-96 %] 96 % (08/22 0828) Weight:  [85.5 kg] 85.5 kg (08/22 0405)  Hemodynamic parameters for last 24 hours:    Intake/Output from previous day: 08/21 0701 - 08/22 0700 In: 1201 [P.O.:780; I.V.:421] Out: 500 [Urine:500] Intake/Output this shift: Total I/O In: -  Out: 200 [Urine:200]  General appearance: alert and cooperative Heart: regular rate and rhythm, S1, S2 normal, no murmur Lungs: clear to auscultation bilaterally  Lab Results: Recent Labs    09/09/19 0201 09/10/19 0519  WBC 12.4* 9.0  HGB 13.7 13.3  HCT 40.9 39.0  PLT 164 163   BMET:  Recent Labs    09/08/19 0903 09/10/19 0519  NA 138 137  K 3.7 4.3  CL 106 106  CO2 22 24  GLUCOSE 171* 94  BUN 15 20  CREATININE 1.23 1.29*  CALCIUM 8.4* 8.1*    PT/INR: No results for input(s): LABPROT, INR in the last 72 hours. ABG No results found for: PHART, HCO3, TCO2, ACIDBASEDEF, O2SAT CBG (last 3)  No results for input(s): GLUCAP in the last 72 hours.  Assessment/Plan:  Severe 2-vessel CAD. Plan CABG in am. Reviewed surgery with patient and son and answered their questions.    LOS: 4 days    Gaye Pollack 09/10/2019

## 2019-09-10 NOTE — Progress Notes (Signed)
[place] for heparin Indication: chest pain/ACS  Allergies  Allergen Reactions  . Tetracycline Other (See Comments)    Tightness of chest. Per patient was given in combination with another medication. Not sure of name.  . Streptomycin Other (See Comments)    Tightness in chest 1970  . Sulfonamide Derivatives Rash    Upper arms and stomach    Patient Measurements: Height: 5\' 11"  (180.3 cm) Weight: 85.5 kg (188 lb 9.6 oz) IBW/kg (Calculated) : 75.3 Heparin Dosing Weight: 88kg  Vital Signs: Temp: 98.6 F (37 C) (08/22 0828) Temp Source: Oral (08/22 0828) BP: 124/76 (08/22 0828) Pulse Rate: 62 (08/22 0828)  Labs: Recent Labs    09/08/19 0408 09/08/19 0408 09/08/19 0903 09/09/19 0201 09/10/19 0519  HGB 13.8   < >  --  13.7 13.3  HCT 41.3  --   --  40.9 39.0  PLT 173  --   --  164 163  HEPARINUNFRC 0.32   < > 0.51 0.34 0.43  CREATININE  --   --  1.23  --  1.29*   < > = values in this interval not displayed.    Estimated Creatinine Clearance: 51.1 mL/min (A) (by C-G formula based on SCr of 1.29 mg/dL (H)).   Assessment: 58 yom presented to the ED with CP and started on IV heparin, no anticoagulation PTA. Pt now s/p LHC with inability to intervene, pharmacy to continue IV heparin while CVTS evaluates for CABG.  Heparin level remains therapeutic on 1100 units/hr, CBC stable, CABG planned for Monday.  Goal of Therapy:  Heparin level 0.3-0.7 units/ml Monitor platelets by anticoagulation protocol: Yes   Plan:  Continue heparin 1100 units/hr Daily heparin level and CBC  Rebbeca Paul, PharmD PGY1 Pharmacy Resident 09/10/2019 8:30 AM  Please check [email or web address] for unit-specific pharmacy phone numbers.

## 2019-09-11 ENCOUNTER — Inpatient Hospital Stay (HOSPITAL_COMMUNITY): Payer: Medicare HMO | Admitting: Certified Registered Nurse Anesthetist

## 2019-09-11 ENCOUNTER — Inpatient Hospital Stay (HOSPITAL_COMMUNITY): Payer: Medicare HMO

## 2019-09-11 ENCOUNTER — Inpatient Hospital Stay (HOSPITAL_COMMUNITY)
Admission: EM | Disposition: A | Discharge status: Home / Self Care | Payer: Self-pay | Source: Home / Self Care | Attending: Surgery

## 2019-09-11 DIAGNOSIS — Z951 Presence of aortocoronary bypass graft: Secondary | ICD-10-CM

## 2019-09-11 HISTORY — PX: TEE WITHOUT CARDIOVERSION: SHX5443

## 2019-09-11 HISTORY — PX: CORONARY ARTERY BYPASS GRAFT: SHX141

## 2019-09-11 LAB — CBC
HCT: 42.8 % (ref 39.0–52.0)
HCT: 34 % — ABNORMAL LOW (ref 39.0–52.0)
HCT: 37.1 % — ABNORMAL LOW (ref 39.0–52.0)
Hemoglobin: 14.2 g/dL (ref 13.0–17.0)
Hemoglobin: 11.1 g/dL — ABNORMAL LOW (ref 13.0–17.0)
Hemoglobin: 12.4 g/dL — ABNORMAL LOW (ref 13.0–17.0)
MCH: 31.2 pg (ref 26.0–34.0)
MCH: 31.1 pg (ref 26.0–34.0)
MCH: 32.1 pg (ref 26.0–34.0)
MCHC: 33.2 g/dL (ref 30.0–36.0)
MCHC: 32.6 g/dL (ref 30.0–36.0)
MCHC: 33.4 g/dL (ref 30.0–36.0)
MCV: 94.1 fL (ref 80.0–100.0)
MCV: 95.2 fL (ref 80.0–100.0)
MCV: 96.1 fL (ref 80.0–100.0)
Platelets: 213 10*3/uL (ref 150–400)
Platelets: 128 10*3/uL — ABNORMAL LOW (ref 150–400)
Platelets: 148 10*3/uL — ABNORMAL LOW (ref 150–400)
RBC: 4.55 MIL/uL (ref 4.22–5.81)
RBC: 3.57 MIL/uL — ABNORMAL LOW (ref 4.22–5.81)
RBC: 3.86 MIL/uL — ABNORMAL LOW (ref 4.22–5.81)
RDW: 12.8 % (ref 11.5–15.5)
RDW: 12.8 % (ref 11.5–15.5)
RDW: 12.7 % (ref 11.5–15.5)
WBC: 9.5 10*3/uL (ref 4.0–10.5)
WBC: 10.3 10*3/uL (ref 4.0–10.5)
WBC: 8.7 10*3/uL (ref 4.0–10.5)
nRBC: 0 % (ref 0.0–0.2)
nRBC: 0 % (ref 0.0–0.2)
nRBC: 0 % (ref 0.0–0.2)

## 2019-09-11 LAB — POCT I-STAT 7, (LYTES, BLD GAS, ICA,H+H)
Acid-base deficit: 1 mmol/L (ref 0.0–2.0)
Acid-base deficit: 1 mmol/L (ref 0.0–2.0)
Acid-base deficit: 4 mmol/L — ABNORMAL HIGH (ref 0.0–2.0)
Acid-base deficit: 4 mmol/L — ABNORMAL HIGH (ref 0.0–2.0)
Acid-base deficit: 5 mmol/L — ABNORMAL HIGH (ref 0.0–2.0)
Acid-base deficit: 7 mmol/L — ABNORMAL HIGH (ref 0.0–2.0)
Bicarbonate: 24.1 mmol/L (ref 20.0–28.0)
Bicarbonate: 23.5 mmol/L (ref 20.0–28.0)
Bicarbonate: 21.3 mmol/L (ref 20.0–28.0)
Bicarbonate: 21.8 mmol/L (ref 20.0–28.0)
Bicarbonate: 20.7 mmol/L (ref 20.0–28.0)
Bicarbonate: 19.6 mmol/L — ABNORMAL LOW (ref 20.0–28.0)
Calcium, Ion: 1 mmol/L — ABNORMAL LOW (ref 1.15–1.40)
Calcium, Ion: 1.04 mmol/L — ABNORMAL LOW (ref 1.15–1.40)
Calcium, Ion: 1.1 mmol/L — ABNORMAL LOW (ref 1.15–1.40)
Calcium, Ion: 1.08 mmol/L — ABNORMAL LOW (ref 1.15–1.40)
Calcium, Ion: 1.11 mmol/L — ABNORMAL LOW (ref 1.15–1.40)
Calcium, Ion: 1.06 mmol/L — ABNORMAL LOW (ref 1.15–1.40)
HCT: 28 % — ABNORMAL LOW (ref 39.0–52.0)
HCT: 30 % — ABNORMAL LOW (ref 39.0–52.0)
HCT: 32 % — ABNORMAL LOW (ref 39.0–52.0)
HCT: 32 % — ABNORMAL LOW (ref 39.0–52.0)
HCT: 34 % — ABNORMAL LOW (ref 39.0–52.0)
HCT: 31 % — ABNORMAL LOW (ref 39.0–52.0)
Hemoglobin: 9.5 g/dL — ABNORMAL LOW (ref 13.0–17.0)
Hemoglobin: 10.2 g/dL — ABNORMAL LOW (ref 13.0–17.0)
Hemoglobin: 10.9 g/dL — ABNORMAL LOW (ref 13.0–17.0)
Hemoglobin: 10.9 g/dL — ABNORMAL LOW (ref 13.0–17.0)
Hemoglobin: 11.6 g/dL — ABNORMAL LOW (ref 13.0–17.0)
Hemoglobin: 10.5 g/dL — ABNORMAL LOW (ref 13.0–17.0)
O2 Saturation: 100 %
O2 Saturation: 100 %
O2 Saturation: 97 %
O2 Saturation: 98 %
O2 Saturation: 98 %
O2 Saturation: 98 %
Patient temperature: 35.9
Patient temperature: 36.3
Patient temperature: 36.9
Patient temperature: 37.2
Potassium: 4.8 mmol/L (ref 3.5–5.1)
Potassium: 5.6 mmol/L — ABNORMAL HIGH (ref 3.5–5.1)
Potassium: 4.3 mmol/L (ref 3.5–5.1)
Potassium: 4.5 mmol/L (ref 3.5–5.1)
Potassium: 4.7 mmol/L (ref 3.5–5.1)
Potassium: 4.1 mmol/L (ref 3.5–5.1)
Sodium: 141 mmol/L (ref 135–145)
Sodium: 140 mmol/L (ref 135–145)
Sodium: 140 mmol/L (ref 135–145)
Sodium: 141 mmol/L (ref 135–145)
Sodium: 140 mmol/L (ref 135–145)
Sodium: 140 mmol/L (ref 135–145)
TCO2: 25 mmol/L (ref 22–32)
TCO2: 25 mmol/L (ref 22–32)
TCO2: 22 mmol/L (ref 22–32)
TCO2: 23 mmol/L (ref 22–32)
TCO2: 22 mmol/L (ref 22–32)
TCO2: 21 mmol/L — ABNORMAL LOW (ref 22–32)
pCO2 arterial: 39.2 mmHg (ref 32.0–48.0)
pCO2 arterial: 38.1 mmHg (ref 32.0–48.0)
pCO2 arterial: 35.5 mmHg (ref 32.0–48.0)
pCO2 arterial: 40.3 mmHg (ref 32.0–48.0)
pCO2 arterial: 40.1 mmHg (ref 32.0–48.0)
pCO2 arterial: 40.9 mmHg (ref 32.0–48.0)
pH, Arterial: 7.397 (ref 7.350–7.450)
pH, Arterial: 7.398 (ref 7.350–7.450)
pH, Arterial: 7.381 (ref 7.350–7.450)
pH, Arterial: 7.339 — ABNORMAL LOW (ref 7.350–7.450)
pH, Arterial: 7.319 — ABNORMAL LOW (ref 7.350–7.450)
pH, Arterial: 7.288 — ABNORMAL LOW (ref 7.350–7.450)
pO2, Arterial: 300 mmHg — ABNORMAL HIGH (ref 83.0–108.0)
pO2, Arterial: 315 mmHg — ABNORMAL HIGH (ref 83.0–108.0)
pO2, Arterial: 87 mmHg (ref 83.0–108.0)
pO2, Arterial: 118 mmHg — ABNORMAL HIGH (ref 83.0–108.0)
pO2, Arterial: 106 mmHg (ref 83.0–108.0)
pO2, Arterial: 124 mmHg — ABNORMAL HIGH (ref 83.0–108.0)

## 2019-09-11 LAB — COMPREHENSIVE METABOLIC PANEL
ALT: 25 U/L (ref 0–44)
AST: 37 U/L (ref 15–41)
Albumin: 3.2 g/dL — ABNORMAL LOW (ref 3.5–5.0)
Alkaline Phosphatase: 60 U/L (ref 38–126)
Anion gap: 11 (ref 5–15)
BUN: 17 mg/dL (ref 8–23)
CO2: 21 mmol/L — ABNORMAL LOW (ref 22–32)
Calcium: 8.4 mg/dL — ABNORMAL LOW (ref 8.9–10.3)
Chloride: 106 mmol/L (ref 98–111)
Creatinine, Ser: 1.22 mg/dL (ref 0.61–1.24)
GFR calc Af Amer: >60 mL/min (ref >60)
GFR calc non Af Amer: 57 mL/min — ABNORMAL LOW (ref >60)
Glucose, Bld: 106 mg/dL — ABNORMAL HIGH (ref 70–99)
Potassium: 4.2 mmol/L (ref 3.5–5.1)
Sodium: 138 mmol/L (ref 135–145)
Total Bilirubin: 0.8 mg/dL (ref 0.3–1.2)
Total Protein: 7 g/dL (ref 6.5–8.1)

## 2019-09-11 LAB — POCT I-STAT, CHEM 8
BUN: 17 mg/dL (ref 8–23)
BUN: 16 mg/dL (ref 8–23)
BUN: 16 mg/dL (ref 8–23)
BUN: 16 mg/dL (ref 8–23)
Calcium, Ion: 1.09 mmol/L — ABNORMAL LOW (ref 1.15–1.40)
Calcium, Ion: 1.16 mmol/L (ref 1.15–1.40)
Calcium, Ion: 1.06 mmol/L — ABNORMAL LOW (ref 1.15–1.40)
Calcium, Ion: 1.06 mmol/L — ABNORMAL LOW (ref 1.15–1.40)
Chloride: 105 mmol/L (ref 98–111)
Chloride: 106 mmol/L (ref 98–111)
Chloride: 105 mmol/L (ref 98–111)
Chloride: 106 mmol/L (ref 98–111)
Creatinine, Ser: 0.9 mg/dL (ref 0.61–1.24)
Creatinine, Ser: 0.8 mg/dL (ref 0.61–1.24)
Creatinine, Ser: 0.8 mg/dL (ref 0.61–1.24)
Creatinine, Ser: 0.9 mg/dL (ref 0.61–1.24)
Glucose, Bld: 102 mg/dL — ABNORMAL HIGH (ref 70–99)
Glucose, Bld: 104 mg/dL — ABNORMAL HIGH (ref 70–99)
Glucose, Bld: 112 mg/dL — ABNORMAL HIGH (ref 70–99)
Glucose, Bld: 113 mg/dL — ABNORMAL HIGH (ref 70–99)
HCT: 39 % (ref 39.0–52.0)
HCT: 36 % — ABNORMAL LOW (ref 39.0–52.0)
HCT: 30 % — ABNORMAL LOW (ref 39.0–52.0)
HCT: 33 % — ABNORMAL LOW (ref 39.0–52.0)
Hemoglobin: 13.3 g/dL (ref 13.0–17.0)
Hemoglobin: 12.2 g/dL — ABNORMAL LOW (ref 13.0–17.0)
Hemoglobin: 10.2 g/dL — ABNORMAL LOW (ref 13.0–17.0)
Hemoglobin: 11.2 g/dL — ABNORMAL LOW (ref 13.0–17.0)
Potassium: 4 mmol/L (ref 3.5–5.1)
Potassium: 4.3 mmol/L (ref 3.5–5.1)
Potassium: 5.1 mmol/L (ref 3.5–5.1)
Potassium: 4.8 mmol/L (ref 3.5–5.1)
Sodium: 140 mmol/L (ref 135–145)
Sodium: 140 mmol/L (ref 135–145)
Sodium: 139 mmol/L (ref 135–145)
Sodium: 140 mmol/L (ref 135–145)
TCO2: 21 mmol/L — ABNORMAL LOW (ref 22–32)
TCO2: 21 mmol/L — ABNORMAL LOW (ref 22–32)
TCO2: 23 mmol/L (ref 22–32)
TCO2: 21 mmol/L — ABNORMAL LOW (ref 22–32)

## 2019-09-11 LAB — PROTIME-INR
INR: 1.4 — ABNORMAL HIGH (ref 0.8–1.2)
Prothrombin Time: 16.3 seconds — ABNORMAL HIGH (ref 11.4–15.2)

## 2019-09-11 LAB — PLATELET COUNT: Platelets: 149 10*3/uL — ABNORMAL LOW (ref 150–400)

## 2019-09-11 LAB — BASIC METABOLIC PANEL
Anion gap: 11 (ref 5–15)
BUN: 15 mg/dL (ref 8–23)
CO2: 19 mmol/L — ABNORMAL LOW (ref 22–32)
Calcium: 7.4 mg/dL — ABNORMAL LOW (ref 8.9–10.3)
Chloride: 108 mmol/L (ref 98–111)
Creatinine, Ser: 0.96 mg/dL (ref 0.61–1.24)
GFR calc Af Amer: >60 mL/min (ref >60)
GFR calc non Af Amer: >60 mL/min (ref >60)
Glucose, Bld: 108 mg/dL — ABNORMAL HIGH (ref 70–99)
Potassium: 4.7 mmol/L (ref 3.5–5.1)
Sodium: 138 mmol/L (ref 135–145)

## 2019-09-11 LAB — GLUCOSE, CAPILLARY
Glucose-Capillary: 112 mg/dL — ABNORMAL HIGH (ref 70–99)
Glucose-Capillary: 114 mg/dL — ABNORMAL HIGH (ref 70–99)
Glucose-Capillary: 111 mg/dL — ABNORMAL HIGH (ref 70–99)
Glucose-Capillary: 115 mg/dL — ABNORMAL HIGH (ref 70–99)
Glucose-Capillary: 102 mg/dL — ABNORMAL HIGH (ref 70–99)
Glucose-Capillary: 101 mg/dL — ABNORMAL HIGH (ref 70–99)
Glucose-Capillary: 96 mg/dL (ref 70–99)
Glucose-Capillary: 128 mg/dL — ABNORMAL HIGH (ref 70–99)

## 2019-09-11 LAB — MAGNESIUM
Magnesium: 2.3 mg/dL (ref 1.7–2.4)
Magnesium: 3.1 mg/dL — ABNORMAL HIGH (ref 1.7–2.4)

## 2019-09-11 LAB — HEMOGLOBIN AND HEMATOCRIT, BLOOD
HCT: 30 % — ABNORMAL LOW (ref 39.0–52.0)
Hemoglobin: 9.9 g/dL — ABNORMAL LOW (ref 13.0–17.0)

## 2019-09-11 LAB — PREPARE RBC (CROSSMATCH)

## 2019-09-11 LAB — APTT: aPTT: 42 seconds — ABNORMAL HIGH (ref 24–36)

## 2019-09-11 LAB — HEPARIN LEVEL (UNFRACTIONATED): Heparin Unfractionated: 0.47 IU/mL (ref 0.30–0.70)

## 2019-09-11 SURGERY — CORONARY ARTERY BYPASS GRAFTING (CABG)
Anesthesia: General | Site: Esophagus

## 2019-09-11 MED ORDER — SODIUM CHLORIDE 0.9% FLUSH: 10.0000 mL | INTRAVENOUS | Status: DC | PRN

## 2019-09-11 MED ORDER — DEXAMETHASONE SODIUM PHOSPHATE 10 MG/ML IJ SOLN
INTRAMUSCULAR | Status: AC
  Filled 2019-09-11: qty 1

## 2019-09-11 MED ORDER — LIDOCAINE 2% (20 MG/ML) 5 ML SYRINGE
INTRAMUSCULAR | Status: AC
  Filled 2019-09-11: qty 5

## 2019-09-11 MED ORDER — FAMOTIDINE IN NACL 20-0.9 MG/50ML-% IV SOLN
20.0000 mg | Freq: Two times a day (BID) | INTRAVENOUS | Status: AC
  Administered 2019-09-11 (×2): 20 mg via INTRAVENOUS
  Filled 2019-09-11 (×2): qty 50

## 2019-09-11 MED ORDER — ASPIRIN EC 325 MG PO TBEC
325.0000 mg | DELAYED_RELEASE_TABLET | Freq: Every day | ORAL | Status: DC
  Administered 2019-09-12 – 2019-09-13 (×2): 325 mg via ORAL
  Filled 2019-09-11 (×2): qty 1

## 2019-09-11 MED ORDER — ASPIRIN 81 MG PO CHEW: 324.0000 mg | CHEWABLE_TABLET | Freq: Every day | ORAL | Status: DC

## 2019-09-11 MED ORDER — ACETAMINOPHEN 160 MG/5ML PO SOLN: 1000.0000 mg | Freq: Four times a day (QID) | ORAL | Status: DC

## 2019-09-11 MED ORDER — PANTOPRAZOLE SODIUM 40 MG PO TBEC
40.0000 mg | DELAYED_RELEASE_TABLET | Freq: Every day | ORAL | Status: DC
  Administered 2019-09-13: 40 mg via ORAL
  Filled 2019-09-11: qty 1

## 2019-09-11 MED ORDER — NITROGLYCERIN IN D5W 200-5 MCG/ML-% IV SOLN: 0.0000 ug/min | INTRAVENOUS | Status: DC

## 2019-09-11 MED ORDER — PROPOFOL 10 MG/ML IV BOLUS
INTRAVENOUS | Status: AC
  Filled 2019-09-11: qty 20

## 2019-09-11 MED ORDER — ONDANSETRON HCL 4 MG/2ML IJ SOLN
INTRAMUSCULAR | Status: AC
  Filled 2019-09-11: qty 2

## 2019-09-11 MED ORDER — ACETAMINOPHEN 500 MG PO TABS
1000.0000 mg | ORAL_TABLET | Freq: Four times a day (QID) | ORAL | Status: DC
  Administered 2019-09-12 – 2019-09-13 (×6): 1000 mg via ORAL
  Filled 2019-09-11 (×6): qty 2

## 2019-09-11 MED ORDER — LACTATED RINGERS IV SOLN: INTRAVENOUS | Status: DC | PRN

## 2019-09-11 MED ORDER — ACETAMINOPHEN 650 MG RE SUPP
650.0000 mg | Freq: Once | RECTAL | Status: AC
  Administered 2019-09-11: 650 mg via RECTAL

## 2019-09-11 MED ORDER — FENTANYL CITRATE (PF) 250 MCG/5ML IJ SOLN
INTRAMUSCULAR | Status: AC
  Filled 2019-09-11: qty 5

## 2019-09-11 MED ORDER — SODIUM CHLORIDE (PF) 0.9 % IJ SOLN
INTRAMUSCULAR | Status: AC
  Filled 2019-09-11: qty 10

## 2019-09-11 MED ORDER — HEMOSTATIC AGENTS (NO CHARGE) OPTIME
TOPICAL | Status: DC | PRN
  Administered 2019-09-11: 2 via TOPICAL
  Administered 2019-09-11: 1 via TOPICAL

## 2019-09-11 MED ORDER — PHENYLEPHRINE HCL-NACL 20-0.9 MG/250ML-% IV SOLN: 0.0000 ug/min | INTRAVENOUS | Status: DC

## 2019-09-11 MED ORDER — MORPHINE SULFATE (PF) 2 MG/ML IV SOLN: 1.0000 mg | INTRAVENOUS | Status: DC | PRN

## 2019-09-11 MED ORDER — LIDOCAINE HCL (CARDIAC) PF 100 MG/5ML IV SOSY
PREFILLED_SYRINGE | INTRAVENOUS | Status: DC | PRN
  Administered 2019-09-11: 60 mg via INTRAVENOUS

## 2019-09-11 MED ORDER — THROMBIN (RECOMBINANT) 20000 UNITS EX SOLR
CUTANEOUS | Status: AC
  Filled 2019-09-11: qty 20000

## 2019-09-11 MED ORDER — SODIUM CHLORIDE 0.9 % IV SOLN: 250.0000 mL | INTRAVENOUS | Status: DC

## 2019-09-11 MED ORDER — OXYCODONE HCL 5 MG PO TABS
5.0000 mg | ORAL_TABLET | ORAL | Status: DC | PRN
  Administered 2019-09-11 – 2019-09-12 (×2): 10 mg via ORAL
  Filled 2019-09-11 (×2): qty 2

## 2019-09-11 MED ORDER — PROTAMINE SULFATE 10 MG/ML IV SOLN
INTRAVENOUS | Status: DC | PRN
  Administered 2019-09-11: 10 mg via INTRAVENOUS

## 2019-09-11 MED ORDER — SODIUM CHLORIDE 0.9% FLUSH
10.0000 mL | Freq: Two times a day (BID) | INTRAVENOUS | Status: DC
  Administered 2019-09-11 – 2019-09-13 (×4): 10 mL

## 2019-09-11 MED ORDER — DOCUSATE SODIUM 100 MG PO CAPS
200.0000 mg | ORAL_CAPSULE | Freq: Every day | ORAL | Status: DC
  Administered 2019-09-12 – 2019-09-13 (×2): 200 mg via ORAL
  Filled 2019-09-11 (×2): qty 2

## 2019-09-11 MED ORDER — HEMOSTATIC AGENTS (NO CHARGE) OPTIME
TOPICAL | Status: DC | PRN
  Administered 2019-09-11: 1 via TOPICAL

## 2019-09-11 MED ORDER — LACTATED RINGERS IV SOLN: 500.0000 mL | Freq: Once | INTRAVENOUS | Status: DC | PRN

## 2019-09-11 MED ORDER — ALBUMIN HUMAN 5 % IV SOLN: INTRAVENOUS | Status: DC | PRN

## 2019-09-11 MED ORDER — ALBUMIN HUMAN 5 % IV SOLN
250.0000 mL | INTRAVENOUS | Status: DC | PRN
  Administered 2019-09-11 (×3): 12.5 g via INTRAVENOUS
  Filled 2019-09-11: qty 250

## 2019-09-11 MED ORDER — SODIUM CHLORIDE 0.9% FLUSH: 3.0000 mL | INTRAVENOUS | Status: DC | PRN

## 2019-09-11 MED ORDER — HEPARIN SODIUM (PORCINE) 1000 UNIT/ML IJ SOLN
INTRAMUSCULAR | Status: DC | PRN
  Administered 2019-09-11: 26000 [IU] via INTRAVENOUS

## 2019-09-11 MED ORDER — ARTIFICIAL TEARS OPHTHALMIC OINT
TOPICAL_OINTMENT | OPHTHALMIC | Status: DC | PRN
  Administered 2019-09-11: 1 via OPHTHALMIC

## 2019-09-11 MED ORDER — SODIUM BICARBONATE 8.4 % IV SOLN
50.0000 meq | Freq: Once | INTRAVENOUS | Status: AC
  Administered 2019-09-11: 50 meq via INTRAVENOUS

## 2019-09-11 MED ORDER — DEXMEDETOMIDINE HCL IN NACL 400 MCG/100ML IV SOLN: 0.0000 ug/kg/h | INTRAVENOUS | Status: DC

## 2019-09-11 MED ORDER — PROPOFOL 10 MG/ML IV BOLUS
INTRAVENOUS | Status: DC | PRN
  Administered 2019-09-11: 70 mg via INTRAVENOUS

## 2019-09-11 MED ORDER — STERILE WATER FOR IRRIGATION IR SOLN
Status: DC | PRN
  Administered 2019-09-11: 1000 mL

## 2019-09-11 MED ORDER — ROCURONIUM BROMIDE 10 MG/ML (PF) SYRINGE
PREFILLED_SYRINGE | INTRAVENOUS | Status: AC
  Filled 2019-09-11: qty 10

## 2019-09-11 MED ORDER — SODIUM CHLORIDE 0.9 % IV SOLN: INTRAVENOUS | Status: DC

## 2019-09-11 MED ORDER — ONDANSETRON HCL 4 MG/2ML IJ SOLN
4.0000 mg | Freq: Four times a day (QID) | INTRAMUSCULAR | Status: DC | PRN
  Administered 2019-09-13: 4 mg via INTRAVENOUS
  Filled 2019-09-11: qty 2

## 2019-09-11 MED ORDER — FENTANYL CITRATE (PF) 250 MCG/5ML IJ SOLN
INTRAMUSCULAR | Status: DC | PRN
Start: 2019-09-11 — End: 2019-09-11
  Administered 2019-09-11: 150 ug via INTRAVENOUS
  Administered 2019-09-11: 100 ug via INTRAVENOUS
  Administered 2019-09-11: 50 ug via INTRAVENOUS
  Administered 2019-09-11 (×3): 100 ug via INTRAVENOUS
  Administered 2019-09-11 (×2): 50 ug via INTRAVENOUS
  Administered 2019-09-11: 100 ug via INTRAVENOUS
  Administered 2019-09-11: 200 ug via INTRAVENOUS
  Administered 2019-09-11: 50 ug via INTRAVENOUS

## 2019-09-11 MED ORDER — METOPROLOL TARTRATE 25 MG/10 ML ORAL SUSPENSION
12.5000 mg | Freq: Two times a day (BID) | ORAL | Status: DC
  Filled 2019-09-11: qty 5

## 2019-09-11 MED ORDER — LACTATED RINGERS IV SOLN: INTRAVENOUS | Status: DC

## 2019-09-11 MED ORDER — PLASMA-LYTE 148 IV SOLN
INTRAVENOUS | Status: DC | PRN
  Administered 2019-09-11: 500 mL

## 2019-09-11 MED ORDER — SODIUM CHLORIDE 0.9 % IV SOLN: INTRAVENOUS | Status: DC | PRN

## 2019-09-11 MED ORDER — CHLORHEXIDINE GLUCONATE 0.12 % MT SOLN
15.0000 mL | OROMUCOSAL | Status: AC
  Administered 2019-09-11: 15 mL via OROMUCOSAL

## 2019-09-11 MED ORDER — TRAMADOL HCL 50 MG PO TABS: 50.0000 mg | ORAL_TABLET | ORAL | Status: DC | PRN

## 2019-09-11 MED ORDER — SODIUM CHLORIDE 0.9 % IV SOLN
1.5000 g | Freq: Two times a day (BID) | INTRAVENOUS | Status: AC
  Administered 2019-09-11 – 2019-09-13 (×4): 1.5 g via INTRAVENOUS
  Filled 2019-09-11 (×5): qty 1.5

## 2019-09-11 MED ORDER — DEXTROSE 50 % IV SOLN: 0.0000 mL | INTRAVENOUS | Status: DC | PRN

## 2019-09-11 MED ORDER — INSULIN ASPART 100 UNIT/ML ~~LOC~~ SOLN: 0.0000 [IU] | SUBCUTANEOUS | Status: DC

## 2019-09-11 MED ORDER — MIDAZOLAM HCL 2 MG/2ML IJ SOLN: 2.0000 mg | INTRAMUSCULAR | Status: DC | PRN

## 2019-09-11 MED ORDER — METOPROLOL TARTRATE 12.5 MG HALF TABLET
12.5000 mg | ORAL_TABLET | Freq: Two times a day (BID) | ORAL | Status: DC
  Administered 2019-09-12 – 2019-09-13 (×3): 12.5 mg via ORAL
  Filled 2019-09-11 (×3): qty 1

## 2019-09-11 MED ORDER — CHLORHEXIDINE GLUCONATE 0.12% ORAL RINSE (MEDLINE KIT)
15.0000 mL | Freq: Two times a day (BID) | OROMUCOSAL | Status: DC
  Administered 2019-09-11 – 2019-09-12 (×2): 15 mL via OROMUCOSAL

## 2019-09-11 MED ORDER — SODIUM CHLORIDE 0.9% IV SOLUTION: Freq: Once | INTRAVENOUS | Status: DC

## 2019-09-11 MED ORDER — PHENYLEPHRINE 40 MCG/ML (10ML) SYRINGE FOR IV PUSH (FOR BLOOD PRESSURE SUPPORT)
PREFILLED_SYRINGE | INTRAVENOUS | Status: DC | PRN
  Administered 2019-09-11: 80 ug via INTRAVENOUS
  Administered 2019-09-11 (×2): 40 ug via INTRAVENOUS

## 2019-09-11 MED ORDER — CHLORHEXIDINE GLUCONATE CLOTH 2 % EX PADS
6.0000 | MEDICATED_PAD | Freq: Every day | CUTANEOUS | Status: DC
  Administered 2019-09-11 – 2019-09-13 (×2): 6 via TOPICAL

## 2019-09-11 MED ORDER — MIDAZOLAM HCL 5 MG/5ML IJ SOLN
INTRAMUSCULAR | Status: DC | PRN
  Administered 2019-09-11: 2 mg via INTRAVENOUS
  Administered 2019-09-11 (×2): 1 mg via INTRAVENOUS
  Administered 2019-09-11 (×3): 2 mg via INTRAVENOUS

## 2019-09-11 MED ORDER — ACETAMINOPHEN 160 MG/5ML PO SOLN: 650.0000 mg | Freq: Once | ORAL | Status: AC

## 2019-09-11 MED ORDER — FENTANYL CITRATE (PF) 250 MCG/5ML IJ SOLN
INTRAMUSCULAR | Status: AC
  Filled 2019-09-11: qty 20

## 2019-09-11 MED ORDER — METOPROLOL TARTRATE 5 MG/5ML IV SOLN: 2.5000 mg | INTRAVENOUS | Status: DC | PRN

## 2019-09-11 MED ORDER — BISACODYL 10 MG RE SUPP: 10.0000 mg | Freq: Every day | RECTAL | Status: DC

## 2019-09-11 MED ORDER — PROTAMINE SULFATE 10 MG/ML IV SOLN
INTRAVENOUS | Status: AC
  Filled 2019-09-11: qty 25

## 2019-09-11 MED ORDER — ROCURONIUM BROMIDE 100 MG/10ML IV SOLN
INTRAVENOUS | Status: DC | PRN
  Administered 2019-09-11: 50 mg via INTRAVENOUS
  Administered 2019-09-11: 30 mg via INTRAVENOUS
  Administered 2019-09-11: 5 mg via INTRAVENOUS
  Administered 2019-09-11: 100 mg via INTRAVENOUS

## 2019-09-11 MED ORDER — MIDAZOLAM HCL (PF) 10 MG/2ML IJ SOLN
INTRAMUSCULAR | Status: AC
  Filled 2019-09-11: qty 2

## 2019-09-11 MED ORDER — VANCOMYCIN HCL IN DEXTROSE 1-5 GM/200ML-% IV SOLN
1000.0000 mg | Freq: Once | INTRAVENOUS | Status: AC
  Administered 2019-09-11: 1000 mg via INTRAVENOUS
  Filled 2019-09-11: qty 200

## 2019-09-11 MED ORDER — POTASSIUM CHLORIDE 10 MEQ/50ML IV SOLN: 10.0000 meq | INTRAVENOUS | Status: AC

## 2019-09-11 MED ORDER — MAGNESIUM SULFATE 4 GM/100ML IV SOLN
4.0000 g | Freq: Once | INTRAVENOUS | Status: AC
  Administered 2019-09-11: 4 g via INTRAVENOUS
  Filled 2019-09-11: qty 100

## 2019-09-11 MED ORDER — BISACODYL 5 MG PO TBEC
10.0000 mg | DELAYED_RELEASE_TABLET | Freq: Every day | ORAL | Status: DC
  Administered 2019-09-12 – 2019-09-13 (×2): 10 mg via ORAL
  Filled 2019-09-11 (×2): qty 2

## 2019-09-11 MED ORDER — SODIUM CHLORIDE 0.45 % IV SOLN: INTRAVENOUS | Status: DC | PRN

## 2019-09-11 MED ORDER — 0.9 % SODIUM CHLORIDE (POUR BTL) OPTIME
TOPICAL | Status: DC | PRN
  Administered 2019-09-11: 5000 mL

## 2019-09-11 MED ORDER — SODIUM CHLORIDE 0.9% FLUSH
3.0000 mL | Freq: Two times a day (BID) | INTRAVENOUS | Status: DC
  Administered 2019-09-12 – 2019-09-13 (×3): 3 mL via INTRAVENOUS

## 2019-09-11 MED ORDER — ORAL CARE MOUTH RINSE
15.0000 mL | OROMUCOSAL | Status: DC
  Administered 2019-09-11 (×3): 15 mL via OROMUCOSAL

## 2019-09-11 MED ORDER — INSULIN REGULAR(HUMAN) IN NACL 100-0.9 UT/100ML-% IV SOLN
INTRAVENOUS | Status: DC
  Administered 2019-09-11: 0.6 [IU]/h via INTRAVENOUS

## 2019-09-11 MED ORDER — CHLORHEXIDINE GLUCONATE CLOTH 2 % EX PADS: 6.0000 | MEDICATED_PAD | Freq: Every day | CUTANEOUS | Status: DC

## 2019-09-11 MED ORDER — THROMBIN 20000 UNITS EX SOLR
CUTANEOUS | Status: DC | PRN
  Administered 2019-09-11: 20000 [IU] via TOPICAL

## 2019-09-11 SURGICAL SUPPLY — 108 items
BAG DECANTER FOR FLEXI CONT (MISCELLANEOUS) ×3 IMPLANT
BLADE CLIPPER SURG (BLADE) ×3 IMPLANT
BLADE STERNUM SYSTEM 6 (BLADE) ×3 IMPLANT
BNDG ELASTIC 4X5.8 VLCR STR LF (GAUZE/BANDAGES/DRESSINGS) ×3 IMPLANT
BNDG ELASTIC 6X10 VLCR STRL LF (GAUZE/BANDAGES/DRESSINGS) ×3 IMPLANT
BNDG ELASTIC 6X5.8 VLCR STR LF (GAUZE/BANDAGES/DRESSINGS) ×3 IMPLANT
BNDG GAUZE ELAST 4 BULKY (GAUZE/BANDAGES/DRESSINGS) ×3 IMPLANT
CANISTER SUCT 3000ML PPV (MISCELLANEOUS) ×3 IMPLANT
CATH ROBINSON RED A/P 18FR (CATHETERS) ×6 IMPLANT
CATH THORACIC 28FR (CATHETERS) ×3 IMPLANT
CATH THORACIC 36FR (CATHETERS) ×3 IMPLANT
CATH THORACIC 36FR RT ANG (CATHETERS) ×3 IMPLANT
CLIP RETRACTION 3.0MM CORONARY (MISCELLANEOUS) ×3 IMPLANT
CLIP VESOCCLUDE MED 24/CT (CLIP) IMPLANT
CLIP VESOCCLUDE SM WIDE 24/CT (CLIP) ×3 IMPLANT
DERMABOND ADVANCED (GAUZE/BANDAGES/DRESSINGS) ×1
DERMABOND ADVANCED .7 DNX12 (GAUZE/BANDAGES/DRESSINGS) ×2 IMPLANT
DRAPE CARDIOVASCULAR INCISE (DRAPES) ×1
DRAPE SLUSH/WARMER DISC (DRAPES) ×3 IMPLANT
DRAPE SRG 135X102X78XABS (DRAPES) ×2 IMPLANT
DRSG AQUACEL AG ADV 3.5X14 (GAUZE/BANDAGES/DRESSINGS) ×3 IMPLANT
DRSG COVADERM 4X14 (GAUZE/BANDAGES/DRESSINGS) ×3 IMPLANT
ELECT BLADE 4.0 EZ CLEAN MEGAD (MISCELLANEOUS) ×6
ELECT CAUTERY BLADE 6.4 (BLADE) ×3 IMPLANT
ELECT REM PT RETURN 9FT ADLT (ELECTROSURGICAL) ×6
ELECTRODE BLDE 4.0 EZ CLN MEGD (MISCELLANEOUS) ×4 IMPLANT
ELECTRODE REM PT RTRN 9FT ADLT (ELECTROSURGICAL) ×4 IMPLANT
FELT TEFLON 1X6 (MISCELLANEOUS) ×6 IMPLANT
GAUZE SPONGE 4X4 12PLY STRL (GAUZE/BANDAGES/DRESSINGS) ×6 IMPLANT
GLOVE BIO SURGEON STRL SZ 6 (GLOVE) IMPLANT
GLOVE BIO SURGEON STRL SZ 6.5 (GLOVE) ×18 IMPLANT
GLOVE BIO SURGEON STRL SZ7 (GLOVE) IMPLANT
GLOVE BIO SURGEON STRL SZ7.5 (GLOVE) IMPLANT
GLOVE BIOGEL PI IND STRL 6 (GLOVE) IMPLANT
GLOVE BIOGEL PI IND STRL 6.5 (GLOVE) ×14 IMPLANT
GLOVE BIOGEL PI IND STRL 7.0 (GLOVE) IMPLANT
GLOVE BIOGEL PI INDICATOR 6 (GLOVE)
GLOVE BIOGEL PI INDICATOR 6.5 (GLOVE) ×7
GLOVE BIOGEL PI INDICATOR 7.0 (GLOVE)
GLOVE INDICATOR 7.0 STRL GRN (GLOVE) ×3 IMPLANT
GLOVE ORTHO TXT STRL SZ7.5 (GLOVE) IMPLANT
GLOVE SURG SS PI 7.5 STRL IVOR (GLOVE) ×3 IMPLANT
GLOVE TRIUMPH SURG SIZE 7.0 (KITS) ×9 IMPLANT
GOWN STRL REUS W/ TWL LRG LVL3 (GOWN DISPOSABLE) ×8 IMPLANT
GOWN STRL REUS W/ TWL XL LVL3 (GOWN DISPOSABLE) ×2 IMPLANT
GOWN STRL REUS W/TWL LRG LVL3 (GOWN DISPOSABLE) ×4
GOWN STRL REUS W/TWL XL LVL3 (GOWN DISPOSABLE) ×1
HEMOSTAT POWDER SURGIFOAM 1G (HEMOSTASIS) ×9 IMPLANT
HEMOSTAT SURGICEL 2X14 (HEMOSTASIS) ×3 IMPLANT
INSERT FOGARTY 61MM (MISCELLANEOUS) IMPLANT
INSERT FOGARTY XLG (MISCELLANEOUS) IMPLANT
KIT BASIN OR (CUSTOM PROCEDURE TRAY) ×3 IMPLANT
KIT CATH CPB BARTLE (MISCELLANEOUS) ×3 IMPLANT
KIT SUCTION CATH 14FR (SUCTIONS) ×3 IMPLANT
KIT TURNOVER KIT B (KITS) ×3 IMPLANT
KIT VASOVIEW HEMOPRO 2 VH 4000 (KITS) ×3 IMPLANT
NS IRRIG 1000ML POUR BTL (IV SOLUTION) ×15 IMPLANT
PACK E OPEN HEART (SUTURE) ×3 IMPLANT
PACK OPEN HEART (CUSTOM PROCEDURE TRAY) ×3 IMPLANT
PAD ARMBOARD 7.5X6 YLW CONV (MISCELLANEOUS) ×6 IMPLANT
PAD CARDIAC INSULATION (MISCELLANEOUS) ×3 IMPLANT
PAD ELECT DEFIB RADIOL ZOLL (MISCELLANEOUS) ×3 IMPLANT
PENCIL BUTTON HOLSTER BLD 10FT (ELECTRODE) ×6 IMPLANT
POSITIONER HEAD DONUT 9IN (MISCELLANEOUS) ×3 IMPLANT
PUNCH AORTIC ROTATE 4.0MM (MISCELLANEOUS) IMPLANT
PUNCH AORTIC ROTATE 4.5MM 8IN (MISCELLANEOUS) ×3 IMPLANT
PUNCH AORTIC ROTATE 5MM 8IN (MISCELLANEOUS) IMPLANT
SET CARDIOPLEGIA MPS 5001102 (MISCELLANEOUS) ×3 IMPLANT
SPONGE INTESTINAL PEANUT (DISPOSABLE) ×3 IMPLANT
SPONGE LAP 18X18 RF (DISPOSABLE) ×3 IMPLANT
SPONGE LAP 4X18 RFD (DISPOSABLE) ×3 IMPLANT
SUPPORT HEART JANKE-BARRON (MISCELLANEOUS) ×3 IMPLANT
SUT BONE WAX W31G (SUTURE) ×3 IMPLANT
SUT MNCRL AB 4-0 PS2 18 (SUTURE) ×3 IMPLANT
SUT PROLENE 3 0 SH DA (SUTURE) IMPLANT
SUT PROLENE 3 0 SH1 36 (SUTURE) ×3 IMPLANT
SUT PROLENE 4 0 RB 1 (SUTURE)
SUT PROLENE 4 0 SH DA (SUTURE) IMPLANT
SUT PROLENE 4-0 RB1 .5 CRCL 36 (SUTURE) IMPLANT
SUT PROLENE 5 0 C 1 36 (SUTURE) IMPLANT
SUT PROLENE 6 0 C 1 30 (SUTURE) ×3 IMPLANT
SUT PROLENE 7 0 BV 1 (SUTURE) IMPLANT
SUT PROLENE 7 0 BV1 MDA (SUTURE) ×3 IMPLANT
SUT PROLENE 8 0 BV175 6 (SUTURE) IMPLANT
SUT SILK  1 MH (SUTURE)
SUT SILK 1 MH (SUTURE) IMPLANT
SUT SILK 2 0 SH (SUTURE) IMPLANT
SUT STEEL 6MS V (SUTURE) ×6 IMPLANT
SUT STEEL STERNAL CCS#1 18IN (SUTURE) IMPLANT
SUT STEEL SZ 6 DBL 3X14 BALL (SUTURE) IMPLANT
SUT VIC AB 1 CTX 36 (SUTURE) ×3
SUT VIC AB 1 CTX36XBRD ANBCTR (SUTURE) ×6 IMPLANT
SUT VIC AB 2-0 CT1 27 (SUTURE) ×1
SUT VIC AB 2-0 CT1 TAPERPNT 27 (SUTURE) ×2 IMPLANT
SUT VIC AB 2-0 CTX 27 (SUTURE) IMPLANT
SUT VIC AB 3-0 SH 27 (SUTURE)
SUT VIC AB 3-0 SH 27X BRD (SUTURE) IMPLANT
SUT VIC AB 3-0 X1 27 (SUTURE) IMPLANT
SUT VICRYL 4-0 PS2 18IN ABS (SUTURE) IMPLANT
SYR BULB IRRIG 60ML STRL (SYRINGE) ×3 IMPLANT
SYSTEM SAHARA CHEST DRAIN ATS (WOUND CARE) ×3 IMPLANT
TAPE CLOTH SURG 4X10 WHT LF (GAUZE/BANDAGES/DRESSINGS) ×3 IMPLANT
TOWEL GREEN STERILE (TOWEL DISPOSABLE) ×3 IMPLANT
TOWEL GREEN STERILE FF (TOWEL DISPOSABLE) ×3 IMPLANT
TRAY FOLEY SLVR 16FR TEMP STAT (SET/KITS/TRAYS/PACK) ×3 IMPLANT
TUBING LAP HI FLOW INSUFFLATIO (TUBING) ×3 IMPLANT
UNDERPAD 30X36 HEAVY ABSORB (UNDERPADS AND DIAPERS) ×3 IMPLANT
WATER STERILE IRR 1000ML POUR (IV SOLUTION) ×6 IMPLANT

## 2019-09-11 NOTE — OR Nursing (Signed)
Forty-five minute call to 2 Heart Charge Nurse at 1155. Spoke to [place].  Twenty minute call to 2 Heart Charge Nurse at 1225. Spoke to [place]. Cath Lab also notified of timing.

## 2019-09-11 NOTE — Anesthesia Procedure Notes (Signed)
Central Venous Catheter Insertion Performed by: Oleta Mouse, MD, anesthesiologist Start/End8/23/2021 7:07 AM, 09/11/2019 7:17 AM Patient location: Pre-op. Preanesthetic checklist: patient identified, IV checked, site marked, risks and benefits discussed, surgical consent, monitors and equipment checked, pre-op evaluation, timeout performed and anesthesia consent Lidocaine 1% used for infiltration and patient sedated Hand hygiene performed  and maximum sterile barriers used  Catheter size: 8.5 Fr Sheath introducer Procedure performed using ultrasound guided technique. Ultrasound Notes:anatomy identified, needle tip was noted to be adjacent to the nerve/plexus identified, no ultrasound evidence of intravascular and/or intraneural injection and image(s) printed for medical record Attempts: 1 Following insertion, line sutured, dressing applied and Biopatch. Post procedure assessment: blood return through all ports, free fluid flow and no air  Patient tolerated the procedure well with no immediate complications.

## 2019-09-11 NOTE — Transfer of Care (Addendum)
Immediate Anesthesia Transfer of Care Note  Patient: Carlos Davidson  Procedure(s) Performed: CORONARY ARTERY BYPASS GRAFTING (CABG), ON PUMP, TIMES THREE, USING LEFT INTERNAL MAMMARY ARTERY AND ENDOSCOPICALLY HARVESTED RIGHT GREATER SAPHENOUS VEIN (N/A Chest) TRANSESOPHAGEAL ECHOCARDIOGRAM (TEE) (N/A Esophagus)  Patient Location: ICU  Anesthesia Type:General  Level of Consciousness: Patient remains intubated per anesthesia plan  Airway & Oxygen Therapy: Patient remains intubated per anesthesia plan and Patient placed on Ventilator (see vital sign flow sheet for setting)  Post-op Assessment: Report given to RN and Post -op Vital signs reviewed and stable  Post vital signs: Reviewed and stable  Last Vitals:  Vitals Value Taken Time  BP    Temp    Pulse 80 09/11/19 1308  Resp 12 09/11/19 1308  SpO2 98 % 09/11/19 1308  Vitals shown include unvalidated device data.  Last Pain:  Vitals:   09/11/19 0458  TempSrc: Oral  PainSc:       Patients Stated Pain Goal: 0 (57/47/34 0370)  Complications: No complications documented.

## 2019-09-11 NOTE — Procedures (Signed)
Extubation Procedure Note  Patient Details:   Name: Carlos Davidson DOB: 1940-11-01 MRN: [id number]   Airway Documentation:    Vent end date: 09/11/19 Vent end time: 1820   Evaluation  O2 sats: stable throughout Complications: No apparent complications Patient did tolerate procedure well. Bilateral Breath Sounds: Clear, Diminished   Yes  NIF -25 VC 950 mL  Pt was extubated to 4L [place] per SIMV wean protocol and MD order. Pt tolerated procedure well. Pt was suctioned and had a positive cuff leak prior to extubation. No stridor heard at this time. RT will continue to monitor.   Otelia Sergeant 09/11/2019, 6:24 PM

## 2019-09-11 NOTE — Anesthesia Preprocedure Evaluation (Addendum)
Anesthesia Evaluation  Patient identified by MRN, date of birth, ID band Patient awake    Reviewed: Allergy & Precautions, NPO status , Patient's Chart, lab work & pertinent test results  History of Anesthesia Complications Negative for: history of anesthetic complications  Airway Mallampati: II  TM Distance: >3 FB Neck ROM: Full    Dental  (+) Dental Advisory Given, Teeth Intact   Pulmonary neg pulmonary ROS, neg recent URI,    breath sounds clear to auscultation       Cardiovascular hypertension, + angina + CAD and + Past MI   Rhythm:Regular  1. Left ventricular ejection fraction, by estimation, is 60 to 65%. The  left ventricle has normal function. The left ventricle has no regional  wall motion abnormalities. Left ventricular diastolic parameters were  normal.  2. Right ventricular systolic function is normal. The right ventricular  size is normal.  3. No significant valvular abnormality.  4. Normal right atrial pressure.   Neuro/Psych PSYCHIATRIC DISORDERS Depression  Neuromuscular disease    GI/Hepatic negative GI ROS, Denies swallowing issues   Endo/Other  negative endocrine ROS  Renal/GU      Musculoskeletal  (+) Arthritis ,   Abdominal   Peds  Hematology negative hematology ROS (+)   Anesthesia Other Findings LM: Distal 40% stenosis w/severe calcification LAD: Ostial 90% stenosis with severe calcification LCx: No obvious lesion in LCx, but with TIMI II flow        OM1 long 90% stenosis RCA: Prox-mid 20% stenosis   Reproductive/Obstetrics                            Anesthesia Physical Anesthesia Plan  ASA: IV  Anesthesia Plan: General   Post-op Pain Management:    Induction: Intravenous  PONV Risk Score and Plan: 2 and Ondansetron and Treatment may vary due to age or medical condition  Airway Management Planned: Oral ETT  Additional Equipment: Arterial line,  CVP, PA Cath, TEE and Ultrasound Guidance Line Placement  Intra-op Plan:   Post-operative Plan: Post-operative intubation/ventilation  Informed Consent: I have reviewed the patients History and Physical, chart, labs and discussed the procedure including the risks, benefits and alternatives for the proposed anesthesia with the patient or authorized representative who has indicated his/her understanding and acceptance.     Dental advisory given  Plan Discussed with: CRNA and Surgeon  Anesthesia Plan Comments:         Anesthesia Quick Evaluation

## 2019-09-11 NOTE — Anesthesia Procedure Notes (Signed)
Central Venous Catheter Insertion Performed by: Oleta Mouse, MD, anesthesiologist Start/End8/23/2021 7:07 AM, 09/11/2019 7:17 AM Patient location: Pre-op. Preanesthetic checklist: patient identified, IV checked, site marked, risks and benefits discussed, surgical consent, monitors and equipment checked, pre-op evaluation, timeout performed and anesthesia consent Hand hygiene performed  and maximum sterile barriers used  PA cath was placed.Swan type:thermodilution Procedure performed without using ultrasound guided technique. Attempts: 1 Patient tolerated the procedure well with no immediate complications.

## 2019-09-11 NOTE — Progress Notes (Signed)
      [address]       [place] [address]             [phone]      S/p CABG x 3  Intubated, starting to wake up BP 117/84   Pulse 89   Temp (!) 97.5 F (36.4 C)   Resp 20   Ht 5\' 11"  (1.803 m)   Wt 85.2 kg   SpO2 100%   BMI 26.19 kg/m  CI= 1.8  Intake/Output Summary (Last 24 hours) at 09/11/2019 1756 Last data filed at 09/11/2019 1700 Gross per 24 hour  Intake 4449.66 ml  Output 2385 ml  Net 2064.66 ml   K= 4.5 Hct= 32  Doing well Should be able to extubate soon.  Revonda Standard Roxan Hockey, MD [facility] [phone]

## 2019-09-11 NOTE — Progress Notes (Signed)
  Echocardiogram Echocardiogram Transesophageal has been performed.  Michiel Cowboy 09/11/2019, 8:35 AM

## 2019-09-11 NOTE — Brief Op Note (Signed)
09/06/2019 - 09/11/2019  11:30 AM  PATIENT:  Leretha Pol  79 y.o. male  PRE-OPERATIVE DIAGNOSIS:  CAD, Acute NSTEMI  POST-OPERATIVE DIAGNOSIS:  CAD, Acute NSTEMI PROCEDURE:   CORONARY ARTERY BYPASS GRAFTING x 3   LIMA->LAD SVG->DIAG SVG->OM  Endoscopic vein harvest time:  25 min    Prep time: 20 min  TRANSESOPHAGEAL ECHOCARDIOGRAM (TEE) (N/A)  SURGEON:  Gaye Pollack, MD - Primary  PHYSICIAN ASSISTANT: Primo Innis  ASSISTANTS: Staff RNFA  ANESTHESIA:   general  EBL:  Per anesthesia and perfusion records   BLOOD ADMINISTERED:none  DRAINS: Mediastinal and left pleural tubes   LOCAL MEDICATIONS USED:  NONE  SPECIMEN:  No Specimen  DISPOSITION OF SPECIMEN:  N/A  COUNTS:  YES  DICTATION: .Dragon Dictation  PLAN OF CARE: Admit to inpatient   PATIENT DISPOSITION:  ICU - intubated and hemodynamically stable.   Delay start of Pharmacological VTE agent (>24hrs) due to surgical blood loss or risk of bleeding: yes

## 2019-09-11 NOTE — Anesthesia Procedure Notes (Signed)
Arterial Line Insertion Start/End8/23/2021 6:50 AM, 09/11/2019 7:00 AM Performed by: Griffin Dakin, CRNA, CRNA  Patient location: Pre-op. Preanesthetic checklist: patient identified, IV checked, site marked, risks and benefits discussed, surgical consent, monitors and equipment checked, pre-op evaluation and timeout performed Lidocaine 1% used for infiltration radial was placed Catheter size: 20 G Hand hygiene performed , maximum sterile barriers used  and Seldinger technique used Allen's test indicative of satisfactory collateral circulation Attempts: 2 Procedure performed without using ultrasound guided technique. Following insertion, dressing applied and Biopatch. Post procedure assessment: normal  Patient tolerated the procedure well with no immediate complications. Additional procedure comments: Placed by Hilda Blades.

## 2019-09-11 NOTE — Anesthesia Procedure Notes (Signed)
Procedure Name: Intubation Date/Time: 09/11/2019 7:59 AM Performed by: Griffin Dakin, CRNA Pre-anesthesia Checklist: Patient identified, Emergency Drugs available, Suction available, Timeout performed and Patient being monitored Patient Re-evaluated:Patient Re-evaluated prior to induction Oxygen Delivery Method: Circle system utilized Preoxygenation: Pre-oxygenation with 100% oxygen Induction Type: IV induction Ventilation: Two handed mask ventilation required Laryngoscope Size: Miller and 2 Grade View: Grade I Laser Tube: Cuffed inflated with minimal occlusive pressure - saline Tube size: 7.5 mm Number of attempts: 1 Airway Equipment and Method: Stylet Secured at: 22 cm Tube secured with: Tape Dental Injury: Teeth and Oropharynx as per pre-operative assessment  Comments: Placed by Hilda Blades

## 2019-09-11 NOTE — Op Note (Signed)
CARDIOVASCULAR SURGERY OPERATIVE NOTE  09/11/2019  Surgeon:  Gaye Pollack, MD  First Assistant: Enid Cutter,  PA-C   Preoperative Diagnosis:  Severe multi-vessel coronary artery disease   Postoperative Diagnosis:  Same   Procedure:  1. Median Sternotomy 2. Extracorporeal circulation 3.   Coronary artery bypass grafting x 3   Left internal mammary artery graft to the LAD  SVG to diagonal  SVG to OM  4.   Endoscopic vein harvest from the right leg   Anesthesia:  General Endotracheal   Clinical History/Surgical Indication:  This active 79 year old gentleman has high-grade calcified ostial LAD and high-grade calcified proximal first marginal stenoses with mild distal left main tapering.  He presented with unstable angina and ruled in for non-ST segment elevation MI.  I agree that coronary artery bypass graft surgery with a left internal mammary graft to the LAD and a graft to his first marginal is the best treatment for him.  PCI and stenting of his calcified ostial LAD stenosis would have increased risk due to the calcified lesion extending back into the left main. I discussed the operative procedure with the patient, wife and son including alternatives, benefits and risks; including but not limited to bleeding, blood transfusion, infection, stroke, myocardial infarction, graft failure, heart block requiring a permanent pacemaker, organ dysfunction, and death.  Carlos Davidson understands and agrees to proceed.  Preparation:  The patient was seen in the preoperative holding area and the correct patient, correct operation were confirmed with the patient after reviewing the medical record and catheterization. The consent was signed by me. Preoperative antibiotics were given. A pulmonary arterial line and radial arterial line were placed by the anesthesia team. The patient was taken  back to the operating room and positioned supine on the operating room table. After being placed under general endotracheal anesthesia by the anesthesia team a foley catheter was placed. The neck, chest, abdomen, and both legs were prepped with betadine soap and solution and draped in the usual sterile manner. A surgical time-out was taken and the correct patient and operative procedure were confirmed with the nursing and anesthesia staff.   Cardiopulmonary Bypass:  A median sternotomy was performed. The pericardium was opened in the midline. Right ventricular function appeared normal. The ascending aorta was of normal size and had no palpable plaque. There were no contraindications to aortic cannulation or cross-clamping. The patient was fully systemically heparinized and the ACT was maintained > 400 sec. The proximal aortic arch was cannulated with a 20 F aortic cannula for arterial inflow. Venous cannulation was performed via the right atrial appendage using a two-staged venous cannula. An antegrade cardioplegia/vent cannula was inserted into the mid-ascending aorta. Aortic occlusion was performed with a single cross-clamp. Systemic cooling to 32 degrees Centigrade and topical cooling of the heart with iced saline were used. Hyperkalemic antegrade cold blood cardioplegia was used to induce diastolic arrest and was then given at about 20 minute intervals throughout the period of arrest to maintain myocardial temperature at or below 10 degrees centigrade. A temperature probe was inserted into the interventricular septum and an insulating pad was placed in the pericardium.   Left internal mammary artery harvest:  The left side of the sternum was retracted using the Rultract retractor. The left internal mammary artery was harvested as a pedicle graft. All side branches were clipped. It was a medium-sized vessel of good quality with excellent blood flow. It was ligated distally and divided. It was sprayed  with topical  papaverine solution to prevent vasospasm.   Endoscopic vein harvest:  The right greater saphenous vein was harvested endoscopically through a 2 cm incision medial to the right knee. It was harvested from the upper thigh to below the knee. It was a medium-sized vein of good quality. The side branches were all ligated with 4-0 silk ties.    Coronary arteries:  The coronary arteries were examined.   LAD:  Intramyocardial throughout its entire length but located in the mid portion where it was a large vessel with no disease. There was also a large branching diagonal that was almost as large as the LAD and was completely occluded. In retrospect looking at the cath there is an occluded diagonal that barely shows up with a little collateral flow and looks small.   LCX:  OM was a medium caliber vessel with no distal disease.  RCA:  No significant disease   Grafts:  1. LIMA to the LAD: 2.5 mm. It was sewn end to side using 8-0 prolene continuous suture. 2. SVG to diagonal:  2.0 mm. It was sewn end to side using 7-0 prolene continuous suture. 3. SVG to OM1:  1.75 mm. It was sewn end to side using 7-0 prolene continuous suture.   The proximal vein graft anastomoses were performed to the mid-ascending aorta using continuous 6-0 prolene suture. Graft markers were placed around the proximal anastomoses.   Completion:  The patient was rewarmed to 37 degrees Centigrade. The clamp was removed from the LIMA pedicle and there was rapid warming of the septum and return of ventricular fibrillation. The crossclamp was removed with a time of 70 minutes. There was spontaneous return of sinus rhythm. The distal and proximal anastomoses were checked for hemostasis. The position of the grafts was satisfactory. Two temporary epicardial pacing wires were placed on the right atrium and two on the right ventricle. The patient was weaned from CPB without difficulty on no inotropes. CPB time was 87  minutes. Cardiac output was 5 LPM. TEE showed normal LV systolic function. Heparin was fully reversed with protamine and the aortic and venous cannulas removed. Hemostasis was achieved. Mediastinal and left pleural drainage tubes were placed. The sternum was closed with  #6 stainless steel wires. The fascia was closed with continuous # 1 vicryl suture. The subcutaneous tissue was closed with 2-0 vicryl continuous suture. The skin was closed with 3-0 vicryl subcuticular suture. All sponge, needle, and instrument counts were reported correct at the end of the case. Dry sterile dressings were placed over the incisions and around the chest tubes which were connected to pleurevac suction. The patient was then transported to the surgical intensive care unit in stable condition.

## 2019-09-12 ENCOUNTER — Inpatient Hospital Stay (HOSPITAL_COMMUNITY): Payer: Medicare HMO

## 2019-09-12 ENCOUNTER — Encounter (HOSPITAL_COMMUNITY): Payer: Self-pay | Admitting: Surgery

## 2019-09-12 LAB — BASIC METABOLIC PANEL
Anion gap: 10 (ref 5–15)
Anion gap: 9 (ref 5–15)
BUN: 15 mg/dL (ref 8–23)
BUN: 16 mg/dL (ref 8–23)
CO2: 20 mmol/L — ABNORMAL LOW (ref 22–32)
CO2: 23 mmol/L (ref 22–32)
Calcium: 7.5 mg/dL — ABNORMAL LOW (ref 8.9–10.3)
Calcium: 7.8 mg/dL — ABNORMAL LOW (ref 8.9–10.3)
Chloride: 107 mmol/L (ref 98–111)
Chloride: 104 mmol/L (ref 98–111)
Creatinine, Ser: 1.17 mg/dL (ref 0.61–1.24)
Creatinine, Ser: 1.31 mg/dL — ABNORMAL HIGH (ref 0.61–1.24)
GFR calc Af Amer: >60 mL/min (ref >60)
GFR calc Af Amer: >60 mL/min (ref >60)
GFR calc non Af Amer: 60 mL/min — ABNORMAL LOW (ref >60)
GFR calc non Af Amer: 52 mL/min — ABNORMAL LOW (ref >60)
Glucose, Bld: 102 mg/dL — ABNORMAL HIGH (ref 70–99)
Glucose, Bld: 164 mg/dL — ABNORMAL HIGH (ref 70–99)
Potassium: 4.3 mmol/L (ref 3.5–5.1)
Potassium: 4.2 mmol/L (ref 3.5–5.1)
Sodium: 137 mmol/L (ref 135–145)
Sodium: 136 mmol/L (ref 135–145)

## 2019-09-12 LAB — GLUCOSE, CAPILLARY
Glucose-Capillary: 71 mg/dL (ref 70–99)
Glucose-Capillary: 85 mg/dL (ref 70–99)
Glucose-Capillary: 99 mg/dL (ref 70–99)

## 2019-09-12 LAB — CBC
HCT: 35.5 % — ABNORMAL LOW (ref 39.0–52.0)
HCT: 35.8 % — ABNORMAL LOW (ref 39.0–52.0)
Hemoglobin: 11.9 g/dL — ABNORMAL LOW (ref 13.0–17.0)
Hemoglobin: 11.6 g/dL — ABNORMAL LOW (ref 13.0–17.0)
MCH: 32.1 pg (ref 26.0–34.0)
MCH: 30.9 pg (ref 26.0–34.0)
MCHC: 33.5 g/dL (ref 30.0–36.0)
MCHC: 32.4 g/dL (ref 30.0–36.0)
MCV: 95.7 fL (ref 80.0–100.0)
MCV: 95.5 fL (ref 80.0–100.0)
Platelets: 157 10*3/uL (ref 150–400)
Platelets: 152 10*3/uL (ref 150–400)
RBC: 3.71 MIL/uL — ABNORMAL LOW (ref 4.22–5.81)
RBC: 3.75 MIL/uL — ABNORMAL LOW (ref 4.22–5.81)
RDW: 12.9 % (ref 11.5–15.5)
RDW: 12.9 % (ref 11.5–15.5)
WBC: 9.9 10*3/uL (ref 4.0–10.5)
WBC: 11.8 10*3/uL — ABNORMAL HIGH (ref 4.0–10.5)
nRBC: 0 % (ref 0.0–0.2)
nRBC: 0 % (ref 0.0–0.2)

## 2019-09-12 LAB — MAGNESIUM
Magnesium: 2.6 mg/dL — ABNORMAL HIGH (ref 1.7–2.4)
Magnesium: 2.3 mg/dL (ref 1.7–2.4)

## 2019-09-12 MED ORDER — FUROSEMIDE 10 MG/ML IJ SOLN
40.0000 mg | Freq: Once | INTRAMUSCULAR | Status: AC
  Administered 2019-09-12: 40 mg via INTRAVENOUS
  Filled 2019-09-12: qty 4

## 2019-09-12 MED ORDER — ENOXAPARIN SODIUM 40 MG/0.4ML ~~LOC~~ SOLN
40.0000 mg | Freq: Every day | SUBCUTANEOUS | Status: DC
  Administered 2019-09-12 – 2019-09-15 (×4): 40 mg via SUBCUTANEOUS
  Filled 2019-09-12 (×4): qty 0.4

## 2019-09-12 MED ORDER — SERTRALINE HCL 50 MG PO TABS
50.0000 mg | ORAL_TABLET | Freq: Every day | ORAL | Status: DC
  Administered 2019-09-13 – 2019-09-15 (×3): 50 mg via ORAL
  Filled 2019-09-12 (×4): qty 1

## 2019-09-12 MED FILL — Thrombin (Recombinant) For Soln 20000 Unit: CUTANEOUS | Qty: 1 | Status: AC

## 2019-09-12 NOTE — Progress Notes (Signed)
[name], male   DOB: 09-12-1940, 79 y.o.   MRN: [id number] [facility] Evening Rounds:  Hemodynamically stable in sinus rhythm.  Ambulated today.  Good urine output with lasix this am. Will give another dose this pm  PM labs pending.

## 2019-09-12 NOTE — Anesthesia Postprocedure Evaluation (Signed)
Anesthesia Post Note  Patient: VASH QUEZADA  Procedure(s) Performed: CORONARY ARTERY BYPASS GRAFTING (CABG), ON PUMP, TIMES THREE, USING LEFT INTERNAL MAMMARY ARTERY AND ENDOSCOPICALLY HARVESTED RIGHT GREATER SAPHENOUS VEIN (N/A Chest) TRANSESOPHAGEAL ECHOCARDIOGRAM (TEE) (N/A Esophagus)     Patient location during evaluation: SICU Anesthesia Type: General Level of consciousness: sedated Pain management: pain level controlled Vital Signs Assessment: post-procedure vital signs reviewed and stable Respiratory status: patient remains intubated per anesthesia plan Cardiovascular status: stable Postop Assessment: no apparent nausea or vomiting Anesthetic complications: no   No complications documented.  Last Vitals:  Vitals:   09/12/19 0500 09/12/19 0600  BP: 106/69 121/60  Pulse: 76 79  Resp: 20 (!) 24  Temp: 37.1 C 37.4 C  SpO2: 94% 93%    Last Pain:  Vitals:   09/12/19 0400  TempSrc: Core  PainSc: 3                  Rozanna Cormany

## 2019-09-12 NOTE — Progress Notes (Signed)
1 Day Post-Op Procedure(s) (LRB): CORONARY ARTERY BYPASS GRAFTING (CABG), ON PUMP, TIMES THREE, USING LEFT INTERNAL MAMMARY ARTERY AND ENDOSCOPICALLY HARVESTED RIGHT GREATER SAPHENOUS VEIN (N/A) TRANSESOPHAGEAL ECHOCARDIOGRAM (TEE) (N/A) Subjective: No complaints  Objective: Vital signs in last 24 hours: Temp:  [96.1 F (35.6 C)-99.3 F (37.4 C)] 99 F (37.2 C) (08/24 0700) Pulse Rate:  [76-89] 80 (08/24 0700) Cardiac Rhythm: Atrial paced (08/23 2000) Resp:  [12-25] 23 (08/24 0700) BP: (92-128)/(60-88) 128/76 (08/24 0700) SpO2:  [93 %-100 %] 96 % (08/24 0700) Arterial Line BP: (105-156)/(51-77) 156/61 (08/24 0700) FiO2 (%):  [40 %-50 %] 40 % (08/23 1710) Weight:  [90.2 kg] 90.2 kg (08/24 0500)  Hemodynamic parameters for last 24 hours: PAP: (23-37)/(9-27) 37/16 CO:  [2.9 L/min-5.4 L/min] 5.4 L/min CI:  [1.4 L/min/m2-2.6 L/min/m2] 2.6 L/min/m2  Intake/Output from previous day: 08/23 0701 - 08/24 0700 In: 4770.4 [I.V.:3091.8; Blood:490; IV Piggyback:1188.6] Out: 2795 [Urine:2525; Chest Tube:270] Intake/Output this shift: No intake/output data recorded.  General appearance: alert and cooperative Neurologic: intact Heart: regular rate and rhythm, S1, S2 normal, no murmur Lungs: clear to auscultation bilaterally Extremities: edema mild Wound: dressing dry  Lab Results: Recent Labs    09/11/19 1915 09/11/19 1923 09/11/19 2149 09/12/19 0405  WBC 8.7  --   --  9.9  HGB 12.4*   < > 10.5* 11.9*  HCT 37.1*   < > 31.0* 35.5*  PLT 148*  --   --  157   < > = values in this interval not displayed.   BMET:  Recent Labs    09/11/19 1915 09/11/19 1923 09/11/19 2149 09/12/19 0405  NA 138   < > 140 137  K 4.7   < > 4.1 4.3  CL 108  --   --  107  CO2 19*  --   --  20*  GLUCOSE 108*  --   --  102*  BUN 15  --   --  15  CREATININE 0.96  --   --  1.17  CALCIUM 7.4*  --   --  7.5*   < > = values in this interval not displayed.    PT/INR:  Recent Labs    09/11/19 1311   LABPROT 16.3*  INR 1.4*   ABG    Component Value Date/Time   PHART 7.288 (L) 09/11/2019 2149   HCO3 19.6 (L) 09/11/2019 2149   TCO2 21 (L) 09/11/2019 2149   ACIDBASEDEF 7.0 (H) 09/11/2019 2149   O2SAT 98.0 09/11/2019 2149   CBG (last 3)  Recent Labs    09/11/19 2147 09/12/19 0016 09/12/19 0407  GLUCAP 128* 85  71 99   CXR: mild left base atelectasis  ECG: sinus, no acute changes  Assessment/Plan: S/P Procedure(s) (LRB): CORONARY ARTERY BYPASS GRAFTING (CABG), ON PUMP, TIMES THREE, USING LEFT INTERNAL MAMMARY ARTERY AND ENDOSCOPICALLY HARVESTED RIGHT GREATER SAPHENOUS VEIN (N/A) TRANSESOPHAGEAL ECHOCARDIOGRAM (TEE) (N/A)  POD 1  Hemodynamically stable in sinus rhythm. Continue Lopressor.  Glucose under good control and no hx of DM with Hgb A1c 5.7. Will stop CBG's  DC all chest tubes, swan, arterial line.  Volume excess: wt is 10-11 lbs over preop. Start diuresis.  IS, OOB, ambulate later today.  Plan to start Plavix in addition to ASA prior to discharge with ACS.   LOS: 6 days    Gaye Pollack 09/12/2019

## 2019-09-13 ENCOUNTER — Other Ambulatory Visit: Payer: Self-pay

## 2019-09-13 ENCOUNTER — Inpatient Hospital Stay (HOSPITAL_COMMUNITY): Payer: Medicare HMO

## 2019-09-13 LAB — CBC
HCT: 35.2 % — ABNORMAL LOW (ref 39.0–52.0)
Hemoglobin: 11.7 g/dL — ABNORMAL LOW (ref 13.0–17.0)
MCH: 31.2 pg (ref 26.0–34.0)
MCHC: 33.2 g/dL (ref 30.0–36.0)
MCV: 93.9 fL (ref 80.0–100.0)
Platelets: 162 10*3/uL (ref 150–400)
RBC: 3.75 MIL/uL — ABNORMAL LOW (ref 4.22–5.81)
RDW: 13 % (ref 11.5–15.5)
WBC: 12 10*3/uL — ABNORMAL HIGH (ref 4.0–10.5)
nRBC: 0 % (ref 0.0–0.2)

## 2019-09-13 LAB — BASIC METABOLIC PANEL
Anion gap: 10 (ref 5–15)
BUN: 17 mg/dL (ref 8–23)
CO2: 23 mmol/L (ref 22–32)
Calcium: 7.6 mg/dL — ABNORMAL LOW (ref 8.9–10.3)
Chloride: 102 mmol/L (ref 98–111)
Creatinine, Ser: 1.22 mg/dL (ref 0.61–1.24)
GFR calc Af Amer: >60 mL/min (ref >60)
GFR calc non Af Amer: 57 mL/min — ABNORMAL LOW (ref >60)
Glucose, Bld: 193 mg/dL — ABNORMAL HIGH (ref 70–99)
Potassium: 3.9 mmol/L (ref 3.5–5.1)
Sodium: 135 mmol/L (ref 135–145)

## 2019-09-13 MED ORDER — SODIUM CHLORIDE 0.9% FLUSH: 3.0000 mL | INTRAVENOUS | Status: DC | PRN

## 2019-09-13 MED ORDER — POTASSIUM CHLORIDE CRYS ER 20 MEQ PO TBCR
20.0000 meq | EXTENDED_RELEASE_TABLET | Freq: Every day | ORAL | Status: DC
  Administered 2019-09-14: 20 meq via ORAL
  Filled 2019-09-13: qty 1

## 2019-09-13 MED ORDER — METOPROLOL TARTRATE 12.5 MG HALF TABLET
12.5000 mg | ORAL_TABLET | Freq: Two times a day (BID) | ORAL | Status: DC
  Administered 2019-09-13 – 2019-09-14 (×3): 12.5 mg via ORAL
  Filled 2019-09-13 (×3): qty 1

## 2019-09-13 MED ORDER — SODIUM CHLORIDE 0.9 % IV SOLN: 250.0000 mL | INTRAVENOUS | Status: DC | PRN

## 2019-09-13 MED ORDER — SENNOSIDES-DOCUSATE SODIUM 8.6-50 MG PO TABS
1.0000 | ORAL_TABLET | Freq: Two times a day (BID) | ORAL | Status: DC
  Administered 2019-09-15 – 2019-09-16 (×3): 1 via ORAL
  Filled 2019-09-13 (×4): qty 1

## 2019-09-13 MED ORDER — TRAMADOL HCL 50 MG PO TABS: 50.0000 mg | ORAL_TABLET | ORAL | Status: DC | PRN

## 2019-09-13 MED ORDER — OXYCODONE HCL 5 MG PO TABS: 5.0000 mg | ORAL_TABLET | ORAL | Status: DC | PRN

## 2019-09-13 MED ORDER — ONDANSETRON HCL 4 MG/2ML IJ SOLN
4.0000 mg | Freq: Four times a day (QID) | INTRAMUSCULAR | Status: DC | PRN
  Administered 2019-09-14: 4 mg via INTRAVENOUS
  Filled 2019-09-13: qty 2

## 2019-09-13 MED ORDER — ASPIRIN EC 325 MG PO TBEC
325.0000 mg | DELAYED_RELEASE_TABLET | Freq: Every day | ORAL | Status: DC
  Administered 2019-09-14: 325 mg via ORAL
  Filled 2019-09-13: qty 1

## 2019-09-13 MED ORDER — ONDANSETRON HCL 4 MG PO TABS: 4.0000 mg | ORAL_TABLET | Freq: Four times a day (QID) | ORAL | Status: DC | PRN

## 2019-09-13 MED ORDER — FUROSEMIDE 40 MG PO TABS
40.0000 mg | ORAL_TABLET | Freq: Every day | ORAL | Status: DC
  Administered 2019-09-14: 40 mg via ORAL
  Filled 2019-09-13: qty 1

## 2019-09-13 MED ORDER — SODIUM CHLORIDE 0.9% FLUSH
3.0000 mL | Freq: Two times a day (BID) | INTRAVENOUS | Status: DC
  Administered 2019-09-13 – 2019-09-16 (×7): 3 mL via INTRAVENOUS

## 2019-09-13 MED ORDER — ~~LOC~~ CARDIAC SURGERY, PATIENT & FAMILY EDUCATION: Freq: Once | Status: AC

## 2019-09-13 MED ORDER — PANTOPRAZOLE SODIUM 40 MG PO TBEC
40.0000 mg | DELAYED_RELEASE_TABLET | Freq: Every day | ORAL | Status: DC
  Administered 2019-09-14 – 2019-09-16 (×3): 40 mg via ORAL
  Filled 2019-09-13 (×3): qty 1

## 2019-09-13 MED ORDER — ACETAMINOPHEN 325 MG PO TABS: 650.0000 mg | ORAL_TABLET | Freq: Four times a day (QID) | ORAL | Status: DC | PRN

## 2019-09-13 MED FILL — Sodium Bicarbonate IV Soln 8.4%: INTRAVENOUS | Qty: 50 | Status: AC

## 2019-09-13 MED FILL — Heparin Sodium (Porcine) Inj 1000 Unit/ML: INTRAMUSCULAR | Qty: 30 | Status: AC

## 2019-09-13 MED FILL — Potassium Chloride Inj 2 mEq/ML: INTRAVENOUS | Qty: 40 | Status: AC

## 2019-09-13 MED FILL — Lidocaine HCl Local Soln Prefilled Syringe 100 MG/5ML (2%): INTRAMUSCULAR | Qty: 5 | Status: AC

## 2019-09-13 MED FILL — Electrolyte-R (PH 7.4) Solution: INTRAVENOUS | Qty: 4000 | Status: AC

## 2019-09-13 MED FILL — Mannitol IV Soln 20%: INTRAVENOUS | Qty: 500 | Status: AC

## 2019-09-13 MED FILL — Magnesium Sulfate Inj 50%: INTRAMUSCULAR | Qty: 10 | Status: AC

## 2019-09-13 MED FILL — Sodium Chloride IV Soln 0.9%: INTRAVENOUS | Qty: 2000 | Status: AC

## 2019-09-13 MED FILL — Heparin Sodium (Porcine) Inj 1000 Unit/ML: INTRAMUSCULAR | Qty: 20 | Status: AC

## 2019-09-13 NOTE — Progress Notes (Signed)
Mobility Specialist: Progress Note    09/13/19 1801  Mobility  Activity Ambulated in hall  Level of Assistance Minimal assist, patient does 75% or more  Assistive Device Front wheel walker  Distance Ambulated (ft) 190 ft  Mobility Response Tolerated well  Mobility performed by Mobility specialist  Bed Position Semi-fowlers  $Mobility charge 1 Mobility   Pre-Mobility: 98 HR Post-Mobility: 89 HR, 151/76 BP, 97% SpO2  Pt had no c/o while ambulating.   [facility] Daviel Allegretto Mobility Specialist

## 2019-09-13 NOTE — Progress Notes (Signed)
Patient to room 4E17 from [place]. Vital signs obtained. Monitor on CCMD notified. CHG bath completed. Alert and oriented to room and call light. Call bell within reach.  Era Bumpers, RN

## 2019-09-13 NOTE — Progress Notes (Signed)
2 Days Post-Op Procedure(s) (LRB): CORONARY ARTERY BYPASS GRAFTING (CABG), ON PUMP, TIMES THREE, USING LEFT INTERNAL MAMMARY ARTERY AND ENDOSCOPICALLY HARVESTED RIGHT GREATER SAPHENOUS VEIN (N/A) TRANSESOPHAGEAL ECHOCARDIOGRAM (TEE) (N/A) Subjective: Sore and weak but ambulated this am. Up in chair. No BM yet.  Objective: Vital signs in last 24 hours: Temp:  [98 F (36.7 C)-98.4 F (36.9 C)] 98.2 F (36.8 C) (08/25 0740) Pulse Rate:  [63-80] 78 (08/25 0600) Cardiac Rhythm: Normal sinus rhythm (08/25 0100) Resp:  [18-29] 24 (08/25 0600) BP: (99-138)/(60-77) 125/72 (08/25 0600) SpO2:  [91 %-96 %] 95 % (08/25 0600) Arterial Line BP: (137-154)/(56-60) 149/56 (08/24 0930) Weight:  [88.2 kg] 88.2 kg (08/25 0500)  Hemodynamic parameters for last 24 hours: PAP: (24-34)/(8-15) 24/8  Intake/Output from previous day: 08/24 0701 - 08/25 0700 In: 503 [P.O.:240; I.V.:13; IV Piggyback:100] Out: 8768 [Urine:3070; Chest Tube:120] Intake/Output this shift: No intake/output data recorded.  General appearance: alert and cooperative Neurologic: intact Heart: regular rate and rhythm, S1, S2 normal, no murmur Lungs: clear to auscultation bilaterally Extremities: no edema Wound: dressings dry  Lab Results: Recent Labs    09/12/19 0405 09/12/19 1713  WBC 9.9 11.8*  HGB 11.9* 11.6*  HCT 35.5* 35.8*  PLT 157 152   BMET:  Recent Labs    09/12/19 0405 09/12/19 1713  NA 137 136  K 4.3 4.2  CL 107 104  CO2 20* 23  GLUCOSE 102* 164*  BUN 15 16  CREATININE 1.17 1.31*  CALCIUM 7.5* 7.8*    PT/INR:  Recent Labs    09/11/19 1311  LABPROT 16.3*  INR 1.4*   ABG    Component Value Date/Time   PHART 7.288 (L) 09/11/2019 2149   HCO3 19.6 (L) 09/11/2019 2149   TCO2 21 (L) 09/11/2019 2149   ACIDBASEDEF 7.0 (H) 09/11/2019 2149   O2SAT 98.0 09/11/2019 2149   CBG (last 3)  Recent Labs    09/11/19 2147 09/12/19 0016 09/12/19 0407  GLUCAP 128* 85  71 99   CXR: small left pleural  effusion, bibasilar atelectasis  Assessment/Plan: S/P Procedure(s) (LRB): CORONARY ARTERY BYPASS GRAFTING (CABG), ON PUMP, TIMES THREE, USING LEFT INTERNAL MAMMARY ARTERY AND ENDOSCOPICALLY HARVESTED RIGHT GREATER SAPHENOUS VEIN (N/A) TRANSESOPHAGEAL ECHOCARDIOGRAM (TEE) (N/A)  POD 2 Hemodynamically stable in sinus rhythm. Continue Lopressor.  Volume excess: diuresed -2700 cc yesterday. Wt is down to admission wt but 6 lbs over preop wt. Will hold off on diuretic today and resume oral tomorrow.   Transfer to 4E and continue IS, ambulation.  Plan to send home on ASA and Plavix due to ACS. Will start Plavix after pacing wires out.   LOS: 7 days    Carlos Davidson 09/13/2019

## 2019-09-13 NOTE — Hospital Course (Addendum)
History of Present Illness:  The patient is a 79 year old gentleman with hypertension and hyperlipidemia who has a several month history of exertional fatigue and had onset of lower substernal chest tightness early yesterday morning that awoke him from sleep.  It was associated with some pain in his throat as well is pain in both elbows.  It persisted all day but resolved with sublingual nitroglycerin.  He initially thought it was due to hiatal hernia and GERD says it seemed to improve with belching and an acids.  Since the pain kept recurring he called EMS.  His initial high-sensitivity troponin was 7879 and a second value was 9991.  He was pain-free on arrival.  Electrocardiogram showed no acute ST changes.  He underwent Cardiac catheterization this morning which showed about 40% distal left main with calcification.  There is 90% ostial LAD stenosis with severe calcification.  There is no obvious lesion left circumflex but TIMI II flow.  There was a moderate size first marginal with a long 90% stenosis.  Right coronary artery was a large vessel with no significant stenosis.  Left ventricular ejection fraction was normal.  LVEDP was normal. After review of clinical data, Dr. Caffie Pinto offered coronary bypass grafting.  Patient elected to proceed with surgery.  Course in Hospital:  Mr. Clason remained stable following left heart catheterization.  On 09/11/2019, he was prepared and taken to the operating room CABG x3 was performed without complication.  He separated from cardiopulmonary bypass without any difficulty.  He was transferred to the cardiovascular ICU where he remained hemodynamically stable.  He was weaned from mechanical ventilatory support by protocol and was extubated by 6:30 PM on the day of surgery.  His cardiac and respiratory status remained satisfactory.  He was mobilized early postoperatively.  He was started on metoprolol on the first postoperative day to assist with blood pressure management.   He was also diuresed aggressively for a 10 pound perioperative weight gain.  He responded well to diuresis.  He was mobilized and made good progress with ambulation.  He was transferred to 4E progressive care.  He remained stable and continued to progress.  He had return of appropriate bowel function.  He responded well to diuresis.  Patient wires were removed on postop day 4.  After this, aspirin dose was reduced to 81 mg p.o. daily and Plavix 75 mg p.o. daily was added due to patient's initial presentation with acute coronary syndrome.  Metoprolol also adjusted for moderate hypertension observed while he was in the hospital.  On postop day 4, we were notified that Mr. Strawderman   son-in-law who has been transporting the patient's wife to the hospital for visits had testing positive for COVID.  A Covid test was ordered on that day for Mr. Marczak and was NEGATIVE.  We also encouraged his wife to be tested promptly.

## 2019-09-14 LAB — BASIC METABOLIC PANEL
Anion gap: 12 (ref 5–15)
BUN: 16 mg/dL (ref 8–23)
CO2: 23 mmol/L (ref 22–32)
Calcium: 7.9 mg/dL — ABNORMAL LOW (ref 8.9–10.3)
Chloride: 103 mmol/L (ref 98–111)
Creatinine, Ser: 1.08 mg/dL (ref 0.61–1.24)
GFR calc Af Amer: >60 mL/min (ref >60)
GFR calc non Af Amer: >60 mL/min (ref >60)
Glucose, Bld: 112 mg/dL — ABNORMAL HIGH (ref 70–99)
Potassium: 3.8 mmol/L (ref 3.5–5.1)
Sodium: 138 mmol/L (ref 135–145)

## 2019-09-14 LAB — TYPE AND SCREEN
ABO/RH(D): A POS
Antibody Screen: NEGATIVE
Unit division: 0
Unit division: 0

## 2019-09-14 LAB — BPAM RBC
Blood Product Expiration Date: 202109072359
Blood Product Expiration Date: 202109072359
ISSUE DATE / TIME: 202108230732
ISSUE DATE / TIME: 202108230732
Unit Type and Rh: 6200
Unit Type and Rh: 6200

## 2019-09-14 NOTE — Progress Notes (Signed)
CARDIAC REHAB PHASE I   PRE:  Rate/Rhythm: 82 SR  BP:  Supine: 145/88  Sitting:   Standing:    SaO2: 95%RA  MODE:  Ambulation: 250 ft   POST:  Rate/Rhythm: 105 ST  BP:  Supine:   Sitting: 143/77  Standing:    SaO2: 96-97%RA 0905-0955 Pt walked 250 ft on RA with rolling walker with steady gait and tolerated well. Reminded pt several times to adhere to sternal precautions, using arms too much. Encouraged him to hold pillow when standing and sitting and coughing. Pt is coughing productively. Gave tissues and extra urinal as he likes to use for mucus. Assisted with voiding and then to recliner. Gave ice water and encouraged pt to sit up as long as he can tolerate. Call bell in reach. Encouraged more walks with Mobility team.    Graylon Good, RN BSN  09/14/2019 9:52 AM

## 2019-09-14 NOTE — Progress Notes (Signed)
Mobility Specialist - Progress Note   09/14/19 1555  Mobility  Activity Ambulated in hall  Level of Assistance Minimal assist, patient does 75% or more  Assistive Device Front wheel walker  Distance Ambulated (ft) 250 ft  Mobility Response Tolerated well  Mobility performed by Mobility specialist  $Mobility charge 1 Mobility    Pre-mobility: 77 HR During mobility: 90 HR Post-mobility: 74 HR  Pt required min assist to get in/out of bed in order to adhere to sternal precautions. He was able to rock-to-stand independently.   Pricilla Handler Mobility Specialist Mobility Specialist Phone: [phone]

## 2019-09-14 NOTE — Plan of Care (Signed)
Continue to monitor

## 2019-09-14 NOTE — Progress Notes (Addendum)
      [address]       [place] [address]             [phone]      3 Days Post-Op Procedure(s) (LRB): CORONARY ARTERY BYPASS GRAFTING (CABG), ON PUMP, TIMES THREE, USING LEFT INTERNAL MAMMARY ARTERY AND ENDOSCOPICALLY HARVESTED RIGHT GREATER SAPHENOUS VEIN (N/A) TRANSESOPHAGEAL ECHOCARDIOGRAM (TEE) (N/A) Subjective: Transferred from ICU yesterday.  Feels like he is progressing. No new concerns. BM yesterday.   Objective: Vital signs in last 24 hours: Temp:  [98.1 F (36.7 C)-99.3 F (37.4 C)] 98.2 F (36.8 C) (08/26 0724) Pulse Rate:  [66-89] 84 (08/26 0724) Cardiac Rhythm: Normal sinus rhythm (08/26 0805) Resp:  [18-20] 18 (08/26 0724) BP: (127-149)/(54-87) 148/87 (08/26 0724) SpO2:  [91 %-96 %] 95 % (08/26 0724) Weight:  [86.2 kg] 86.2 kg (08/26 0253)   Intake/Output from previous day: 08/25 0701 - 08/26 0700 In: -  Out: 180 [Urine:180] Intake/Output this shift: Total I/O In: -  Out: 100 [Urine:100]  General appearance: alert, cooperative and no distress Neurologic: intact Heart: RRR, no significant arrhythmas. Lungs: breath sounds are CTA. Abdomen: soft and NT. Extremities: No peripheral edema.  Wound: Dressings removed. The sternal and EVH incisions are intact and dry.   Lab Results: Recent Labs    09/12/19 1713 09/13/19 0754  WBC 11.8* 12.0*  HGB 11.6* 11.7*  HCT 35.8* 35.2*  PLT 152 162   BMET:  Recent Labs    09/13/19 0754 09/14/19 0330  NA 135 138  K 3.9 3.8  CL 102 103  CO2 23 23  GLUCOSE 193* 112*  BUN 17 16  CREATININE 1.22 1.08  CALCIUM 7.6* 7.9*    PT/INR:  Recent Labs    09/11/19 1311  LABPROT 16.3*  INR 1.4*   ABG    Component Value Date/Time   PHART 7.288 (L) 09/11/2019 2149   HCO3 19.6 (L) 09/11/2019 2149   TCO2 21 (L) 09/11/2019 2149   ACIDBASEDEF 7.0 (H) 09/11/2019 2149   O2SAT 98.0 09/11/2019 2149   CBG (last 3)  Recent Labs    09/11/19 2147 09/12/19 0016 09/12/19 0407  GLUCAP 128* 85   71 99    Assessment/Plan: S/P Procedure(s) (LRB): CORONARY ARTERY BYPASS GRAFTING (CABG), ON PUMP, TIMES THREE, USING LEFT INTERNAL MAMMARY ARTERY AND ENDOSCOPICALLY HARVESTED RIGHT GREATER SAPHENOUS VEIN (N/A) TRANSESOPHAGEAL ECHOCARDIOGRAM (TEE) (N/A)  -POD-3 CABG x 3 after presenting with ACS. Progressing well. Continue ASA, beta blocker, statin. Add clopidogrel after pacer wires removed. Continue PT.  -History of HTN- BP gradually increasing as he recovers. Will increase metoprolol to 25mg  po BID.   -Volume Excess-resolved. Wt about 3 lbs below pre-op. D/C Lasix and KCl after today's doses.   -Dyslipidemia- Continue statin.   -DVT PPX- continue ambulation and enoxaparin.   -Disposition- anticipate discharge to home in 1-2 days.    LOS: 8 days    Antony Odea, Hershal Coria [phone] 09/14/2019   Chart reviewed, patient examined, agree with above. He feels well. Had two BM's today. Ambulating. Rhythm stable in sinus. Plan to send home Saturday.

## 2019-09-15 LAB — SARS CORONAVIRUS 2 BY RT PCR (HOSPITAL ORDER, PERFORMED IN ~~LOC~~ HOSPITAL LAB): SARS Coronavirus 2: NEGATIVE

## 2019-09-15 MED ORDER — CLOPIDOGREL BISULFATE 75 MG PO TABS
75.0000 mg | ORAL_TABLET | Freq: Every day | ORAL | Status: DC
  Administered 2019-09-16: 75 mg via ORAL
  Filled 2019-09-15: qty 1

## 2019-09-15 MED ORDER — ASPIRIN EC 325 MG PO TBEC
325.0000 mg | DELAYED_RELEASE_TABLET | Freq: Every day | ORAL | Status: AC
  Administered 2019-09-15: 325 mg via ORAL
  Filled 2019-09-15: qty 1

## 2019-09-15 MED ORDER — ASPIRIN EC 81 MG PO TBEC
81.0000 mg | DELAYED_RELEASE_TABLET | Freq: Every day | ORAL | Status: DC
  Administered 2019-09-16: 81 mg via ORAL
  Filled 2019-09-15: qty 1

## 2019-09-15 MED ORDER — METOPROLOL TARTRATE 25 MG PO TABS
25.0000 mg | ORAL_TABLET | Freq: Two times a day (BID) | ORAL | Status: DC
  Administered 2019-09-15 – 2019-09-16 (×3): 25 mg via ORAL
  Filled 2019-09-15 (×3): qty 1

## 2019-09-15 NOTE — Discharge Summary (Signed)
[facility] physician Discharge Summary  [name] MRN: [id number] DOB/AGE: [birth year] 79 y.o.  Admit date: 09/06/2019 Discharge date: 09/16/2019  Admission Diagnoses:  NSTEMI (non-ST elevated myocardial infarction) [facility])   ACS (acute coronary syndrome) ([place])   Coronary artery disease involving native heart without angina pectoris   Atherosclerosis of both carotid arteries   Mixed hyperlipidemia   Benign hypertension  Discharge Diagnoses:  Active Problems:   NSTEMI (non-ST elevated myocardial infarction) [facility])   ACS (acute coronary syndrome) ([place])   Coronary artery disease involving native heart without angina pectoris   Atherosclerosis of both carotid arteries   Mixed hyperlipidemia   Benign hypertension   Constipation   S/P CABG x 3   Discharged Condition: stable  History of Present Illness:  The patient is a 79 year old gentleman with hypertension and hyperlipidemia who has a several month history of exertional fatigue and had onset of lower substernal chest tightness early yesterday morning that awoke him from sleep.  It was associated with some pain in his throat as well is pain in both elbows.  It persisted all day but resolved with sublingual nitroglycerin.  He initially thought it was due to hiatal hernia and GERD says it seemed to improve with belching and an acids.  Since the pain kept recurring he called EMS.  His initial high-sensitivity troponin was 7879 and a second value was 9991.  He was pain-free on arrival.  Electrocardiogram showed no acute ST changes.  He underwent Cardiac catheterization this morning which showed about 40% distal left main with calcification.  There is 90% ostial LAD stenosis with severe calcification.  There is no obvious lesion left circumflex but TIMI II flow.  There was a moderate size first marginal with a long 90% stenosis.  Right coronary artery was a large vessel with no significant stenosis.  Left ventricular ejection  fraction was normal.  LVEDP was normal. After review of clinical data, Dr. Caffie Pinto offered coronary bypass grafting.  Patient elected to proceed with surgery.  Course in Hospital:  [name] remained stable following left heart catheterization.  On 09/11/2019, he was prepared and taken to the operating room CABG x3 was performed without complication.  He separated from cardiopulmonary bypass without any difficulty.  He was transferred to the cardiovascular ICU where he remained hemodynamically stable.  He was weaned from mechanical ventilatory support by protocol and was extubated by 6:30 PM on the day of surgery.  His cardiac and respiratory status remained satisfactory.  He was mobilized early postoperatively.  He was started on metoprolol on the first postoperative day to assist with blood pressure management.  He was also diuresed aggressively for a 10 pound perioperative weight gain.  He responded well to diuresis.  He was mobilized and made good progress with ambulation.  He was transferred to 4E progressive care.  He remained stable and continued to progress.  He had return of appropriate bowel function.  He responded well to diuresis.  Patient wires were removed on postop day 4.  After this, aspirin dose was reduced to 81 mg p.o. daily and Plavix 75 mg p.o. daily was added due to patient's initial presentation with acute coronary syndrome.  Metoprolol also adjusted for moderate hypertension observed while he was in the hospital.  On postop day 4, we were notified that [name]   son-in-law who has been transporting the patient's wife to the hospital for visits had testing positive for COVID.  A Covid test was ordered on that  day for [name] and was NEGATIVE. Patient's wife to be tested soon. Patient instructed to wear a mask and distance as much as possible. Patient was hypertensive with SBP in the 140's+ so low dose Amlodipine was started (no ACE as creatinine slightly increased to 1.3). As  discussed with Dr. Servando Snare, patient is felt surgically stable for discharge today.  Consults: None  Significant Diagnostic Studies:    LEFT HEART CATH AND CORONARY ANGIOGRAPHY  Conclusion  LM: Distal 40% stenosis w/severe calcification LAD: Ostial 90% stenosis with severe calcification LCx: No obvious lesion in LCx, but with TIMI II flow        OM1 long 90% stenosis RCA: Prox-mid 20% stenosis  Normal LVEDP Normal LVEF  Sub-optimal landing zone for LAD stent given severe calcification extending into distal LM. Recommend CVTS consult for CABG to LAD and OM1 Continue Aspirin/heparin. Patient has not received P2Y12i.   Nigel Mormon, MD Pager: [phone] Office: [phone]  Coronary Findings  Diagnostic Dominance: Right Left Main  Dist LM lesion is 40% stenosed. The lesion is severely calcified.  Dist LM to Ost LAD lesion is 90% stenosed. The lesion is severely calcified.  Left Circumflex  First Obtuse Marginal Branch  1st Mrg lesion is 90% stenosed.  Right Coronary Artery  Prox RCA to Mid RCA lesion is 20% stenosed.  Intervention  No interventions have been documented. Left Heart  Left Ventricle LV end diastolic pressure is normal.  Coronary Diagrams  Diagnostic Dominance: Right  Intervention     ECHOCARDIOGRAM REPORT       Patient Name:  Carlos Davidson Date of Exam: 09/07/2019  Medical Rec #: [id number]   Height:    71.0 in  Accession #:  [id number]   Weight:    194.0 lb  Date of Birth: 26-Jun-1940    BSA:     2.081 m  Patient Age:  79 years    BP:      131/93 mmHg  Patient Gender: M       HR:      58 bpm.  Exam Location: Inpatient   Procedure: 2D Echo   Indications:   chest pain    History:     Patient has no prior history of Echocardiogram  examinations.          Risk Factors:Hypertension and Dyslipidemia. Dysrhythmia.    Sonographer:   Jannett Celestine RDCS (AE)    Referring Phys: [id number] [facility] PATWARDHAN  Diagnosing Phys: Vernell Leep MD   IMPRESSIONS    1. Left ventricular ejection fraction, by estimation, is 60 to 65%. The  left ventricle has normal function. The left ventricle has no regional  wall motion abnormalities. Left ventricular diastolic parameters were  normal.  2. Right ventricular systolic function is normal. The right ventricular  size is normal.  3. No significant valvular abnormality.  4. Normal right atrial pressure.   FINDINGS  Left Ventricle: Left ventricular ejection fraction, by estimation, is 60  to 65%. The left ventricle has normal function. The left ventricle has no  regional wall motion abnormalities. The left ventricular internal cavity  size was normal in size. There is  no left ventricular hypertrophy. Left ventricular diastolic parameters  were normal.   Right Ventricle: The right ventricular size is normal. No increase in  right ventricular wall thickness. Right ventricular systolic function is  normal.   Left Atrium: Left atrial size was normal in size.   Right Atrium: Right atrial size was normal in size.  Pericardium: There is no evidence of pericardial effusion.   Mitral Valve: The mitral valve is normal in structure. No evidence of  mitral valve regurgitation.   Tricuspid Valve: The tricuspid valve is normal in structure. Tricuspid  valve regurgitation is not demonstrated.   Aortic Valve: The aortic valve is normal in structure. Aortic valve  regurgitation is not visualized.   Pulmonic Valve: The pulmonic valve was normal in structure. Pulmonic valve  regurgitation is not visualized.   Aorta: The aortic root and ascending aorta are structurally normal, with  no evidence of dilitation.   Venous: The inferior vena cava is normal in size with greater than 50%  respiratory variability, suggesting right atrial pressure of 3 mmHg.   IAS/Shunts: No atrial level shunt detected  by color flow Doppler.     LEFT VENTRICLE  PLAX 2D  LVIDd:     4.50 cm Diastology  LVIDs:     2.80 cm LV e' lateral:  8.81 cm/s  LV PW:     1.30 cm LV E/e' lateral: 8.9  LV IVS:    1.10 cm LV e' medial:  6.85 cm/s  LVOT diam:   2.20 cm LV E/e' medial: 11.4  LV SV:     71  LV SV Index:  34  LVOT Area:   3.80 cm     RIGHT VENTRICLE  RV S prime:   10.80 cm/s  TAPSE (M-mode): 1.7 cm   LEFT ATRIUM       Index    RIGHT ATRIUM      Index  LA diam:    3.70 cm 1.78 cm/m RA Area:   11.20 cm  LA Vol (A2C):  37.5 ml 18.02 ml/m RA Volume:  23.30 ml 11.19 ml/m  LA Vol (A4C):  34.8 ml 16.72 ml/m  LA Biplane Vol: 36.4 ml 17.49 ml/m  AORTIC VALVE  LVOT Vmax:  83.60 cm/s  LVOT Vmean: 58.600 cm/s  LVOT VTI:  0.187 m    AORTA  Ao Root diam: 3.50 cm   MITRAL VALVE  MV Area (PHT): 3.03 cm  SHUNTS  MV Decel Time: 250 msec  Systemic VTI: 0.19 m  MV E velocity: 78.00 cm/s Systemic Diam: 2.20 cm  MV A velocity: 65.60 cm/s  MV E/A ratio: 1.19   Manish Patwardhan MD  Electronically signed by Vernell Leep MD  Signature Date/Time: 09/07/2019/10:55:18 AM    Treatments:   CARDIOVASCULAR SURGERY OPERATIVE NOTE  09/11/2019  Surgeon:  Gaye Pollack, MD  First Assistant: Enid Cutter,  PA-C   Preoperative Diagnosis:  Severe multi-vessel coronary artery disease   Postoperative Diagnosis:  Same   Procedure:  1. Median Sternotomy 2. Extracorporeal circulation 3.   Coronary artery bypass grafting x 3   Left internal mammary artery graft to the LAD  SVG to diagonal  SVG to OM  4.   Endoscopic vein harvest from the right leg   Anesthesia:  General Endotracheal   Clinical History/Surgical Indication:  This active 79 year old gentleman has high-grade calcified ostial LAD and high-grade calcified proximal first marginal stenoses with mild distal left main tapering. He  presented with unstable angina and ruled in for non-ST segment elevation MI.I agree that coronary artery bypass graft surgery with a left internal mammary graft to the LAD and a graft to his first marginal is the best treatment for him. PCI and stenting of his calcified ostial LAD stenosis would have increased risk due to the calcified lesion extending back into the left main. I  discussed the operative procedure with the patient,wife and sonincluding alternatives, benefits and risks; including but not limited to bleeding, blood transfusion, infection, stroke, myocardial infarction, graft failure, heart block requiring a permanent pacemaker, organ dysfunction, and death. Wonda Cheng Payneunderstands and agrees to proceed.  Discharge Exam: Blood pressure (!) 152/85, pulse 84, temperature 98.3 F (36.8 C), temperature source Oral, resp. rate (!) 23, height 5\' 11"  (1.803 m), weight 86.4 kg, SpO2 98 %.   Cardiovascular: RRR Pulmonary: Slightly diminished bibasilar breath sounds Abdomen: Soft, non tender, bowel sounds present. Extremities: Trace bilateral lower extremity edema. Wounds: Clean and dry.  No erythema or signs of infection.   Disposition: Discharge disposition: 01-Home or Self Care    Stable and discharged to home.   Allergies as of 09/16/2019      Reactions   Tetracycline Other (See Comments)   Tightness of chest. Per patient was given in combination with another medication. Not sure of name.   Streptomycin Other (See Comments)   Tightness in chest 1970   Sulfonamide Derivatives Rash   Upper arms and stomach      Medication List    STOP taking these medications   predniSONE 10 MG tablet Commonly known as: DELTASONE     TAKE these medications   acetaminophen 325 MG tablet Commonly known as: TYLENOL Take 2 tablets (650 mg total) by mouth every 6 (six) hours as needed for mild pain.   amLODipine 5 MG tablet Commonly known as: NORVASC Take 1 tablet (5 mg total) by  mouth daily. Start taking on: September 17, 2019   aspirin 81 MG EC tablet Take 1 tablet (81 mg total) by mouth daily. Swallow whole.   atorvastatin 80 MG tablet Commonly known as: LIPITOR Take 1 tablet (80 mg total) by mouth daily.   Blink Tears 0.25 % Soln Generic drug: Polyethylene Glycol 400 Apply 1 drop to eye 2 (two) times daily as needed (dry eyes).   clopidogrel 75 MG tablet Commonly known as: PLAVIX Take 1 tablet (75 mg total) by mouth daily.   furosemide 40 MG tablet Commonly known as: Lasix Take 1 tablet (40 mg total) by mouth daily. For 5 days then stop. Start taking on: September 17, 2019   HYDROcodone-acetaminophen 5-325 MG tablet Commonly known as: Norco Take 1 tablet by mouth every 6 (six) hours as needed for severe pain. What changed:   how much to take  reasons to take this   metoprolol tartrate 25 MG tablet Commonly known as: LOPRESSOR Take 1 tablet (25 mg total) by mouth 2 (two) times daily.   potassium chloride SA 20 MEQ tablet Commonly known as: KLOR-CON Take 1 tablet (20 mEq total) by mouth daily. For 5 days then stop. Start taking on: September 17, 2019   sertraline 50 MG tablet Commonly known as: ZOLOFT Take 50 mg by mouth daily with breakfast.   Systane 0.4-0.3 % Gel ophthalmic gel Generic drug: Polyethyl Glycol-Propyl Glycol Place 1 application into both eyes daily as needed (dry eyes).       Follow-up Information    Gaye Pollack, MD. Go on 10/11/2019.   Specialty: Cardiothoracic Surgery Why: Your appointment is on Wednesday 10/11/19 at 12:30. Please arrive 30 minutes early for a chest x-ray to be performed by [facility] located on the first floor of the same building.  Contact information: [address] [place] [address] [phone]        Nigel Mormon, MD. Go on 10/04/2019.  Specialties: Cardiology, Radiology Why: Your appointment is at 11:30am.  Contact information: [place] [address] [phone]        [facility]. Go on 09/21/2019.   Specialty: Cardiothoracic Surgery Why: Your appointment for suture removal is on Thursday 09/21/19 at 10:00am.  Contact information: [place], [place] [phone]             The patient has been discharged on:   1.Beta Blocker:  Yes [ x  ]                              No   [   ]                              If No, reason:  2.Ace Inhibitor/ARB: Yes [   ]                                     No  [  x  ]                                     If No, reason:Slighlty elevated creatinne  3.Statin:   Yes [x   ]                  No  [   ]                  If No, reason:  4.Ecasa:  Yes  [ x  ]                  No   [   ]                  If No, reason:   Signed: Arnoldo Lenis 09/16/2019, 11:24 AM

## 2019-09-15 NOTE — Progress Notes (Signed)
CARDIAC REHAB PHASE I   PRE:  Rate/Rhythm: 82 SR    BP: sitting 111/68    SaO2: 96 RA  MODE:  Ambulation: 320 ft   POST:  Rate/Rhythm: 95 SR    BP: sitting 150/72     SaO2: 96 RA  Pt ambulated with RW, steady, slow pace. No c/o. To bed for rest. Discussed IS, sternal precautions, diet, exercise, and CRPII. Pt not interested in CRPII but will place referral for [place]. Encouraged IS and x2 more walks today. [place], ACSM 09/15/2019 2:33 PM

## 2019-09-15 NOTE — Progress Notes (Signed)
Patient's son in law who has been the patient's wife transportation to and from the hospital has tested positive for COVID today. Patient's wife, [name] and son, Simona Huh concerned. PA notified. New orders received to get COVID test on patient.

## 2019-09-15 NOTE — Plan of Care (Signed)
Continue to monitor

## 2019-09-15 NOTE — Progress Notes (Signed)
Patient stated that area around right side pacing wire "hurts". Small amount of new blood noted about dime size on the bandage. Patient has no other complaints. Up in bed eating breakfast. PA notified.   0254: PA at bedside. Pacing wires removed by PA. Will continue to monitor.

## 2019-09-15 NOTE — Progress Notes (Signed)
Mobility Specialist - Progress Note   09/15/19 1603  Mobility  Activity Ambulated in hall  Level of Assistance Modified independent, requires aide device or extra time  Distance Ambulated (ft) 440 ft  Mobility Response Tolerated well  Mobility performed by Mobility specialist  $Mobility charge 1 Mobility    Pre-mobility: 86 HR, 96% SpO2 During mobility: 98 HR Post-mobility: 86 HR, 94% SpO2  Pt had one brief episode of dizziness while exiting his room, it resolved quickly w/ a standing rest break. He otherwise was asx throughout.   Pricilla Handler Mobility Specialist Mobility Specialist Phone: [phone]

## 2019-09-15 NOTE — Progress Notes (Addendum)
      [address]       [place] [address]             [phone]      4 Days Post-Op Procedure(s) (LRB): CORONARY ARTERY BYPASS GRAFTING (CABG), ON PUMP, TIMES THREE, USING LEFT INTERNAL MAMMARY ARTERY AND ENDOSCOPICALLY HARVESTED RIGHT GREATER SAPHENOUS VEIN (N/A) TRANSESOPHAGEAL ECHOCARDIOGRAM (TEE) (N/A) Subjective:  Had a good day yesterday. Doing well with ambulation.  No new concerns.   Objective: Vital signs in last 24 hours: Temp:  [98.1 F (36.7 C)-99.3 F (37.4 C)] 98.5 F (36.9 C) (08/27 0542) Pulse Rate:  [77-88] 81 (08/27 0542) Cardiac Rhythm: Normal sinus rhythm (08/27 0808) Resp:  [17-20] 20 (08/27 0542) BP: (122-152)/(75-91) 150/79 (08/27 0542) SpO2:  [94 %-96 %] 95 % (08/27 0542)   Intake/Output from previous day: 08/26 0701 - 08/27 0700 In: 0  Out: 900 [Urine:900] Intake/Output this shift: No intake/output data recorded.  General appearance: alert, cooperative and no distress Neurologic: intact Heart:  SR with occasional PVC's Lungs: breath sounds are CTA. Abdomen: soft and NT. Extremities: No peripheral edema.  Wound: The sternal and EVH incisions are intact and dry. Has some erythema along edges of the EVH incision.   Lab Results: Recent Labs    09/12/19 1713 09/13/19 0754  WBC 11.8* 12.0*  HGB 11.6* 11.7*  HCT 35.8* 35.2*  PLT 152 162   BMET:  Recent Labs    09/13/19 0754 09/14/19 0330  NA 135 138  K 3.9 3.8  CL 102 103  CO2 23 23  GLUCOSE 193* 112*  BUN 17 16  CREATININE 1.22 1.08  CALCIUM 7.6* 7.9*    PT/INR:  No results for input(s): LABPROT, INR in the last 72 hours. ABG    Component Value Date/Time   PHART 7.288 (L) 09/11/2019 2149   HCO3 19.6 (L) 09/11/2019 2149   TCO2 21 (L) 09/11/2019 2149   ACIDBASEDEF 7.0 (H) 09/11/2019 2149   O2SAT 98.0 09/11/2019 2149   CBG (last 3)  No results for input(s): GLUCAP in the last 72 hours.  Assessment/Plan: S/P Procedure(s) (LRB): CORONARY ARTERY BYPASS  GRAFTING (CABG), ON PUMP, TIMES THREE, USING LEFT INTERNAL MAMMARY ARTERY AND ENDOSCOPICALLY HARVESTED RIGHT GREATER SAPHENOUS VEIN (N/A) TRANSESOPHAGEAL ECHOCARDIOGRAM (TEE) (N/A)  -POD-4 CABG x 3 after presenting with ACS. Progressing well. Remove pacer wires today, add clopidogrel and decrease ASA to 81mg  daily.  Continue PT.  -History of HTN- Was not on any antihypertensives prior to admission.  Metoprolol increased to 25mg  po BID.   -Volume Excess-resolved. Lasix discontinued.   -Dyslipidemia- Continue statin.   -DVT PPX- continue ambulation and enoxaparin.   -Disposition- anticipate discharge to home tomorrow.   LOS: 9 days    Malon Kindle [phone] 09/15/2019    Chart reviewed, patient examined, agree with above. He looks good and can go home in am if no changes.

## 2019-09-16 ENCOUNTER — Inpatient Hospital Stay (HOSPITAL_COMMUNITY): Payer: Medicare HMO

## 2019-09-16 ENCOUNTER — Other Ambulatory Visit: Payer: Self-pay | Admitting: Cardiothoracic Surgery

## 2019-09-16 LAB — BASIC METABOLIC PANEL
Anion gap: 10 (ref 5–15)
BUN: 21 mg/dL (ref 8–23)
CO2: 27 mmol/L (ref 22–32)
Calcium: 8.2 mg/dL — ABNORMAL LOW (ref 8.9–10.3)
Chloride: 103 mmol/L (ref 98–111)
Creatinine, Ser: 1.3 mg/dL — ABNORMAL HIGH (ref 0.61–1.24)
GFR calc Af Amer: >60 mL/min (ref >60)
GFR calc non Af Amer: 53 mL/min — ABNORMAL LOW (ref >60)
Glucose, Bld: 100 mg/dL — ABNORMAL HIGH (ref 70–99)
Potassium: 3.5 mmol/L (ref 3.5–5.1)
Sodium: 140 mmol/L (ref 135–145)

## 2019-09-16 MED ORDER — ACETAMINOPHEN 325 MG PO TABS: 650.0000 mg | ORAL_TABLET | Freq: Four times a day (QID) | ORAL | Status: DC | PRN

## 2019-09-16 MED ORDER — CLOPIDOGREL BISULFATE 75 MG PO TABS
75.0000 mg | ORAL_TABLET | Freq: Every day | ORAL | 2 refills | Status: DC
Start: 2019-09-16 — End: 2019-10-04

## 2019-09-16 MED ORDER — FUROSEMIDE 40 MG PO TABS: 40.0000 mg | ORAL_TABLET | Freq: Every day | ORAL | 0 refills | Status: DC

## 2019-09-16 MED ORDER — HYDROCODONE-ACETAMINOPHEN 5-325 MG PO TABS: 1.0000 | ORAL_TABLET | Freq: Four times a day (QID) | ORAL | 0 refills | Status: DC | PRN

## 2019-09-16 MED ORDER — CLOPIDOGREL BISULFATE 75 MG PO TABS
75.0000 mg | ORAL_TABLET | Freq: Every day | ORAL | 2 refills | Status: DC
Start: 2019-09-16 — End: 2019-09-16

## 2019-09-16 MED ORDER — AMLODIPINE BESYLATE 5 MG PO TABS
5.0000 mg | ORAL_TABLET | Freq: Every day | ORAL | 0 refills | Status: DC
Start: 2019-09-17 — End: 2019-09-16

## 2019-09-16 MED ORDER — AMLODIPINE BESYLATE 5 MG PO TABS
5.0000 mg | ORAL_TABLET | Freq: Every day | ORAL | Status: DC
  Administered 2019-09-16: 5 mg via ORAL
  Filled 2019-09-16: qty 1

## 2019-09-16 MED ORDER — METOPROLOL TARTRATE 25 MG PO TABS: 25.0000 mg | ORAL_TABLET | Freq: Two times a day (BID) | ORAL | 2 refills | Status: DC

## 2019-09-16 MED ORDER — AMLODIPINE BESYLATE 5 MG PO TABS
5.0000 mg | ORAL_TABLET | Freq: Every day | ORAL | 0 refills | Status: DC
Start: 2019-09-17 — End: 2019-10-04

## 2019-09-16 MED ORDER — ATORVASTATIN CALCIUM 80 MG PO TABS
80.0000 mg | ORAL_TABLET | Freq: Every day | ORAL | 2 refills | Status: DC
Start: 2019-09-16 — End: 2019-10-04

## 2019-09-16 MED ORDER — ATORVASTATIN CALCIUM 80 MG PO TABS
80.0000 mg | ORAL_TABLET | Freq: Every day | ORAL | 2 refills | Status: DC
Start: 2019-09-16 — End: 2019-09-16

## 2019-09-16 MED ORDER — POTASSIUM CHLORIDE CRYS ER 20 MEQ PO TBCR: 20.0000 meq | EXTENDED_RELEASE_TABLET | Freq: Every day | ORAL | 0 refills | Status: DC

## 2019-09-16 MED ORDER — METOPROLOL TARTRATE 25 MG PO TABS
25.0000 mg | ORAL_TABLET | Freq: Two times a day (BID) | ORAL | 2 refills | Status: DC
Start: 2019-09-16 — End: 2019-10-04

## 2019-09-16 MED ORDER — POTASSIUM CHLORIDE CRYS ER 20 MEQ PO TBCR
30.0000 meq | EXTENDED_RELEASE_TABLET | Freq: Two times a day (BID) | ORAL | Status: DC
  Administered 2019-09-16: 30 meq via ORAL
  Filled 2019-09-16: qty 1

## 2019-09-16 MED ORDER — POTASSIUM CHLORIDE CRYS ER 20 MEQ PO TBCR
20.0000 meq | EXTENDED_RELEASE_TABLET | Freq: Every day | ORAL | 0 refills | Status: DC
Start: 2019-09-17 — End: 2019-10-04

## 2019-09-16 MED ORDER — FUROSEMIDE 40 MG PO TABS
40.0000 mg | ORAL_TABLET | Freq: Every day | ORAL | 0 refills | Status: DC
Start: 2019-09-17 — End: 2019-10-04

## 2019-09-16 MED ORDER — ASPIRIN 81 MG PO TBEC
81.0000 mg | DELAYED_RELEASE_TABLET | Freq: Every day | ORAL | 11 refills | Status: DC
Start: 2019-09-16 — End: 2019-10-04

## 2019-09-16 NOTE — Progress Notes (Addendum)
      [address]       [place] [address]             [phone]        5 Days Post-Op Procedure(s) (LRB): CORONARY ARTERY BYPASS GRAFTING (CABG), ON PUMP, TIMES THREE, USING LEFT INTERNAL MAMMARY ARTERY AND ENDOSCOPICALLY HARVESTED RIGHT GREATER SAPHENOUS VEIN (N/A) TRANSESOPHAGEAL ECHOCARDIOGRAM (TEE) (N/A)  Subjective: Patient trying to order breakfast this am. He has no specific complaint.  Objective: Vital signs in last 24 hours: Temp:  [98.1 F (36.7 C)-99.4 F (37.4 C)] 98.7 F (37.1 C) (08/28 0451) Pulse Rate:  [80-96] 84 (08/28 0451) Cardiac Rhythm: Normal sinus rhythm (08/27 2100) Resp:  [20] 20 (08/28 0451) BP: (130-157)/(76-86) 143/83 (08/28 0451) SpO2:  [92 %-98 %] 98 % (08/28 0451) Weight:  [86.4 kg] 86.4 kg (08/28 0451)  Pre op weight 85.2 kg Current Weight  09/16/19 86.4 kg       Intake/Output from previous day: 08/27 0701 - 08/28 0700 In: 240 [P.O.:240] Out: 300 [Urine:300]   Physical Exam:  Cardiovascular: RRR Pulmonary: Slightly diminished bibasilar breath sounds Abdomen: Soft, non tender, bowel sounds present. Extremities: Trace bilateral lower extremity edema. Wounds: Clean and dry.  No erythema or signs of infection.  Lab Results: CBC: Recent Labs    09/13/19 0754  WBC 12.0*  HGB 11.7*  HCT 35.2*  PLT 162   BMET:  Recent Labs    09/14/19 0330 09/16/19 0201  NA 138 140  K 3.8 3.5  CL 103 103  CO2 23 27  GLUCOSE 112* 100*  BUN 16 21  CREATININE 1.08 1.30*  CALCIUM 7.9* 8.2*    PT/INR:  Lab Results  Component Value Date   INR 1.4 (H) 09/11/2019   INR 1.2 09/10/2019   ABG:  INR: Will add last result for INR, ABG once components are confirmed Will add last 4 CBG results once components are confirmed  Assessment/Plan:  1. CV - SR with HR in the 80's. On Plavix 75 mg daily, baby ec asa, Lopressor 25 mg bid. He is more hypertensive of late (SP 140's +) so will start Amlodipine. Will not start  ACE/ARB secondary to slightly elevated creatinine. 2.  Pulmonary - On room air. CXR this am appears stable (? Trace left apical pneumothorax, cardiomegaly, small b/l pleural effusions). Encourage incentive spirometer 3. Volume Overload - He was given IV Lasix 08/24 bid and then orally 08/25. With increased creatinine, will not diurese this am 4.  Expected post op acute blood loss anemia - Last H and H 11.7 and 35.2 5. Family member positive for COVID who brings patient's wife to visit. Patient NEGATIVE for COVID as of yesterday. Patient's wife to be tested soon. Patient instructed to wear a mask and distance as much as possible. 6. Supplement potassium 7. Creatinine this am slightly increased to 1.3. He is not on ACE;likley related to diuresis 8. As discussed with Dr. Servando Snare, discharge  Jaziel Bennett M ZimmermanPA-C 09/16/2019,7:36 AM

## 2019-09-16 NOTE — Progress Notes (Signed)
Mobility Specialist - Progress Note   09/16/19 1059  Mobility  Activity Ambulated in hall  Level of Assistance Modified independent, requires aide device or extra time  Assistive Device Front wheel walker  Distance Ambulated (ft) 280 ft  Mobility Response Tolerated well  Mobility performed by Mobility specialist  $Mobility charge 1 Mobility    Pre-mobility: 79 HR During mobility: 93 HR Post-mobility: 87 HR  Pt endorsed minor dizziness when standing up, but said it quickly resolved. He says he felt tired today as it has been difficult for him to sleep.    Pricilla Handler Mobility Specialist Mobility Specialist Phone: [phone]

## 2019-09-16 NOTE — Discharge Instructions (Signed)
Discharge Instructions:  1. You may shower, please wash incisions daily with soap and water and keep dry.  If you wish to cover wounds with dressing you may do so but please keep clean and change daily.  No tub baths or swimming until incisions have completely healed.  If your incisions become red or develop any drainage please call our office at [phone]  2. No Driving until cleared by Dr. Vivi Martens office and you are no longer using narcotic pain medications  3. Monitor your weight daily.. Please use the same scale and weigh at same time... If you gain 5-10 lbs in 48 hours with associated lower extremity swelling, please contact our office at [phone]  4. Fever of 101.5 for at least 24 hours with no source, please contact our office at [phone]  5. Activity- up as tolerated, please walk at least 3 times per day.  Avoid strenuous activity, no lifting, pushing, or pulling with your arms over 8-10 lbs for a minimum of 6 weeks  6. If any questions or concerns arise, please do not hesitate to contact our office at [place]   A heart-healthy diet is recommended to manage heart disease. To follow a heart-healthy diet, . Eat a balanced diet with whole grains, fruits and vegetables, and lean protein sources.  . Choose heart-healthy unsaturated fats. Limit saturated fats, trans fats, and cholesterol intake. Eat more plant-based or vegetarian meals using beans and soy foods for protein.  . Eat whole, unprocessed foods to limit the amount of sodium (salt) you eat.  . Choose a consistent amount of carbohydrate at each meal and snack. Limit refined carbohydrates especially sugar, sweets and sugar-sweetened beverages.  . If you drink alcohol, do so in moderation: one serving per day (women) and two servings per day (men). o One serving is equivalent to 12 ounces beer, 5 ounces wine, or 1.5 ounces distilled spirits  Tips Tips for Choosing Heart-Healthy  Fats Choose lean protein and low-fat dairy foods to reduce saturated fat intake. . Saturated fat is usually found in animal-based protein and is associated with certain health risks. Saturated fat is the biggest contributor to raise low-density lipoprotein (LDL) cholesterol levels. Research shows that limiting saturated fat lowers unhealthy cholesterol levels. Eat no more than 7% of your total calories each day from saturated fat. Ask your RDN to help you determine how much saturated fat is right for you. . There are many foods that do not contain large amounts of saturated fats. Swapping these foods to replace foods high in saturated fats will help you limit the saturated fat you eat and improve your cholesterol levels. You can also try eating more plant-based or vegetarian meals. Instead of. Try:  Whole milk, cheese, yogurt, and ice cream 1% or skim milk, low-fat cheese, non-fat yogurt, and low-fat ice cream  Fatty, marbled beef and pork Lean beef, pork, or venison  Poultry with skin Poultry without skin  Butter, stick margarine Reduced-fat, whipped, or liquid spreads  Coconut oil, palm oil Liquid vegetable oils: corn, canola, olive, soybean and safflower oils   Avoid foods that contain trans fats. . Trans fats increase levels of LDL-cholesterol. Hydrogenated fat in processed foods is the main source of trans fats in foods.  . Trans fats can be found in stick margarine, shortening, processed sweets, baked goods, some fried foods, and packaged foods made with hydrogenated oils. Avoid foods with "partially hydrogenated oil" on the ingredient list such as: cookies, pastries, baked goods,  biscuits, crackers, microwave popcorn, and frozen dinners. Choose foods with heart healthy fats. . Polyunsaturated and monounsaturated fat are unsaturated fats that may help lower your blood cholesterol level when used in place of saturated fat in your diet. . Ask your RDN about taking a dietary supplement with plant  sterols and stanols to help lower your cholesterol level. Marland Kitchen Research shows that substituting saturated fats with unsaturated fats is beneficial to cholesterol levels. Try these easy swaps: Instead of. Try:  Butter, stick margarine, or solid shortening Reduced-fat, whipped, or liquid spreads  Beef, pork, or poultry with skin Fish and seafood  Chips, crackers, snack foods Raw or unsalted nuts and seeds or nut butters Hummus with vegetables Avocado on toast  Coconut oil, palm oil Liquid vegetable oils: corn, canola, olive, soybean and safflower oils  Limit the amount of cholesterol you eat to less than 200 milligrams per day. . Cholesterol is a substance carried through the bloodstream via lipoproteins, which are known as "transporters" of fat. Some body functions need cholesterol to work properly, but too much cholesterol in the bloodstream can damage arteries and build up blood vessel linings (which can lead to heart attack and stroke). You should eat less than 200 milligrams cholesterol per day. Marland Kitchen People respond differently to eating cholesterol. There is no test available right now that can figure out which people will respond more to dietary cholesterol and which will respond less. For individuals with high intake of dietary cholesterol, different types of increase (none, small, moderate, large) in LDL-cholesterol levels are all possible.  . Food sources of cholesterol include egg yolks and organ meats such as liver, gizzards. Limit egg yolks to two to four per week and avoid organ meats like liver and gizzards to control cholesterol intake. Tips for Choosing Heart-Healthy Carbohydrates: . Grains (breads, crackers, rice, pasta, and cereals)  . Starchy Vegetables (potatoes, corn, and peas)  . Beans and legumes  . Milk, soy milk, and yogurt  . Fruit and fruit juice  . Sweets (cakes, cookies, ice cream, jam and jelly) . Choose foods rich in viscous (soluble) fiber . Viscous, or soluble, is found  in the walls of plant cells. Viscous fiber is found only in plant-based foods. Eating foods with fiber helps to lower your unhealthy cholesterol and keep your blood glucose in range  . Rich sources of viscous fiber include vegetables (asparagus, Brussels sprouts, sweet potatoes, turnips) fruit (apricots, mangoes, oranges), legumes, and whole grains (barley, oats, and oat bran).  . As you increase your fiber intake gradually, also increase the amount of water you drink. This will help prevent constipation.  . If you have difficulty achieving this goal, ask your RDN about fiber laxatives. Choose fiber supplements made with viscous fibers such as psyllium seed husks or methylcellulose to help lower unhealthy cholesterol.  . Limit refined carbohydrates  . There are three types of carbohydrates: starches, sugar, and fiber. Some carbohydrates occur naturally in food, like the starches in rice or corn or the sugars in fruits and milk. Refined carbohydrates--foods with high amounts of simple sugars--can raise triglyceride levels. High triglyceride levels are associated with coronary heart disease. . Some examples of refined carbohydrate foods are table sugar, sweets, and beverages sweetened with added sugar. Tips for Reducing Sodium (Salt) Although sodium is important for your body to function, too much sodium can be harmful for people with high blood pressure. As sodium and fluid buildup in your tissues and bloodstream, your blood pressure increases. High blood pressure  may cause damage to other organs and increase your risk for a stroke. Even if you take a pill for blood pressure or a water pill (diuretic) to remove fluid, it is still important to have less salt in your diet. Ask your doctor and RDN what amount of sodium is right for you. Marland Kitchen Avoid processed foods. Eat more fresh foods.  . Fresh fruits and vegetables are naturally low in sodium, as well as frozen vegetables and fruits that have no added juices or  sauces.  . Fresh meats are lower in sodium than processed meats, such as bacon, sausage, and hotdogs. Read the nutrition label or ask your butcher to help you find a fresh meat that is low in sodium. . Eat less salt--at the table and when cooking.  . A single teaspoon of table salt has 2,300 mg of sodium.  . Leave the salt out of recipes for pasta, casseroles, and soups.  . Ask your RDN how to cook your favorite recipes without sodium . Be a Paramedic.  . Look for food packages that say "salt-free" or "sodium-free." These items contain less than 5 milligrams of sodium per serving.  Marland Kitchen "Very low-sodium" products contain less than 35 milligrams of sodium per serving.  Marland Kitchen "Low-sodium" products contain less than 140 milligrams of sodium per serving.  . Beware for "Unsalted" or "No Added Salt" products. These items may still be high in sodium. Check the nutrition label. . Add flavors to your food without adding sodium.  . Try lemon juice, lime juice, fruit juice or vinegar.  . Dry or fresh herbs add flavor. Try basil, bay leaf, dill, rosemary, parsley, sage, dry mustard, nutmeg, thyme, and paprika.  . Pepper, red pepper flakes, and cayenne pepper can add spice t your meals without adding sodium. Hot sauce contains sodium, but if you use just a drop or two, it will not add up to much.  Sharyn Lull a sodium-free seasoning blend or make your own at home. Additional Lifestyle Tips Achieve and maintain a healthy weight. . Talk with your RDN or your doctor about what is a healthy weight for you. . Set goals to reach and maintain that weight.  . To lose weight, reduce your calorie intake along with increasing your physical activity. A weight loss of 10 to 15 pounds could reduce LDL-cholesterol by 5 milligrams per deciliter. Participate in physical activity. . Talk with your health care team to find out what types of physical activity are best for you. Set a plan to get about 30 minutes of exercise on most  days.  Foods Recommended Food Group Foods Recommended  Grains Whole grain breads and cereals, including whole wheat, barley, rye, buckwheat, corn, teff, quinoa, millet, amaranth, brown or wild rice, sorghum, and oats Pasta, especially whole wheat or other whole grain types  [employer], quinoa or wild rice Whole grain crackers, bread, rolls, pitas Home-made bread with reduced-sodium baking soda  Protein Foods Lean cuts of beef and pork (loin, leg, round, extra lean hamburger)  Skinless Cytogeneticist and other wild game Dried beans and peas Nuts and nut butters Meat alternatives made with soy or textured vegetable protein  Egg whites or egg substitute Cold cuts made with lean meat or soy protein  Dairy Nonfat (skim), low-fat, or 1%-fat milk  Nonfat or low-fat yogurt or cottage cheese Fat-free and low-fat cheese  Vegetables Fresh, frozen, or canned vegetables without added fat or salt   Fruits Fresh, frozen, canned, or dried  fruit   Oils Unsaturated oils (corn, olive, peanut, soy, sunflower, canola)  Soft or liquid margarines and vegetable oil spreads  Salad dressings Seeds and nuts  Avocado   Foods Not Recommended Food Group Foods Not Recommended  Grains Breads or crackers topped with salt Cereals (hot or cold) with more than 300 mg sodium per serving Biscuits, cornbread, and other "quick" breads prepared with baking soda Bread crumbs or stuffing mix from a store High-fat bakery products, such as doughnuts, biscuits, croissants, danish pastries, pies, cookies Instant cooking foods to which you add hot water and stir--potatoes, noodles, rice, etc. Packaged starchy foods--seasoned noodle or rice dishes, stuffing mix, macaroni and cheese dinner Snacks made with partially hydrogenated oils, including chips, cheese puffs, snack mixes, regular crackers, butter-flavored popcorn  Protein Foods Higher-fat cuts of meats (ribs, t-bone steak, regular hamburger) Bacon, sausage, or hot  dogs Cold cuts, such as salami or bologna, deli meats, cured meats, corned beef Organ meats (liver, brains, gizzards, sweetbreads) Poultry with skin Fried or smoked meat, poultry, and fish Whole eggs and egg yolks (more than 2-4 per week) Salted legumes, nuts, seeds, or nut/seed butters Meat alternatives with high levels of sodium (>300 mg per serving) or saturated fat (>5 g per serving)  Dairy Whole milk,?2% fat milk, buttermilk Whole milk yogurt or ice cream Cream Half-&-half Cream cheese Sour cream Cheese  Vegetables Canned or frozen vegetables with salt, fresh vegetables prepared with salt, butter, cheese, or cream sauce Fried vegetables Pickled vegetables such as olives, pickles, or sauerkraut  Fruits Fried fruits Fruits served with butter or cream  Oils Butter, stick margarine, shortening Partially hydrogenated oils or trans fats Tropical oils (coconut, palm, palm kernel oils)  Other Candy, sugar sweetened soft drinks and desserts Salt, sea salt, garlic salt, and seasoning mixes containing salt Bouillon cubes Ketchup, barbecue sauce, Worcestershire sauce, soy sauce, teriyaki sauce Miso Salsa Pickles, olives, relish

## 2019-09-16 NOTE — Progress Notes (Signed)
Nutrition Education Note  RD consulted for nutrition education regarding a Heart Healthy diet.   Lipid Panel     Component Value Date/Time   CHOL 171 09/07/2019 0428   TRIG 49 09/07/2019 0428   HDL 50 09/07/2019 0428   CHOLHDL 3.4 09/07/2019 0428   VLDL 10 09/07/2019 0428   LDLCALC 111 (H) 09/07/2019 0428    RD provided "Heart Healthy Nutrition Therapy" handout from the Academy of [employer]. Reviewed patient's dietary recall. Provided examples on ways to decrease sodium and fat intake in diet. Discouraged intake of processed foods and use of salt shaker. Encouraged fresh fruits and vegetables as well as whole grain sources of carbohydrates to maximize fiber intake. Teach back method used.  Labs and medications reviewed. No further nutrition interventions warranted at this time. RD contact information provided. If additional nutrition issues arise, please re-consult RD.  Carlos Davidson RD, LDN Clinical Nutrition Pager listed in [place]

## 2019-09-16 NOTE — Progress Notes (Signed)
CARDIAC REHAB PHASE I   Offered to walk with pt, pt with recent ambulation with mobility tech. Reinforced d/c education. Encouraged continued IS use, walks, and sternal precautions. Pt referred to [facility] [place]. For d/c today.  [phone] Rufina Falco, RN BSN 09/16/2019 11:47 AM

## 2019-09-16 NOTE — Progress Notes (Signed)
Patient did not get to drug store in time , asked they be moved to [facility]

## 2019-09-21 ENCOUNTER — Encounter (HOSPITAL_COMMUNITY): Payer: Self-pay | Admitting: Surgery

## 2019-09-29 LAB — ECHO INTRAOPERATIVE TEE
AV Mean grad: 2 mmHg
Ao-asc: 3.2 cm
Height: 71 in
LVOT diameter: 21 mm
Mean grad: 0 mmHg
STJ: 2.5 cm
Sinus: 3.6 cm
Weight: 3004.8 oz

## 2019-10-04 ENCOUNTER — Other Ambulatory Visit: Payer: Self-pay

## 2019-10-04 ENCOUNTER — Encounter: Payer: Self-pay | Admitting: Cardiology

## 2019-10-04 ENCOUNTER — Ambulatory Visit: Payer: Medicare HMO | Admitting: Cardiology

## 2019-10-04 VITALS — BP 117/67 | HR 62 | Ht 71.0 in | Wt 178.0 lb

## 2019-10-04 DIAGNOSIS — E782 Mixed hyperlipidemia: Secondary | ICD-10-CM

## 2019-10-04 DIAGNOSIS — I2510 Atherosclerotic heart disease of native coronary artery without angina pectoris: Secondary | ICD-10-CM | POA: Diagnosis not present

## 2019-10-04 MED ORDER — ATORVASTATIN CALCIUM 80 MG PO TABS
80.0000 mg | ORAL_TABLET | Freq: Every day | ORAL | 3 refills | Status: DC
Start: 2019-10-04 — End: 2020-01-10

## 2019-10-04 MED ORDER — CLOPIDOGREL BISULFATE 75 MG PO TABS
75.0000 mg | ORAL_TABLET | Freq: Every day | ORAL | 3 refills | Status: DC
Start: 2019-10-04 — End: 2020-07-26

## 2019-10-04 MED ORDER — ASPIRIN 81 MG PO TBEC
81.0000 mg | DELAYED_RELEASE_TABLET | Freq: Every day | ORAL | 3 refills | Status: DC
Start: 2019-10-04 — End: 2020-01-10

## 2019-10-04 MED ORDER — METOPROLOL SUCCINATE ER 25 MG PO TB24: 50.0000 mg | ORAL_TABLET | Freq: Every day | ORAL | 3 refills | Status: DC

## 2019-10-04 MED ORDER — AMLODIPINE BESYLATE 5 MG PO TABS
5.0000 mg | ORAL_TABLET | Freq: Every day | ORAL | 3 refills | Status: DC
Start: 2019-10-04 — End: 2020-01-10

## 2019-10-04 NOTE — Progress Notes (Signed)
Follow up visit  Subjective:   Carlos Davidson, male    DOB: 06/24/1940, 79 y.o.   MRN: [id number]   HPI  Chief Complaint  Patient presents with  . Coronary Artery Disease  . Hypertension  . Follow-up    79 y/o Caucasian male with hypertension, hyperlipidemia, NSTEMI 08/2019, treated with CABGX3 (LIMA-LAD, SVG-Diag, SVG-OM)  Patient is doing well. He denies chest pain, shortness of breath, palpitations, leg edema, orthopnea, PND, TIA/syncope. He has walked up to half a mile without any difficulty. His blood pressure has been in low 100s/60s without any presyncope/syncope. He notices generalized fatigue.   Current Outpatient Medications on File Prior to Visit  Medication Sig Dispense Refill  . acetaminophen (TYLENOL) 325 MG tablet Take 2 tablets (650 mg total) by mouth every 6 (six) hours as needed for mild pain.    Marland Kitchen amLODipine (NORVASC) 5 MG tablet Take 1 tablet (5 mg total) by mouth daily. 30 tablet 0  . aspirin EC 81 MG EC tablet Take 1 tablet (81 mg total) by mouth daily. Swallow whole. 30 tablet 11  . atorvastatin (LIPITOR) 80 MG tablet Take 1 tablet (80 mg total) by mouth daily. 30 tablet 2  . clopidogrel (PLAVIX) 75 MG tablet Take 1 tablet (75 mg total) by mouth daily. 30 tablet 2  . furosemide (LASIX) 40 MG tablet Take 1 tablet (40 mg total) by mouth daily. For 5 days then stop. 5 tablet 0  . HYDROcodone-acetaminophen (NORCO) 5-325 MG tablet Take 1 tablet by mouth every 6 (six) hours as needed for severe pain. 28 tablet 0  . metoprolol tartrate (LOPRESSOR) 25 MG tablet Take 1 tablet (25 mg total) by mouth 2 (two) times daily. 60 tablet 2  . Polyethyl Glycol-Propyl Glycol (SYSTANE) 0.4-0.3 % GEL ophthalmic gel Place 1 application into both eyes daily as needed (dry eyes).    . Polyethylene Glycol 400 (BLINK TEARS) 0.25 % SOLN Apply 1 drop to eye 2 (two) times daily as needed (dry eyes). (Patient not taking: Reported on 09/06/2019)    . potassium chloride SA (KLOR-CON) 20 MEQ  tablet Take 1 tablet (20 mEq total) by mouth daily. For 5 days then stop. 5 tablet 0  . sertraline (ZOLOFT) 50 MG tablet Take 50 mg by mouth daily with breakfast.      No current facility-administered medications on file prior to visit.    Cardiovascular & other pertient studies:  EKG 10/04/2019: Sinus rhythm 63 bpm Old anterolateral infarct  Op note 09/11/2019: CABGX3 (LIMA-LAD, SVG-Diag, SVG-OM) by Dr. Cyndia Bent  Coronary angiogram 09/07/2019: LM: Distal 40% stenosis w/severe calcification LAD: Ostial 90% stenosis with severe calcification LCx: No obvious lesion in LCx, but with TIMI II flow        OM1 long 90% stenosis RCA: Prox-mid 20% stenosis  Normal LVEDP Normal LVEF  Sub-optimal landing zone for LAD stent given severe calcification extending into distal LM. Recommend CVTS consult for CABG to LAD and OM1  Echocardiogram 09/07/2019: 1. Left ventricular ejection fraction, by estimation, is 60 to 65%. The  left ventricle has normal function. The left ventricle has no regional  wall motion abnormalities. Left ventricular diastolic parameters were  normal.  2. Right ventricular systolic function is normal. The right ventricular  size is normal.  3. No significant valvular abnormality.  4. Normal right atrial pressure.    Recent labs: 09/16/2019: Glucose 100, BUN/Cr 21/1.30. EGFR 53. Na/K 140/3.5.  H/H 1.7/35.2. MCV 93. Platelets 162 HbA1C 5.7% Chol 171, TG 49,  HDL 50, LDL 111    Review of Systems  Constitutional: Positive for malaise/fatigue.  Cardiovascular: Negative for chest pain, dyspnea on exertion, leg swelling, palpitations and syncope.         Vitals:   10/04/19 1121  BP: 117/67  Pulse: 62     Body mass index is 24.83 kg/m. Filed Weights   10/04/19 1121  Weight: 178 lb (80.7 kg)     Objective:   Physical Exam Vitals and nursing note reviewed.  Constitutional:      General: He is not in acute distress. Neck:     Vascular: No JVD.   Cardiovascular:     Rate and Rhythm: Normal rate and regular rhythm.     Heart sounds: Normal heart sounds. No murmur heard.   Pulmonary:     Effort: Pulmonary effort is normal.     Breath sounds: Normal breath sounds. No wheezing or rales.           Assessment & Recommendations:   79 y/o Caucasian male with hypertension, hyperlipidemia, NSTEMI 08/2019, treated with CABGX3 (LIMA-LAD, SVG-Diag, SVG-OM)  CAD: S/p CABG. No angina symptoms.  Given initial presentation with ACS prior to CABG, recommend Aspirin/plavix for 1 year (till 08/2020) Continue amlodipine 5 mg, Reduce metoprolol tartarate 25 bid, to succinate 25 mg daily-given his fatigue symptoms. Continue lipitor 80 mg. Repeat lipid panel in 3 months Recommend cardiac rehab, once okay by Dr. Cyndia Bent.   F/u in 3 months after lipid panel.    Nigel Mormon, MD Pager: [phone] Office: [phone]

## 2019-10-10 ENCOUNTER — Other Ambulatory Visit: Payer: Self-pay | Admitting: Surgery

## 2019-10-10 DIAGNOSIS — Z951 Presence of aortocoronary bypass graft: Secondary | ICD-10-CM

## 2019-10-11 ENCOUNTER — Encounter: Payer: Self-pay | Admitting: Surgery

## 2019-10-11 ENCOUNTER — Ambulatory Visit (INDEPENDENT_AMBULATORY_CARE_PROVIDER_SITE_OTHER): Payer: Self-pay | Admitting: Surgery

## 2019-10-11 ENCOUNTER — Ambulatory Visit
Admission: RE | Admit: 2019-10-11 | Discharge: 2019-10-11 | Disposition: A | Discharge status: Home / Self Care | Payer: Medicare HMO | Source: Ambulatory Visit | Attending: Surgery | Admitting: Surgery

## 2019-10-11 ENCOUNTER — Other Ambulatory Visit: Payer: Self-pay

## 2019-10-11 VITALS — BP 116/67 | HR 58 | Temp 97.0°F | Resp 18 | Ht 71.0 in | Wt 180.4 lb

## 2019-10-11 DIAGNOSIS — Z951 Presence of aortocoronary bypass graft: Secondary | ICD-10-CM

## 2019-10-11 DIAGNOSIS — I2510 Atherosclerotic heart disease of native coronary artery without angina pectoris: Secondary | ICD-10-CM

## 2019-10-13 NOTE — Progress Notes (Signed)
    HPI: Patient returns for routine postoperative follow-up having undergone coronary artery bypass graft surgery x3 on 09/11/2019. The patient's early postoperative recovery while in the hospital was notable for an uncomplicated postoperative course. Since hospital discharge the patient reports that he has been feeling well.  He has been walking daily without chest pain or shortness of breath.   Current Outpatient Medications  Medication Sig Dispense Refill  . acetaminophen (TYLENOL) 325 MG tablet Take 2 tablets (650 mg total) by mouth every 6 (six) hours as needed for mild pain.    Marland Kitchen amLODipine (NORVASC) 5 MG tablet Take 1 tablet (5 mg total) by mouth daily. 90 tablet 3  . aspirin 81 MG EC tablet Take 1 tablet (81 mg total) by mouth daily. Swallow whole. 90 tablet 3  . atorvastatin (LIPITOR) 80 MG tablet Take 1 tablet (80 mg total) by mouth daily. 90 tablet 3  . clopidogrel (PLAVIX) 75 MG tablet Take 1 tablet (75 mg total) by mouth daily. 90 tablet 3  . HYDROcodone-acetaminophen (NORCO) 5-325 MG tablet Take 1 tablet by mouth every 6 (six) hours as needed for severe pain. 28 tablet 0  . metoprolol succinate (TOPROL-XL) 25 MG 24 hr tablet Take 2 tablets (50 mg total) by mouth daily. Take with or immediately following a meal. 90 tablet 3  . Polyethyl Glycol-Propyl Glycol (SYSTANE) 0.4-0.3 % GEL ophthalmic gel Place 1 application into both eyes daily as needed (dry eyes).    . Polyethylene Glycol 400 (BLINK TEARS) 0.25 % SOLN Apply 1 drop to eye 2 (two) times daily as needed (dry eyes).     . sertraline (ZOLOFT) 50 MG tablet Take 50 mg by mouth daily with breakfast.      No current facility-administered medications for this visit.    Physical Exam: BP 116/67 (BP Location: Right Arm, Patient Position: Sitting, Cuff Size: Normal)   Pulse (!) 58   Temp (!) 97 F (36.1 C)   Resp 18   Ht 5\' 11"  (1.803 m)   Wt 180 lb 6.4 oz (81.8 kg)   SpO2 97% Comment: RA  BMI 25.16 kg/m  He looks  well. Cardiac exam shows a regular rate and rhythm with normal heart sounds. Lungs are clear. The chest incision is healing well and the sternum is stable. His leg incision is healing well and there is no peripheral edema.  Diagnostic Tests:  CLINICAL DATA:  Status post CABG.  EXAM: CHEST - 2 VIEW  COMPARISON:  09/16/2019 chest radiograph and prior.  FINDINGS: Cardiomediastinal silhouette within normal limits. Sequela of CABG. Clear lungs. No pneumothorax or pleural effusion. No acute osseous abnormality.  IMPRESSION: No acute airspace disease.  Sequela of CABG.   Electronically Signed   By: Primitivo Gauze M.D.   On: 10/11/2019 12:22   Impression:  Overall he is making good progress following coronary artery bypass graft surgery.  I told him he could return to driving but should refrain from lifting anything heavier than 10 pounds for 3 months postoperatively.  Plan:  He is going to follow-up with Dr. Virgina Jock and will return to see me if he has any problems with his incisions.   Gaye Pollack, MD [facility] [phone]

## 2019-10-25 ENCOUNTER — Telehealth: Payer: Self-pay

## 2019-10-25 NOTE — Telephone Encounter (Signed)
Yes

## 2019-10-25 NOTE — Telephone Encounter (Signed)
Telephone encounter:  Reason for call: is it ok for pt to have his covid booster?   Usual provider: mp  Last office visit: 10/04/19  Next office visit: 01/10/20   Last hospitalization: 09/11/19 surgery  Current Outpatient Medications on File Prior to Visit  Medication Sig Dispense Refill  . acetaminophen (TYLENOL) 325 MG tablet Take 2 tablets (650 mg total) by mouth every 6 (six) hours as needed for mild pain.    Marland Kitchen amLODipine (NORVASC) 5 MG tablet Take 1 tablet (5 mg total) by mouth daily. 90 tablet 3  . aspirin 81 MG EC tablet Take 1 tablet (81 mg total) by mouth daily. Swallow whole. 90 tablet 3  . atorvastatin (LIPITOR) 80 MG tablet Take 1 tablet (80 mg total) by mouth daily. 90 tablet 3  . clopidogrel (PLAVIX) 75 MG tablet Take 1 tablet (75 mg total) by mouth daily. 90 tablet 3  . HYDROcodone-acetaminophen (NORCO) 5-325 MG tablet Take 1 tablet by mouth every 6 (six) hours as needed for severe pain. 28 tablet 0  . metoprolol succinate (TOPROL-XL) 25 MG 24 hr tablet Take 2 tablets (50 mg total) by mouth daily. Take with or immediately following a meal. 90 tablet 3  . Polyethyl Glycol-Propyl Glycol (SYSTANE) 0.4-0.3 % GEL ophthalmic gel Place 1 application into both eyes daily as needed (dry eyes).    . Polyethylene Glycol 400 (BLINK TEARS) 0.25 % SOLN Apply 1 drop to eye 2 (two) times daily as needed (dry eyes).     . sertraline (ZOLOFT) 50 MG tablet Take 50 mg by mouth daily with breakfast.      No current facility-administered medications on file prior to visit.

## 2019-11-17 DIAGNOSIS — Z23 Encounter for immunization: Secondary | ICD-10-CM | POA: Diagnosis not present

## 2020-01-01 DIAGNOSIS — I2510 Atherosclerotic heart disease of native coronary artery without angina pectoris: Secondary | ICD-10-CM | POA: Diagnosis not present

## 2020-01-02 LAB — LIPID PANEL
Chol/HDL Ratio: 2.4 ratio (ref 0.0–5.0)
Cholesterol, Total: 100 mg/dL (ref 100–199)
HDL: 42 mg/dL (ref >39)
LDL Chol Calc (NIH): 45 mg/dL (ref 0–99)
Triglycerides: 52 mg/dL (ref 0–149)
VLDL Cholesterol Cal: 13 mg/dL (ref 5–40)

## 2020-01-10 ENCOUNTER — Other Ambulatory Visit: Payer: Self-pay

## 2020-01-10 ENCOUNTER — Ambulatory Visit: Payer: Medicare HMO | Admitting: Cardiology

## 2020-01-10 ENCOUNTER — Encounter: Payer: Self-pay | Admitting: Cardiology

## 2020-01-10 VITALS — BP 110/65 | HR 57 | Resp 16 | Ht 71.0 in | Wt 175.0 lb

## 2020-01-10 DIAGNOSIS — E782 Mixed hyperlipidemia: Secondary | ICD-10-CM

## 2020-01-10 DIAGNOSIS — I2510 Atherosclerotic heart disease of native coronary artery without angina pectoris: Secondary | ICD-10-CM

## 2020-01-10 MED ORDER — METOPROLOL SUCCINATE ER 25 MG PO TB24: 25.0000 mg | ORAL_TABLET | Freq: Every day | ORAL | 3 refills | Status: DC

## 2020-01-10 MED ORDER — NITROGLYCERIN 0.4 MG SL SUBL: 0.4000 mg | SUBLINGUAL_TABLET | SUBLINGUAL | 3 refills | Status: DC | PRN

## 2020-01-10 MED ORDER — ATORVASTATIN CALCIUM 40 MG PO TABS: 40.0000 mg | ORAL_TABLET | Freq: Every day | ORAL | 3 refills | Status: DC

## 2020-01-10 MED ORDER — ASPIRIN 81 MG PO TBEC: 81.0000 mg | DELAYED_RELEASE_TABLET | Freq: Every day | ORAL | 3 refills | Status: DC

## 2020-01-10 MED ORDER — AMLODIPINE BESYLATE 5 MG PO TABS: 5.0000 mg | ORAL_TABLET | Freq: Every day | ORAL | 3 refills | Status: DC

## 2020-01-10 NOTE — Patient Instructions (Signed)
Diet & Lifestyle recommendations:  Physical activity recommendation (The Physical Activity Guidelines for Americans. JAMA 2018;Nov 12) At least 150-300 minutes a week of moderate-intensity, or 75-150 minutes a week of vigorous-intensity aerobic physical activity, or an equivalent combination of moderate- and vigorous-intensity aerobic activity. Adults should perform muscle-strengthening activities on 2 or more days a week. Older adults should do multicomponent physical activity that includes balance training as well as aerobic and muscle-strengthening activities. Benefits of increased physical activity include lower risk of mortality including cardiovascular mortality, lower risk of cardiovascular events and associated risk factors (hypertension and diabetes), and lower risk of many cancers (including bladder, breast, colon, endometrium, esophagus, kidney, lung, and stomach). Additional improvments have been seen in cognition, risk of dementia, anxiety and depression, improved bone health, lower risk of falls, and associated injuries.  Dietary recommendation The 2019 ACC/AHA guidelines promote nutrition as a main fixture of cardiovascular wellness, with a recommendation for a varied diet of fruit, vegetables, fish, legumes, and whole grains (Class I), as well as recommendations to reduce sodium, cholesterol, processed meats, and refined sugars (Class IIa recommendation).10 Sodium intake, a topic of some controversy as of late, is recommended to be kept at 1,500 mg/day or less, far below the average daily intake in the [place] of 3,409 mg/day, and notably below that of previous [place] recommendations for 300mg /day.10,11 For those unable to reach 1,500 mg/day, they recommend at least a reduction of 1000 mg/day.  A Pesco-Mediterranean Diet With Intermittent Fasting: JACC Review Topic of the Week. J Am Coll Cardiol 5974;16:3845-3646 Pesco-Mediterranean diet, it is supplemented with extra-virgin olive oil (EVOO),  which is the principle fat source, along with moderate amounts of dairy (particularly yogurt and cheese) and eggs, as well as modest amounts of alcohol consumption (ideally red wine with the evening meal), but few red and processed meats.

## 2020-01-10 NOTE — Progress Notes (Signed)
 Follow up visit  Subjective:   Carlos Davidson, male    DOB: 12/22/1940, 79 y.o.   MRN: [id number]   HPI  Chief Complaint  Patient presents with  . Coronary artery disease involving native coronary artery of  . Follow-up    79 y/o Caucasian male with hypertension, hyperlipidemia, NSTEMI 08/2019, treated with CABGX3 (LIMA-LAD, SVG-Diag, SVG-OM)  Patient is doing well. He denies chest pain, shortness of breath, palpitations, leg edema, orthopnea, PND, TIA/syncope.  But he has not called for cardiac rehab, he is already back up to walking 30 minutes regularly without any complaints.    Current Outpatient Medications on File Prior to Visit  Medication Sig Dispense Refill  . acetaminophen (TYLENOL) 325 MG tablet Take 2 tablets (650 mg total) by mouth every 6 (six) hours as needed for mild pain.    . amLODipine (NORVASC) 5 MG tablet Take 1 tablet (5 mg total) by mouth daily. 90 tablet 3  . aspirin 81 MG EC tablet Take 1 tablet (81 mg total) by mouth daily. Swallow whole. 90 tablet 3  . clopidogrel (PLAVIX) 75 MG tablet Take 1 tablet (75 mg total) by mouth daily. 90 tablet 3  . metoprolol succinate (TOPROL-XL) 25 MG 24 hr tablet Take 2 tablets (50 mg total) by mouth daily. Take with or immediately following a meal. 90 tablet 3  . sertraline (ZOLOFT) 50 MG tablet Take 50 mg by mouth daily with breakfast.    . atorvastatin (LIPITOR) 80 MG tablet Take 1 tablet (80 mg total) by mouth daily. (Patient not taking: Reported on 01/10/2020) 90 tablet 3   No current facility-administered medications on file prior to visit.    Cardiovascular & other pertient studies:  EKG 10/04/2019: Sinus rhythm 63 bpm Old anterolateral infarct  Op note 09/11/2019: CABGX3 (LIMA-LAD, SVG-Diag, SVG-OM) by Dr. Bartle  Coronary angiogram 09/07/2019: LM: Distal 40% stenosis w/severe calcification LAD: Ostial 90% stenosis with severe calcification LCx: No obvious lesion in LCx, but with TIMI II flow        OM1 long  90% stenosis RCA: Prox-mid 20% stenosis  Normal LVEDP Normal LVEF  Sub-optimal landing zone for LAD stent given severe calcification extending into distal LM. Recommend CVTS consult for CABG to LAD and OM1  Echocardiogram 09/07/2019: 1. Left ventricular ejection fraction, by estimation, is 60 to 65%. The  left ventricle has normal function. The left ventricle has no regional  wall motion abnormalities. Left ventricular diastolic parameters were  normal.  2. Right ventricular systolic function is normal. The right ventricular  size is normal.  3. No significant valvular abnormality.  4. Normal right atrial pressure.    Recent labs: 01/01/2020: Chol 100, TG 52, HDL 42, LDL 45  09/16/2019: Glucose 100, BUN/Cr 21/1.30. EGFR 53. Na/K 140/3.5.  H/H 1.7/35.2. MCV 93. Platelets 162 HbA1C 5.7% Chol 171, TG 49, HDL 50, LDL 111    Review of Systems  Constitutional: Positive for malaise/fatigue.  Cardiovascular: Negative for chest pain, dyspnea on exertion, leg swelling, palpitations and syncope.         Vitals:   01/10/20 1130  BP: 110/65  Pulse: (!) 57  Resp: 16  SpO2: 98%     Body mass index is 24.41 kg/m. Filed Weights   01/10/20 1130  Weight: 175 lb (79.4 kg)     Objective:   Physical Exam Vitals and nursing note reviewed.  Constitutional:      General: He is not in acute distress. Neck:       Vascular: No JVD.  Cardiovascular:     Rate and Rhythm: Normal rate and regular rhythm.     Heart sounds: Normal heart sounds. No murmur heard.   Pulmonary:     Effort: Pulmonary effort is normal.     Breath sounds: Normal breath sounds. No wheezing or rales.           Assessment & Recommendations:   79 y/o Caucasian male with hypertension, hyperlipidemia, NSTEMI 08/2019, treated with CABGX3 (LIMA-LAD, SVG-Diag, SVG-OM)  CAD: S/p CABG. No angina symptoms.  Given initial presentation with ACS prior to CABG, recommend Aspirin/plavix for 1 year (till  08/2020) Recommend amlodipine 5 mg, metoprolol succinate 25 mg daily, Lipitor 40 mg. Counseled regarding diet and exercise recommendations.  F/u in 6 months after repeat lipid panel.   Manish J Patwardhan, MD Pager: [phone] Office: [phone] 

## 2020-03-25 DIAGNOSIS — I10 Essential (primary) hypertension: Secondary | ICD-10-CM | POA: Diagnosis not present

## 2020-03-25 DIAGNOSIS — Z0000 Encounter for general adult medical examination without abnormal findings: Secondary | ICD-10-CM | POA: Diagnosis not present

## 2020-03-25 DIAGNOSIS — G629 Polyneuropathy, unspecified: Secondary | ICD-10-CM | POA: Diagnosis not present

## 2020-03-25 DIAGNOSIS — R5383 Other fatigue: Secondary | ICD-10-CM | POA: Diagnosis not present

## 2020-03-25 DIAGNOSIS — M720 Palmar fascial fibromatosis [Dupuytren]: Secondary | ICD-10-CM | POA: Diagnosis not present

## 2020-03-25 DIAGNOSIS — M545 Low back pain, unspecified: Secondary | ICD-10-CM | POA: Diagnosis not present

## 2020-03-25 DIAGNOSIS — E46 Unspecified protein-calorie malnutrition: Secondary | ICD-10-CM | POA: Diagnosis not present

## 2020-03-25 DIAGNOSIS — D692 Other nonthrombocytopenic purpura: Secondary | ICD-10-CM | POA: Diagnosis not present

## 2020-03-25 DIAGNOSIS — R69 Illness, unspecified: Secondary | ICD-10-CM | POA: Diagnosis not present

## 2020-04-23 DIAGNOSIS — H26493 Other secondary cataract, bilateral: Secondary | ICD-10-CM | POA: Diagnosis not present

## 2020-04-23 DIAGNOSIS — H35373 Puckering of macula, bilateral: Secondary | ICD-10-CM | POA: Diagnosis not present

## 2020-04-23 DIAGNOSIS — H35363 Drusen (degenerative) of macula, bilateral: Secondary | ICD-10-CM | POA: Diagnosis not present

## 2020-04-23 DIAGNOSIS — H524 Presbyopia: Secondary | ICD-10-CM | POA: Diagnosis not present

## 2020-04-23 DIAGNOSIS — H35033 Hypertensive retinopathy, bilateral: Secondary | ICD-10-CM | POA: Diagnosis not present

## 2020-04-24 DIAGNOSIS — L570 Actinic keratosis: Secondary | ICD-10-CM | POA: Diagnosis not present

## 2020-04-24 DIAGNOSIS — L814 Other melanin hyperpigmentation: Secondary | ICD-10-CM | POA: Diagnosis not present

## 2020-04-24 DIAGNOSIS — D692 Other nonthrombocytopenic purpura: Secondary | ICD-10-CM | POA: Diagnosis not present

## 2020-04-24 DIAGNOSIS — D225 Melanocytic nevi of trunk: Secondary | ICD-10-CM | POA: Diagnosis not present

## 2020-04-24 DIAGNOSIS — L821 Other seborrheic keratosis: Secondary | ICD-10-CM | POA: Diagnosis not present

## 2020-07-02 DIAGNOSIS — I2510 Atherosclerotic heart disease of native coronary artery without angina pectoris: Secondary | ICD-10-CM | POA: Diagnosis not present

## 2020-07-02 DIAGNOSIS — E782 Mixed hyperlipidemia: Secondary | ICD-10-CM | POA: Diagnosis not present

## 2020-07-03 LAB — LIPID PANEL
Chol/HDL Ratio: 2.6 ratio (ref 0.0–5.0)
Cholesterol, Total: 117 mg/dL (ref 100–199)
HDL: 45 mg/dL (ref >39)
LDL Chol Calc (NIH): 59 mg/dL (ref 0–99)
Triglycerides: 57 mg/dL (ref 0–149)
VLDL Cholesterol Cal: 13 mg/dL (ref 5–40)

## 2020-07-10 ENCOUNTER — Ambulatory Visit: Payer: Medicare HMO | Admitting: Cardiology

## 2020-07-26 ENCOUNTER — Other Ambulatory Visit: Payer: Self-pay

## 2020-07-26 ENCOUNTER — Ambulatory Visit: Payer: Medicare HMO | Admitting: Cardiology

## 2020-07-26 ENCOUNTER — Inpatient Hospital Stay: Payer: Medicare HMO

## 2020-07-26 ENCOUNTER — Encounter: Payer: Self-pay | Admitting: Cardiology

## 2020-07-26 VITALS — BP 126/67 | HR 85 | Temp 97.5°F | Resp 16 | Ht 71.0 in | Wt 185.2 lb

## 2020-07-26 DIAGNOSIS — I2510 Atherosclerotic heart disease of native coronary artery without angina pectoris: Secondary | ICD-10-CM

## 2020-07-26 DIAGNOSIS — R5383 Other fatigue: Secondary | ICD-10-CM

## 2020-07-26 DIAGNOSIS — R002 Palpitations: Secondary | ICD-10-CM | POA: Insufficient documentation

## 2020-07-26 NOTE — Progress Notes (Signed)
Follow up visit  Subjective:   Carlos Davidson, male    DOB: 06/17/1940, 80 y.o.   MRN: [id number]   HPI   Chief Complaint  Patient presents with   Coronary artery disease involving native coronary artery of   Follow-up    80 y/o Caucasian male with hypertension, hyperlipidemia, NSTEMI 08/2019, treated with CABGX3 (LIMA-LAD, SVG-Diag, SVG-OM)  Patient is doing well. He denies chest pain, shortness of breath.  Sometimes, he notices his heart beating fast, but denies feeling any irregular heartbeat.  He does report fatigue, more so even prior to his CABG, but denies any angina symptoms.  Of note, he did not undergo cardiac rehab after his CABG.  Current Outpatient Medications on File Prior to Visit  Medication Sig Dispense Refill   acetaminophen (TYLENOL) 325 MG tablet Take 2 tablets (650 mg total) by mouth every 6 (six) hours as needed for mild pain.     amLODipine (NORVASC) 5 MG tablet Take 1 tablet (5 mg total) by mouth daily. 90 tablet 3   aspirin 81 MG EC tablet Take 1 tablet (81 mg total) by mouth daily. Swallow whole. 90 tablet 3   atorvastatin (LIPITOR) 40 MG tablet Take 1 tablet (40 mg total) by mouth daily. 90 tablet 3   clopidogrel (PLAVIX) 75 MG tablet Take 1 tablet (75 mg total) by mouth daily. 90 tablet 3   metoprolol succinate (TOPROL-XL) 25 MG 24 hr tablet Take 1 tablet (25 mg total) by mouth daily. Take with or immediately following a meal. 90 tablet 3   nitroGLYCERIN (NITROSTAT) 0.4 MG SL tablet Place 1 tablet (0.4 mg total) under the tongue every 5 (five) minutes as needed for chest pain. 30 tablet 3   sertraline (ZOLOFT) 50 MG tablet Take 50 mg by mouth daily with breakfast.     No current facility-administered medications on file prior to visit.    Cardiovascular & other pertient studies:  EKG 07/26/2020: Sinus rhythm 58 bpm Nonspecific T-abnormality  Op note 09/11/2019: CABGX3 (LIMA-LAD, SVG-Diag, SVG-OM) by Dr. Cyndia Bent  Coronary angiogram 09/07/2019: LM:  Distal 40% stenosis w/severe calcification LAD: Ostial 90% stenosis with severe calcification LCx: No obvious lesion in LCx, but with TIMI II flow        OM1 long 90% stenosis RCA: Prox-mid 20% stenosis   Normal LVEDP Normal LVEF   Sub-optimal landing zone for LAD stent given severe calcification extending into distal LM. Recommend CVTS consult for CABG to LAD and OM1  Echocardiogram 09/07/2019: 1. Left ventricular ejection fraction, by estimation, is 60 to 65%. The  left ventricle has normal function. The left ventricle has no regional  wall motion abnormalities. Left ventricular diastolic parameters were  normal.  2. Right ventricular systolic function is normal. The right ventricular  size is normal.  3. No significant valvular abnormality.  4. Normal right atrial pressure.    Recent labs: 07/02/2020: Chol 117, TG 57, HDL 45, LDL 59  01/01/2020: Chol 100, TG 52, HDL 42, LDL 45  09/16/2019: Glucose 100, BUN/Cr 21/1.30. EGFR 53. Na/K 140/3.5.  H/H 1.7/35.2. MCV 93. Platelets 162 HbA1C 5.7% Chol 171, TG 49, HDL 50, LDL 111    Review of Systems  Constitutional: Positive for malaise/fatigue.  Cardiovascular:  Positive for palpitations. Negative for chest pain, dyspnea on exertion, leg swelling and syncope.        Vitals:   07/26/20 1322  BP: 126/67  Pulse: 85  Resp: 16  Temp: (!) 97.5 F (36.4 C)  SpO2:  97%     Body mass index is 25.83 kg/m. Filed Weights   07/26/20 1322  Weight: 185 lb 3.2 oz (84 kg)     Objective:   Physical Exam Vitals and nursing note reviewed.  Constitutional:      General: He is not in acute distress. Neck:     Vascular: No JVD.  Cardiovascular:     Rate and Rhythm: Normal rate and regular rhythm.     Pulses: Normal pulses.     Heart sounds: Normal heart sounds. No murmur heard. Pulmonary:     Effort: Pulmonary effort is normal.     Breath sounds: Normal breath sounds. No wheezing or rales.  Musculoskeletal:     Right  lower leg: No edema.     Left lower leg: No edema.          Assessment & Recommendations:   80 y/o Caucasian male with hypertension, hyperlipidemia, NSTEMI 08/2019, treated with CABGX3 (LIMA-LAD, SVG-Diag, SVG-OM)  CAD: S/p CABG (08/2019) No angina symptoms.  Okay to stop plavix. Continue Aspirin 81 mg daily Continue amlodipine 5 mg, metoprolol succinate 25 mg daily, Lipitor 40 mg.  Palpitations: 2 week cardiac telemetry  Fatigue: Likely multifactorial. Consider cardiac rehab. Patient would like to think about it.  F/u in 3 months    Nigel Mormon, MD Pager: [phone] Office: [phone]

## 2020-07-30 ENCOUNTER — Inpatient Hospital Stay: Payer: Medicare HMO

## 2020-07-30 DIAGNOSIS — R002 Palpitations: Secondary | ICD-10-CM | POA: Diagnosis not present

## 2020-08-16 DIAGNOSIS — R002 Palpitations: Secondary | ICD-10-CM | POA: Diagnosis not present

## 2020-08-18 DIAGNOSIS — R002 Palpitations: Secondary | ICD-10-CM | POA: Diagnosis not present

## 2020-08-19 NOTE — Progress Notes (Signed)
Noted 3 episodes of fast heart beat. Continue current medications. I would like to see him back in 10/2020.  Thanks MJP

## 2020-08-20 NOTE — Progress Notes (Signed)
Called and spoke to pts wife, she voiced understanding. Pt will call back later to schedule appt.

## 2020-10-04 DIAGNOSIS — F419 Anxiety disorder, unspecified: Secondary | ICD-10-CM | POA: Diagnosis not present

## 2020-10-04 DIAGNOSIS — D692 Other nonthrombocytopenic purpura: Secondary | ICD-10-CM | POA: Diagnosis not present

## 2020-10-04 DIAGNOSIS — E46 Unspecified protein-calorie malnutrition: Secondary | ICD-10-CM | POA: Diagnosis not present

## 2020-10-04 DIAGNOSIS — I2510 Atherosclerotic heart disease of native coronary artery without angina pectoris: Secondary | ICD-10-CM | POA: Diagnosis not present

## 2020-10-04 DIAGNOSIS — H35033 Hypertensive retinopathy, bilateral: Secondary | ICD-10-CM | POA: Diagnosis not present

## 2020-10-04 DIAGNOSIS — G629 Polyneuropathy, unspecified: Secondary | ICD-10-CM | POA: Diagnosis not present

## 2020-10-04 DIAGNOSIS — R5383 Other fatigue: Secondary | ICD-10-CM | POA: Diagnosis not present

## 2020-10-04 DIAGNOSIS — F330 Major depressive disorder, recurrent, mild: Secondary | ICD-10-CM | POA: Diagnosis not present

## 2020-10-04 DIAGNOSIS — E785 Hyperlipidemia, unspecified: Secondary | ICD-10-CM | POA: Diagnosis not present

## 2020-10-04 DIAGNOSIS — I10 Essential (primary) hypertension: Secondary | ICD-10-CM | POA: Diagnosis not present

## 2020-10-04 DIAGNOSIS — M545 Low back pain, unspecified: Secondary | ICD-10-CM | POA: Diagnosis not present

## 2020-10-04 DIAGNOSIS — M720 Palmar fascial fibromatosis [Dupuytren]: Secondary | ICD-10-CM | POA: Diagnosis not present

## 2020-10-04 DIAGNOSIS — R69 Illness, unspecified: Secondary | ICD-10-CM | POA: Diagnosis not present

## 2020-10-16 ENCOUNTER — Encounter: Payer: Self-pay | Admitting: Family Medicine

## 2020-10-30 NOTE — Progress Notes (Signed)
Carlos Pinks MD Reason for referral-CAD  HPI: 80 year old male for evaluation of coronary artery disease at request of Maury Dus, MD.  Previously followed by Dr. Virgina Jock.  Admitted with non-ST elevation myocardial infarction August 2021.  Cardiac catheterization revealed a 90% ostial LAD and 90% OM1.  Echocardiogram revealed normal LV function.  Preoperative carotid Doppler showed 1 to 39% bilateral stenosis.  Patient had coronary artery bypass and graft with a LIMA to the LAD, saphenous vein graft to the diagonal and saphenous vein graft to the obtuse marginal.  Monitor July 2022 showed sinus rhythm with PAT, PACs, PVCs and no atrial fibrillation was noted.  2 patient events correlated with sinus by report.  Patient denies dyspnea on exertion, orthopnea, PND, pedal edema, chest pain or syncope.  No claudication.  Current Outpatient Medications  Medication Sig Dispense Refill   amLODipine (NORVASC) 5 MG tablet Take 1 tablet (5 mg total) by mouth daily. 90 tablet 3   aspirin 81 MG EC tablet Take 1 tablet (81 mg total) by mouth daily. Swallow whole. 90 tablet 3   atorvastatin (LIPITOR) 40 MG tablet Take 1 tablet (40 mg total) by mouth daily. 90 tablet 3   metoprolol succinate (TOPROL-XL) 25 MG 24 hr tablet Take 1 tablet (25 mg total) by mouth daily. Take with or immediately following a meal. 90 tablet 3   sertraline (ZOLOFT) 50 MG tablet Take 50 mg by mouth daily with breakfast.     acetaminophen (TYLENOL) 325 MG tablet Take 650 mg by mouth every 6 (six) hours as needed. (Patient not taking: Reported on 11/05/2020)     clopidogrel (PLAVIX) 75 MG tablet Take 75 mg by mouth daily. (Patient not taking: Reported on 11/05/2020)     HYDROcodone-acetaminophen (NORCO/VICODIN) 5-325 MG tablet Take 1 tablet by mouth every 6 (six) hours as needed for moderate pain. (Patient not taking: Reported on 11/05/2020)     ibuprofen (ADVIL) 200 MG tablet Take 200 mg by mouth every 8 (eight) hours as  needed (As needed). (Patient not taking: Reported on 11/05/2020)     nitroGLYCERIN (NITROSTAT) 0.4 MG SL tablet Place 1 tablet (0.4 mg total) under the tongue every 5 (five) minutes as needed for chest pain. (Patient not taking: Reported on 11/05/2020) 30 tablet 3   Potassium Chloride CR (MICRO-K) 8 MEQ CPCR capsule CR Take 20 mEq by mouth daily. (Patient not taking: Reported on 11/05/2020)     No current facility-administered medications for this visit.    Allergies  Allergen Reactions   Aspirin Anaphylaxis   Tetracycline Other (See Comments)    Tightness of chest. Per patient was given in combination with another medication. Not sure of name.   Streptomycin Other (See Comments)    Tightness in chest 1970   Sulfonamide Derivatives Rash    Upper arms and stomach     Past Medical History:  Diagnosis Date   Arthritis    CAD (coronary artery disease)    Compression of sciatic nerve    right leg with numbness above knee at times   Depression    Dysrhythmia 01/20/2003   no cardiologist   GERD (gastroesophageal reflux disease)    H/O hiatal hernia    Hemorrhoids    Hyperlipidemia    Hypertension    pt unaware 08/18/11   Nephrolithiasis    Pain, lower leg    burning right leg--STATES TO ELEVATE RIGHT ANKLE   Psoriasis    Seasonal allergies    hay fever  Sleep apnea    STOP BANG SCORE 4   Umbilical hernia     Past Surgical History:  Procedure Laterality Date   CORONARY ARTERY BYPASS GRAFT N/A 09/11/2019   Procedure: CORONARY ARTERY BYPASS GRAFTING (CABG), ON PUMP, TIMES THREE, USING LEFT INTERNAL MAMMARY ARTERY AND ENDOSCOPICALLY HARVESTED RIGHT GREATER SAPHENOUS VEIN;  Surgeon: Gaye Pollack, MD;  Location: [facility];  Service: Open Heart Surgery;  Laterality: N/A;   HERNIA REPAIR  08/26/2011   INNER EAR SURGERY  07/24/2009   LEFT HEART CATH AND CORONARY ANGIOGRAPHY N/A 09/07/2019   Procedure: LEFT HEART CATH AND CORONARY ANGIOGRAPHY;  Surgeon: Nigel Mormon, MD;   Location: [facility];  Service: Cardiovascular;  Laterality: N/A;   PROSTATE SURGERY  1998   TURP   TEE WITHOUT CARDIOVERSION N/A 09/11/2019   Procedure: TRANSESOPHAGEAL ECHOCARDIOGRAM (TEE);  Surgeon: Gaye Pollack, MD;  Location: [facility];  Service: Open Heart Surgery;  Laterality: N/A;   UMBILICAL HERNIA REPAIR  08/26/2011   Procedure: HERNIA REPAIR UMBILICAL ADULT;  Surgeon: Imogene Burn. Georgette Dover, MD;  Location: [facility];  Service: General;  Laterality: N/A;  Umbilical Hernia Repair with Mesh    Social History   Socioeconomic History   Marital status: Married    Spouse name: Not on file   Number of children: 2   Years of education: Not on file   Highest education level: Not on file  Occupational History   Not on file  Tobacco Use   Smoking status: Never   Smokeless tobacco: Never  Vaping Use   Vaping Use: Never used  Substance and Sexual Activity   Alcohol use: No   Drug use: No   Sexual activity: Not on file  Other Topics Concern   Not on file  Social History Narrative   Not on file   Social Determinants of Health   Financial Resource Strain: Not on file  Food Insecurity: Not on file  Transportation Needs: Not on file  Physical Activity: Not on file  Stress: Not on file  Social Connections: Not on file  Intimate Partner Violence: Not on file    Family History  Problem Relation Age of Onset   Cancer Mother        breast   Heart disease Mother    Kidney disease Father    Heart attack Father    Hypertension Father    Heart disease Sister    Heart attack Paternal Grandmother    Cancer - Prostate Paternal Grandfather     ROS: no fevers or chills, productive cough, hemoptysis, dysphasia, odynophagia, melena, hematochezia, dysuria, hematuria, rash, seizure activity, orthopnea, PND, pedal edema, claudication. Remaining systems are negative.  Physical Exam:   Blood pressure 119/70, pulse (!) 53, height 5\' 10"  (1.778 m), weight 187 lb (84.8 kg), SpO2 98  %.  General:  Well developed/well nourished in NAD Skin warm/dry Patient not depressed No peripheral clubbing Back-normal HEENT-normal/normal eyelids Neck supple/normal carotid upstroke bilaterally; no bruits; no JVD; no thyromegaly chest - CTA/ normal expansion CV - RRR/normal S1 and S2; no murmurs, rubs or gallops;  PMI nondisplaced Abdomen -NT/ND, no HSM, no mass, + bowel sounds, no bruit 2+ femoral pulses, no bruits Ext-no edema, chords, 2+ DP Neuro-grossly nonfocal  ECG -sinus bradycardia at a rate of 53, nonspecific ST changes.  Personally reviewed  A/P  1 coronary artery disease status post coronary artery bypass graft-patient denies chest pain.  Continue aspirin and statin.  2 hypertension-blood pressure controlled.  Continue present medical regimen.  3 hyperlipidemia-continue statin.  4 palpitations-previous monitor unrevealing.  Symptoms have resolved.  Kirk Ruths, MD

## 2020-11-05 ENCOUNTER — Other Ambulatory Visit: Payer: Self-pay

## 2020-11-05 ENCOUNTER — Ambulatory Visit: Payer: Medicare HMO | Admitting: Cardiology

## 2020-11-05 ENCOUNTER — Encounter: Payer: Self-pay | Admitting: Cardiology

## 2020-11-05 VITALS — BP 119/70 | HR 53 | Ht 70.0 in | Wt 187.0 lb

## 2020-11-05 DIAGNOSIS — I2510 Atherosclerotic heart disease of native coronary artery without angina pectoris: Secondary | ICD-10-CM | POA: Diagnosis not present

## 2020-11-05 DIAGNOSIS — I10 Essential (primary) hypertension: Secondary | ICD-10-CM

## 2020-11-05 DIAGNOSIS — E782 Mixed hyperlipidemia: Secondary | ICD-10-CM

## 2020-11-05 NOTE — Patient Instructions (Signed)

## 2020-11-13 DIAGNOSIS — Z23 Encounter for immunization: Secondary | ICD-10-CM | POA: Diagnosis not present

## 2021-01-01 DIAGNOSIS — M720 Palmar fascial fibromatosis [Dupuytren]: Secondary | ICD-10-CM | POA: Diagnosis not present

## 2021-01-01 DIAGNOSIS — M13849 Other specified arthritis, unspecified hand: Secondary | ICD-10-CM | POA: Diagnosis not present

## 2021-01-01 DIAGNOSIS — M79642 Pain in left hand: Secondary | ICD-10-CM | POA: Diagnosis not present

## 2021-01-14 ENCOUNTER — Other Ambulatory Visit: Payer: Self-pay | Admitting: Cardiology

## 2021-01-14 DIAGNOSIS — I2510 Atherosclerotic heart disease of native coronary artery without angina pectoris: Secondary | ICD-10-CM

## 2021-01-30 DIAGNOSIS — I10 Essential (primary) hypertension: Secondary | ICD-10-CM | POA: Diagnosis not present

## 2021-01-30 DIAGNOSIS — E785 Hyperlipidemia, unspecified: Secondary | ICD-10-CM | POA: Diagnosis not present

## 2021-01-30 DIAGNOSIS — H35033 Hypertensive retinopathy, bilateral: Secondary | ICD-10-CM | POA: Diagnosis not present

## 2021-01-30 DIAGNOSIS — R69 Illness, unspecified: Secondary | ICD-10-CM | POA: Diagnosis not present

## 2021-02-11 ENCOUNTER — Other Ambulatory Visit: Payer: Self-pay | Admitting: Cardiology

## 2021-02-11 DIAGNOSIS — I2510 Atherosclerotic heart disease of native coronary artery without angina pectoris: Secondary | ICD-10-CM

## 2021-03-26 ENCOUNTER — Other Ambulatory Visit: Payer: Self-pay | Admitting: Cardiology

## 2021-03-26 DIAGNOSIS — I2510 Atherosclerotic heart disease of native coronary artery without angina pectoris: Secondary | ICD-10-CM

## 2021-04-01 DIAGNOSIS — Z0000 Encounter for general adult medical examination without abnormal findings: Secondary | ICD-10-CM | POA: Diagnosis not present

## 2021-04-01 DIAGNOSIS — F419 Anxiety disorder, unspecified: Secondary | ICD-10-CM | POA: Diagnosis not present

## 2021-04-01 DIAGNOSIS — I10 Essential (primary) hypertension: Secondary | ICD-10-CM | POA: Diagnosis not present

## 2021-04-01 DIAGNOSIS — E46 Unspecified protein-calorie malnutrition: Secondary | ICD-10-CM | POA: Diagnosis not present

## 2021-04-01 DIAGNOSIS — M545 Low back pain, unspecified: Secondary | ICD-10-CM | POA: Diagnosis not present

## 2021-04-01 DIAGNOSIS — F330 Major depressive disorder, recurrent, mild: Secondary | ICD-10-CM | POA: Diagnosis not present

## 2021-04-01 DIAGNOSIS — R69 Illness, unspecified: Secondary | ICD-10-CM | POA: Diagnosis not present

## 2021-04-01 DIAGNOSIS — M720 Palmar fascial fibromatosis [Dupuytren]: Secondary | ICD-10-CM | POA: Diagnosis not present

## 2021-04-01 DIAGNOSIS — E785 Hyperlipidemia, unspecified: Secondary | ICD-10-CM | POA: Diagnosis not present

## 2021-04-01 DIAGNOSIS — I25119 Atherosclerotic heart disease of native coronary artery with unspecified angina pectoris: Secondary | ICD-10-CM | POA: Diagnosis not present

## 2021-04-01 DIAGNOSIS — G629 Polyneuropathy, unspecified: Secondary | ICD-10-CM | POA: Diagnosis not present

## 2021-05-12 DIAGNOSIS — N3001 Acute cystitis with hematuria: Secondary | ICD-10-CM | POA: Diagnosis not present

## 2021-05-12 DIAGNOSIS — N390 Urinary tract infection, site not specified: Secondary | ICD-10-CM | POA: Diagnosis not present

## 2021-05-23 DIAGNOSIS — M4726 Other spondylosis with radiculopathy, lumbar region: Secondary | ICD-10-CM | POA: Diagnosis not present

## 2021-05-27 DIAGNOSIS — N3000 Acute cystitis without hematuria: Secondary | ICD-10-CM | POA: Diagnosis not present

## 2021-05-27 DIAGNOSIS — I10 Essential (primary) hypertension: Secondary | ICD-10-CM | POA: Diagnosis not present

## 2021-05-31 DIAGNOSIS — M545 Low back pain, unspecified: Secondary | ICD-10-CM | POA: Diagnosis not present

## 2021-06-03 DIAGNOSIS — M5416 Radiculopathy, lumbar region: Secondary | ICD-10-CM | POA: Diagnosis not present

## 2021-06-03 DIAGNOSIS — M48062 Spinal stenosis, lumbar region with neurogenic claudication: Secondary | ICD-10-CM | POA: Diagnosis not present

## 2021-06-09 DIAGNOSIS — M48062 Spinal stenosis, lumbar region with neurogenic claudication: Secondary | ICD-10-CM | POA: Diagnosis not present

## 2021-06-09 NOTE — Progress Notes (Signed)
HPI:FU coronary artery disease.  Previously followed by Dr. Virgina Jock.  Admitted with non-ST elevation myocardial infarction August 2021.  Cardiac catheterization revealed a 90% ostial LAD and 90% OM1.  Echocardiogram revealed normal LV function.  Preoperative carotid Doppler showed 1 to 39% bilateral stenosis.  Patient had coronary artery bypass and graft with a LIMA to the LAD, saphenous vein graft to the diagonal and saphenous vein graft to the obtuse marginal.  Monitor July 2022 showed sinus rhythm with PAT, PACs, PVCs and no atrial fibrillation was noted.  2 patient events correlated with sinus by report. Since last seen, he denies dyspnea, chest pain, palpitations or syncope.  Current Outpatient Medications  Medication Sig Dispense Refill   amLODipine (NORVASC) 5 MG tablet TAKE 1 TABLET BY MOUTH DAILY 90 tablet 3   aspirin 81 MG EC tablet Take 1 tablet (81 mg total) by mouth daily. Swallow whole. 90 tablet 3   atorvastatin (LIPITOR) 40 MG tablet TAKE 1 TABLET BY MOUTH DAILY 90 tablet 3   ibuprofen (ADVIL) 200 MG tablet Take 200 mg by mouth every 8 (eight) hours as needed (As needed).     metoprolol succinate (TOPROL-XL) 25 MG 24 hr tablet TAKE 1 TABLET BY MOUTH DAILY IMMEDIATELY FOLLOWING A MEAL 90 tablet 3   nitroGLYCERIN (NITROSTAT) 0.4 MG SL tablet Place 1 tablet (0.4 mg total) under the tongue every 5 (five) minutes as needed for chest pain. 30 tablet 3   sertraline (ZOLOFT) 50 MG tablet Take 50 mg by mouth daily with breakfast.     No current facility-administered medications for this visit.     Past Medical History:  Diagnosis Date   Arthritis    CAD (coronary artery disease)    Compression of sciatic nerve    right leg with numbness above knee at times   Depression    Dysrhythmia 01/20/2003   no cardiologist   GERD (gastroesophageal reflux disease)    H/O hiatal hernia    Hemorrhoids    Hyperlipidemia    Hypertension    pt unaware 08/18/11   Nephrolithiasis     Pain, lower leg    burning right leg--STATES TO ELEVATE RIGHT ANKLE   Psoriasis    Seasonal allergies    hay fever   Sleep apnea    STOP BANG SCORE 4   Umbilical hernia     Past Surgical History:  Procedure Laterality Date   CORONARY ARTERY BYPASS GRAFT N/A 09/11/2019   Procedure: CORONARY ARTERY BYPASS GRAFTING (CABG), ON PUMP, TIMES THREE, USING LEFT INTERNAL MAMMARY ARTERY AND ENDOSCOPICALLY HARVESTED RIGHT GREATER SAPHENOUS VEIN;  Surgeon: Gaye Pollack, MD;  Location: [facility];  Service: Open Heart Surgery;  Laterality: N/A;   HERNIA REPAIR  08/26/2011   INNER EAR SURGERY  07/24/2009   LEFT HEART CATH AND CORONARY ANGIOGRAPHY N/A 09/07/2019   Procedure: LEFT HEART CATH AND CORONARY ANGIOGRAPHY;  Surgeon: Nigel Mormon, MD;  Location: [facility];  Service: Cardiovascular;  Laterality: N/A;   PROSTATE SURGERY  1998   TURP   TEE WITHOUT CARDIOVERSION N/A 09/11/2019   Procedure: TRANSESOPHAGEAL ECHOCARDIOGRAM (TEE);  Surgeon: Gaye Pollack, MD;  Location: [facility];  Service: Open Heart Surgery;  Laterality: N/A;   UMBILICAL HERNIA REPAIR  08/26/2011   Procedure: HERNIA REPAIR UMBILICAL ADULT;  Surgeon: Imogene Burn. Georgette Dover, MD;  Location: [facility];  Service: General;  Laterality: N/A;  Umbilical Hernia Repair with Mesh    Social History   Socioeconomic  History   Marital status: Married    Spouse name: Not on file   Number of children: 2   Years of education: Not on file   Highest education level: Not on file  Occupational History   Not on file  Tobacco Use   Smoking status: Never   Smokeless tobacco: Never  Vaping Use   Vaping Use: Never used  Substance and Sexual Activity   Alcohol use: No   Drug use: No   Sexual activity: Not on file  Other Topics Concern   Not on file  Social History Narrative   Not on file   Social Determinants of Health   Financial Resource Strain: Not on file  Food Insecurity: Not on file  Transportation Needs: Not on file  Physical  Activity: Not on file  Stress: Not on file  Social Connections: Not on file  Intimate Partner Violence: Not on file    Family History  Problem Relation Age of Onset   Cancer Mother        breast   Heart disease Mother    Kidney disease Father    Heart attack Father    Hypertension Father    Heart disease Sister    Heart attack Paternal Grandmother    Cancer - Prostate Paternal Grandfather     ROS: Back pain but no fevers or chills, productive cough, hemoptysis, dysphasia, odynophagia, melena, hematochezia, dysuria, hematuria, rash, seizure activity, orthopnea, PND, pedal edema, claudication. Remaining systems are negative.  Physical Exam: Well-developed well-nourished in no acute distress.  Skin is warm and dry.  HEENT is normal.  Neck is supple.  Chest is clear to auscultation with normal expansion.  Cardiovascular exam is regular rate and rhythm.  Abdominal exam nontender or distended. No masses palpated. Extremities show no edema. neuro grossly intact  A/P  1 coronary artery disease-patient denies chest pain.  Continue medical therapy with aspirin and statin.  2 hyperlipidemia-continue statin.  We will have most recent lipids and liver forwarded to [place] from primary care.  3 hypertension-blood pressure controlled.  Continue present medications and follow.  4 history of palpitations-no recurrent symptoms.  Kirk Ruths, MD

## 2021-06-17 ENCOUNTER — Ambulatory Visit: Payer: Medicare HMO | Admitting: Cardiology

## 2021-06-17 ENCOUNTER — Encounter: Payer: Self-pay | Admitting: Cardiology

## 2021-06-17 VITALS — BP 128/70 | HR 78 | Ht 70.0 in | Wt 193.4 lb

## 2021-06-17 DIAGNOSIS — E782 Mixed hyperlipidemia: Secondary | ICD-10-CM

## 2021-06-17 DIAGNOSIS — I10 Essential (primary) hypertension: Secondary | ICD-10-CM | POA: Diagnosis not present

## 2021-06-17 DIAGNOSIS — I2510 Atherosclerotic heart disease of native coronary artery without angina pectoris: Secondary | ICD-10-CM

## 2021-06-17 DIAGNOSIS — R002 Palpitations: Secondary | ICD-10-CM

## 2021-06-17 MED ORDER — AMLODIPINE BESYLATE 5 MG PO TABS: 5.0000 mg | ORAL_TABLET | Freq: Every day | ORAL | 3 refills | Status: DC

## 2021-06-17 MED ORDER — NITROGLYCERIN 0.4 MG SL SUBL: 0.4000 mg | SUBLINGUAL_TABLET | SUBLINGUAL | 6 refills | Status: DC | PRN

## 2021-06-17 MED ORDER — METOPROLOL SUCCINATE ER 25 MG PO TB24: 25.0000 mg | ORAL_TABLET | Freq: Every day | ORAL | 3 refills | Status: DC

## 2021-06-17 MED ORDER — ATORVASTATIN CALCIUM 40 MG PO TABS: 40.0000 mg | ORAL_TABLET | Freq: Every day | ORAL | 3 refills | Status: DC

## 2021-06-17 NOTE — Patient Instructions (Signed)
  Follow-Up: At [facility], you and your health needs are our priority.  As part of our continuing mission to provide you with exceptional heart care, we have created designated Provider Care Teams.  These Care Teams include your primary Cardiologist (physician) and Advanced Practice Providers (APPs -  Physician Assistants and Nurse Practitioners) who all work together to provide you with the care you need, when you need it.  We recommend signing up for the patient portal called "MyChart".  Sign up information is provided on this After Visit Summary.  MyChart is used to connect with patients for Virtual Visits (Telemedicine).  Patients are able to view lab/test results, encounter notes, upcoming appointments, etc.  Non-urgent messages can be sent to your provider as well.   To learn more about what you can do with MyChart, go to [email or web address].    Your next appointment:   12 month(s)  The format for your next appointment:   In Person  Provider:   Kirk Ruths MD     Important Information About Sugar

## 2021-06-26 DIAGNOSIS — M48062 Spinal stenosis, lumbar region with neurogenic claudication: Secondary | ICD-10-CM | POA: Diagnosis not present

## 2021-07-08 DIAGNOSIS — M5416 Radiculopathy, lumbar region: Secondary | ICD-10-CM | POA: Diagnosis not present

## 2021-08-14 DIAGNOSIS — H35373 Puckering of macula, bilateral: Secondary | ICD-10-CM | POA: Diagnosis not present

## 2021-08-14 DIAGNOSIS — H04123 Dry eye syndrome of bilateral lacrimal glands: Secondary | ICD-10-CM | POA: Diagnosis not present

## 2021-08-14 DIAGNOSIS — H18513 Endothelial corneal dystrophy, bilateral: Secondary | ICD-10-CM | POA: Diagnosis not present

## 2021-08-14 DIAGNOSIS — H26493 Other secondary cataract, bilateral: Secondary | ICD-10-CM | POA: Diagnosis not present

## 2021-09-30 DIAGNOSIS — M48062 Spinal stenosis, lumbar region with neurogenic claudication: Secondary | ICD-10-CM | POA: Diagnosis not present

## 2021-10-22 DIAGNOSIS — M545 Low back pain, unspecified: Secondary | ICD-10-CM | POA: Diagnosis not present

## 2021-10-22 DIAGNOSIS — M720 Palmar fascial fibromatosis [Dupuytren]: Secondary | ICD-10-CM | POA: Diagnosis not present

## 2021-10-22 DIAGNOSIS — E46 Unspecified protein-calorie malnutrition: Secondary | ICD-10-CM | POA: Diagnosis not present

## 2021-10-22 DIAGNOSIS — I10 Essential (primary) hypertension: Secondary | ICD-10-CM | POA: Diagnosis not present

## 2021-10-22 DIAGNOSIS — E785 Hyperlipidemia, unspecified: Secondary | ICD-10-CM | POA: Diagnosis not present

## 2021-10-22 DIAGNOSIS — G629 Polyneuropathy, unspecified: Secondary | ICD-10-CM | POA: Diagnosis not present

## 2021-10-22 DIAGNOSIS — R69 Illness, unspecified: Secondary | ICD-10-CM | POA: Diagnosis not present

## 2021-10-22 DIAGNOSIS — Z23 Encounter for immunization: Secondary | ICD-10-CM | POA: Diagnosis not present

## 2021-10-22 DIAGNOSIS — I25119 Atherosclerotic heart disease of native coronary artery with unspecified angina pectoris: Secondary | ICD-10-CM | POA: Diagnosis not present

## 2021-10-22 DIAGNOSIS — D692 Other nonthrombocytopenic purpura: Secondary | ICD-10-CM | POA: Diagnosis not present

## 2021-10-22 DIAGNOSIS — R5383 Other fatigue: Secondary | ICD-10-CM | POA: Diagnosis not present

## 2021-10-24 DIAGNOSIS — M48062 Spinal stenosis, lumbar region with neurogenic claudication: Secondary | ICD-10-CM | POA: Diagnosis not present

## 2021-10-28 DIAGNOSIS — L821 Other seborrheic keratosis: Secondary | ICD-10-CM | POA: Diagnosis not present

## 2021-10-28 DIAGNOSIS — L720 Epidermal cyst: Secondary | ICD-10-CM | POA: Diagnosis not present

## 2021-10-28 DIAGNOSIS — D692 Other nonthrombocytopenic purpura: Secondary | ICD-10-CM | POA: Diagnosis not present

## 2021-10-28 DIAGNOSIS — L570 Actinic keratosis: Secondary | ICD-10-CM | POA: Diagnosis not present

## 2021-11-03 DIAGNOSIS — G629 Polyneuropathy, unspecified: Secondary | ICD-10-CM | POA: Diagnosis not present

## 2021-11-05 IMAGING — CR DG CHEST 2V
1 series · 1 of 1 positions shown · non-contrast
Comparison: Chest radiographs 08/18/2011.

CLINICAL DATA: Chest pain. Additional provided: Chest pain in mid
sternum, radiating to both arms and jaw, weakness.

EXAM:
CHEST - 2 VIEW

[chest pa]
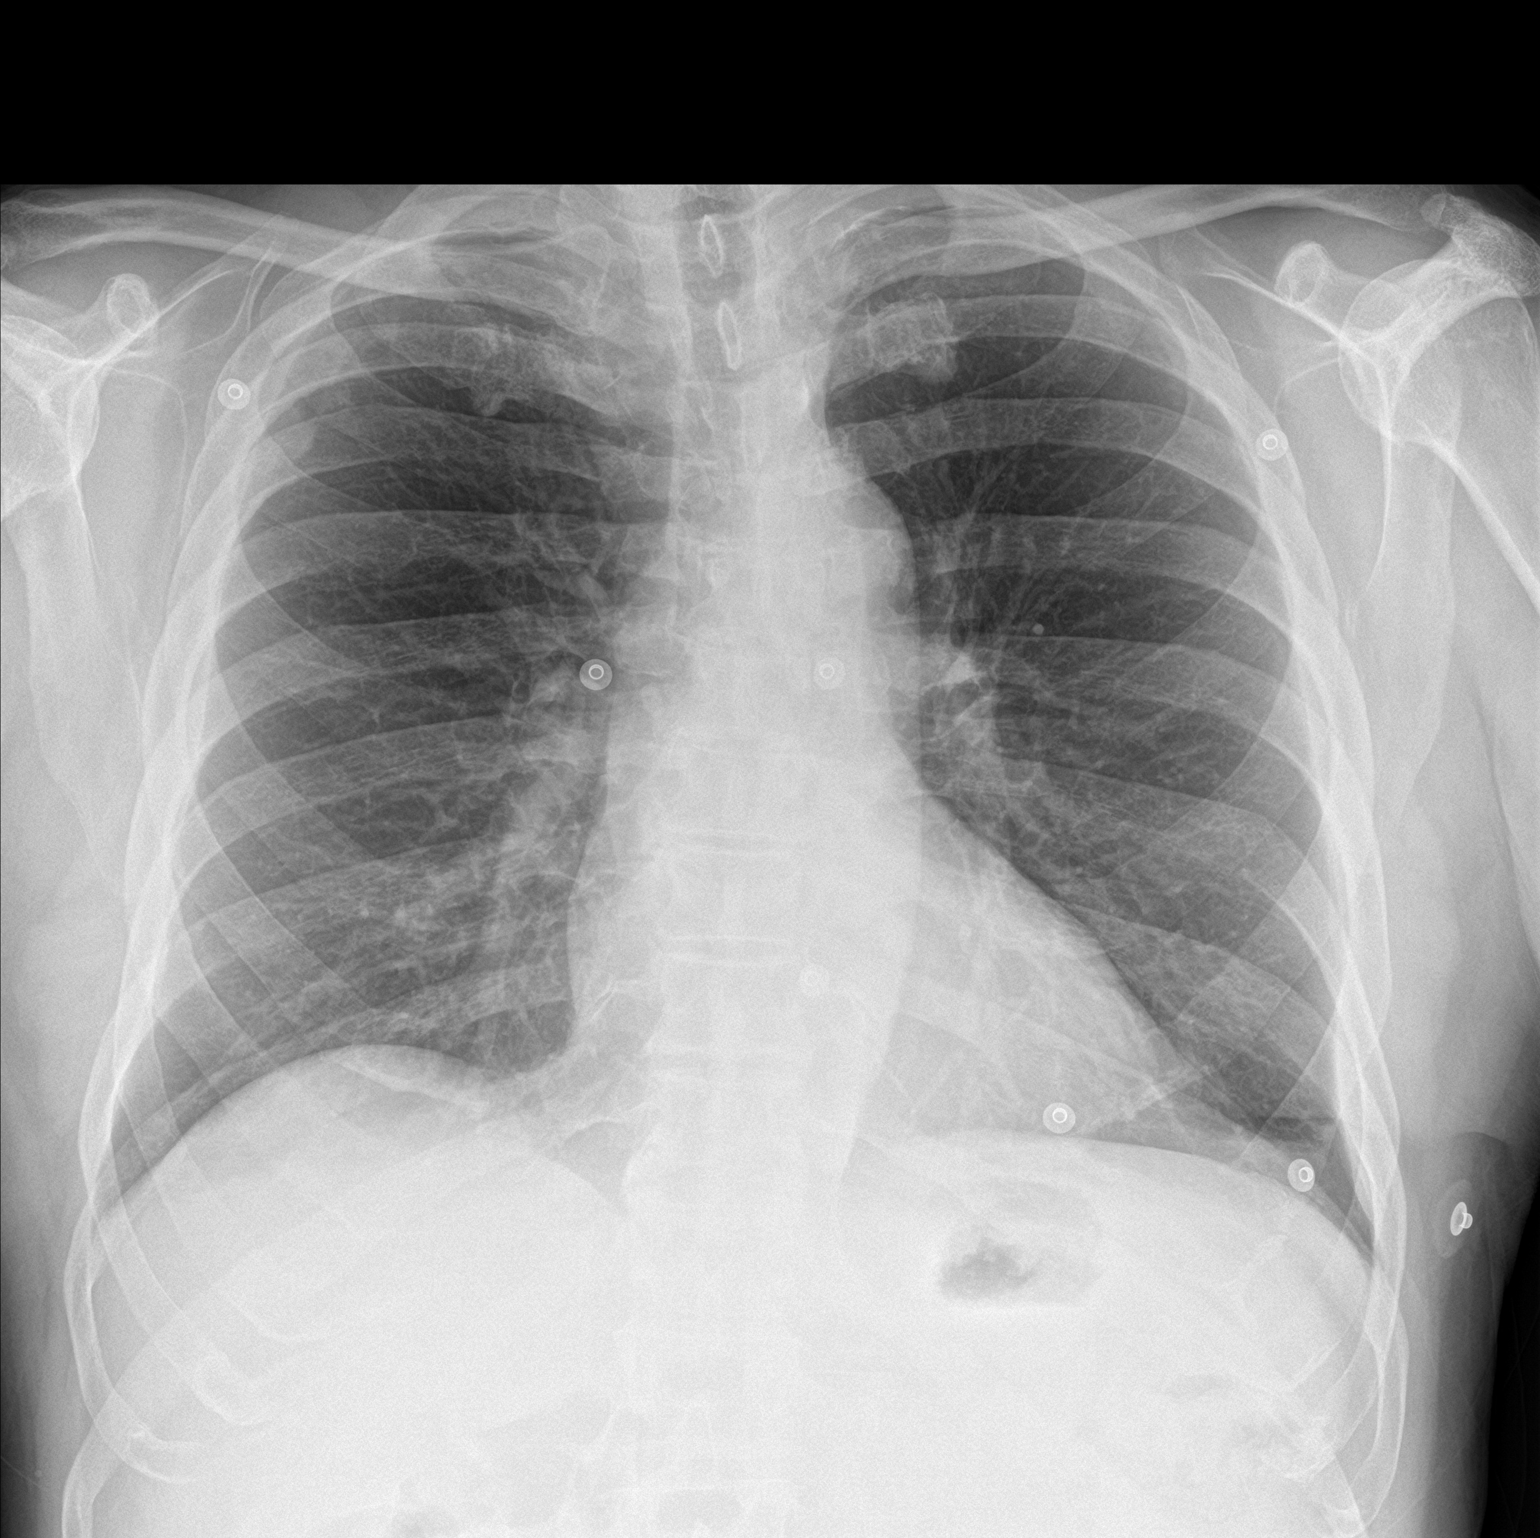

[1 of 1 positions shown; findings below may reference images not displayed]

FINDINGS: Heart size within normal limits. No appreciable airspace
consolidation or pulmonary edema. No evidence of pleural effusion or
pneumothorax. No acute bony abnormality identified.
IMPRESSION: No evidence of active cardiopulmonary disease.

## 2021-11-06 DIAGNOSIS — M48062 Spinal stenosis, lumbar region with neurogenic claudication: Secondary | ICD-10-CM | POA: Diagnosis not present

## 2021-11-10 IMAGING — DX DG CHEST 1V PORT
1 series · 1 of 1 positions shown · non-contrast
Comparison: September 06, 2019.

CLINICAL DATA: Status post coronary bypass graft.

EXAM:
PORTABLE CHEST 1 VIEW

[chest ap]
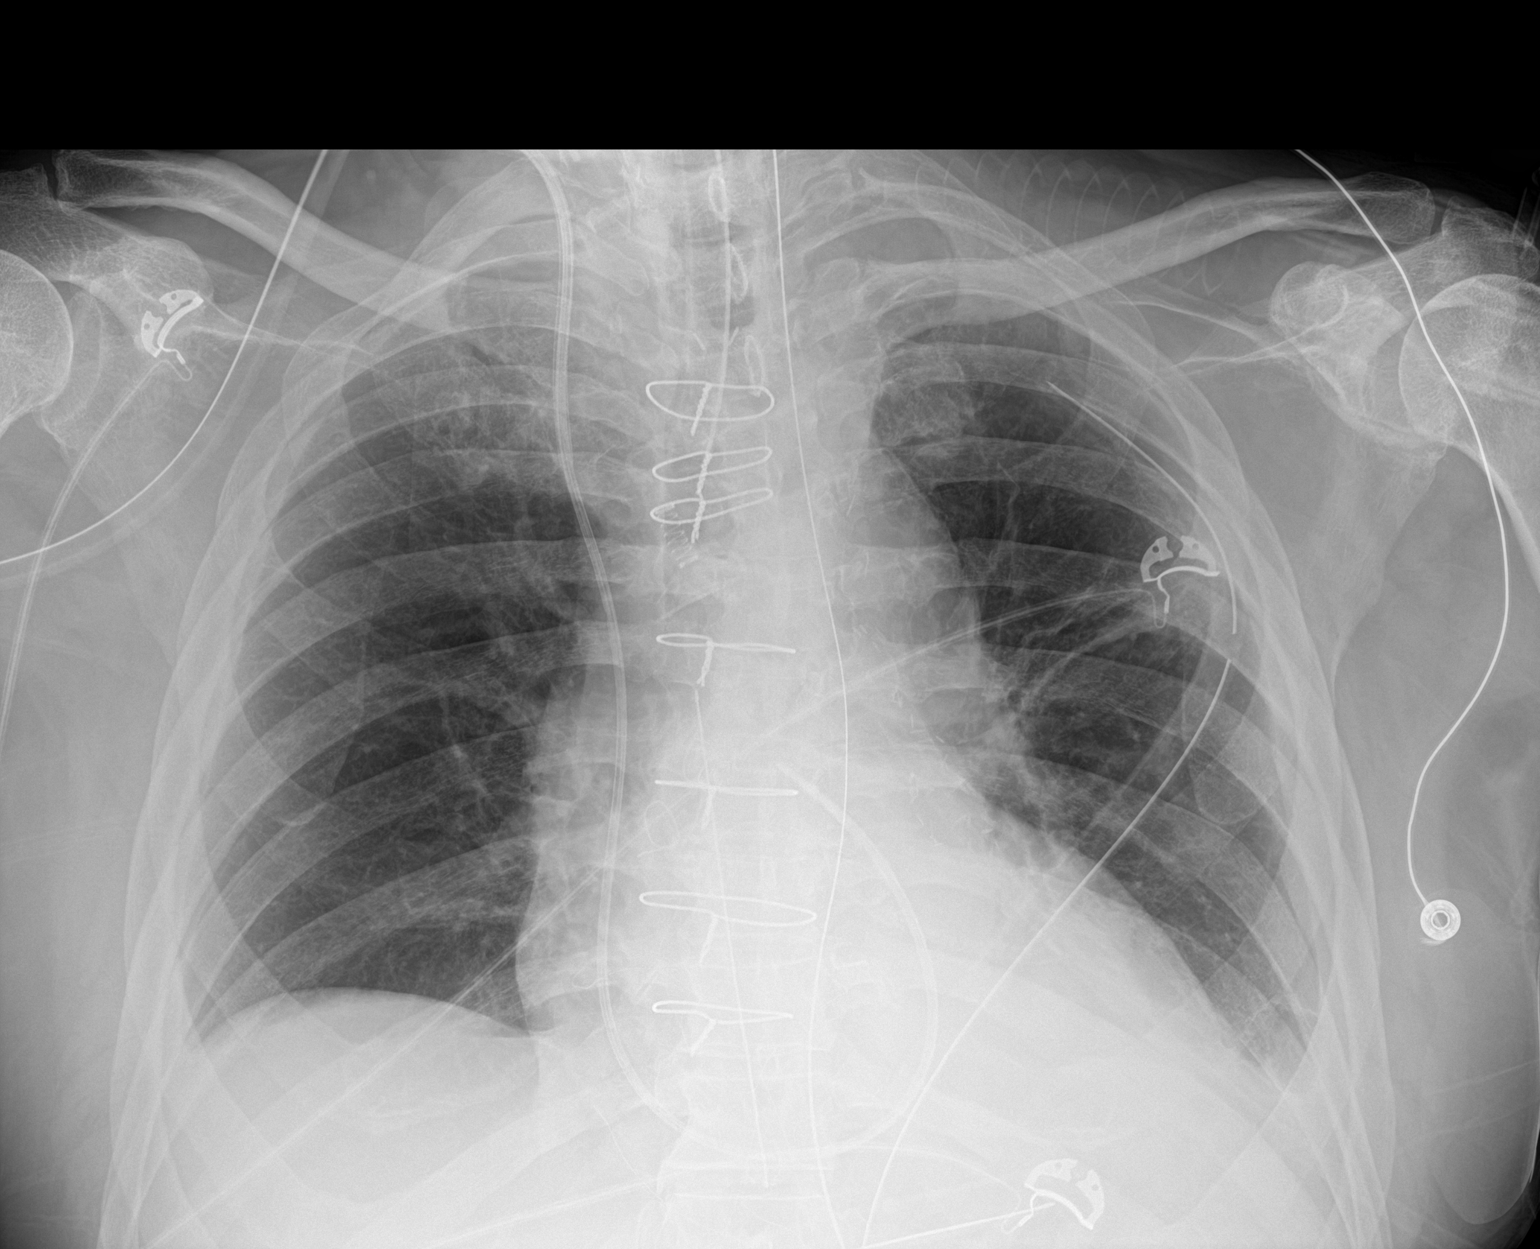

[1 of 1 positions shown; findings below may reference images not displayed]

FINDINGS: Stable cardiomediastinal silhouette. Endotracheal and nasogastric
tubes are unchanged in position. Right internal jugular Swan-Ganz
catheter is noted with distal tip directed toward right pulmonary
artery. No pneumothorax is noted. Left-sided chest tube is noted.
Right lung is clear. Mild left basilar subsegmental atelectasis is
noted with small pleural effusion. Bony thorax is unremarkable.
IMPRESSION: Stable support apparatus. No pneumothorax is noted. Mild left
basilar subsegmental atelectasis is noted with small pleural
effusion. Left-sided chest tube is noted without evidence of
pneumothorax.

## 2021-11-21 DIAGNOSIS — M5416 Radiculopathy, lumbar region: Secondary | ICD-10-CM | POA: Diagnosis not present

## 2021-11-26 ENCOUNTER — Other Ambulatory Visit: Payer: Self-pay | Admitting: Orthopedic Surgery

## 2022-01-21 NOTE — Progress Notes (Signed)
Surgical Instructions    Your procedure is scheduled on Thursday, 01/29/22.  Report to [facility] Main Entrance "A" at 5:30 A.M., then check in with the Admitting office.  Call this number if you have problems the morning of surgery:  [phone]   If you have any questions prior to your surgery date call [phone]: Open Monday-Friday 8am-4pm If you experience any cold or flu symptoms such as cough, fever, chills, shortness of breath, etc. between now and your scheduled surgery, please notify us at the above number     Remember:  Do not eat after midnight the night before your surgery  You may drink clear liquids until 4:30am the morning of your surgery.   Clear liquids allowed are: Water, Non-Citrus Juices (without pulp), Carbonated Beverages, Clear Tea, Black Coffee ONLY (NO MILK, CREAM OR POWDERED CREAMER of any kind), and Gatorade  Patient Instructions  The night before surgery:  No food after midnight. ONLY clear liquids after midnight  The day of surgery (if you do NOT have diabetes):  Drink ONE (1) Pre-Surgery Clear Ensure by 4:30am the morning of surgery. Drink in one sitting. Do not sip.  This drink was given to you during your hospital  pre-op appointment visit. Nothing else to drink after completing the  Pre-Surgery Clear Ensure.           If you have questions, please contact your surgeon's office.     Take these medicines the morning of surgery with A SIP OF WATER:  amLODipine (NORVASC)  atorvastatin (LIPITOR)  metoprolol succinate (TOPROL-XL)  sertraline (ZOLOFT)    As of today, STOP taking any Aspirin (unless otherwise instructed by your surgeon) Aleve, Naproxen, Ibuprofen, Motrin, Advil, Goody's, BC's, all herbal medications, fish oil, and all vitamins.           Do not wear jewelry or makeup. Do not wear lotions, powders, cologne or deodorant. Men may shave face and neck. Do not bring valuables to the hospital. Do not wear nail polish, gel polish,  artificial nails, or any other type of covering on natural nails (fingers and toes) If you have artificial nails or gel coating that need to be removed by a nail salon, please have this removed prior to surgery. Artificial nails or gel coating may interfere with anesthesia's ability to adequately monitor your vital signs.  [facility] is not responsible for any belongings or valuables.    Do NOT Smoke (Tobacco/Vaping)  24 hours prior to your procedure  If you use a CPAP at night, you may bring your mask for your overnight stay.   Contacts, glasses, hearing aids, dentures or partials may not be worn into surgery, please bring cases for these belongings   For patients admitted to the hospital, discharge time will be determined by your treatment team.   Patients discharged the day of surgery will not be allowed to drive home, and someone needs to stay with them for 24 hours.   SURGICAL WAITING ROOM VISITATION Patients having surgery or a procedure may have no more than 2 support people in the waiting area - these visitors may rotate.   Children under the age of 86 must have an adult with them who is not the patient. If the patient needs to stay at the hospital during part of their recovery, the visitor guidelines for inpatient rooms apply. Pre-op nurse will coordinate an appropriate time for 1 support person to accompany patient in pre-op.  This support person may not rotate.  Please refer to [email or web address] for the visitor guidelines for Inpatients (after your surgery is over and you are in a regular room).    Special instructions:    Oral Hygiene is also important to reduce your risk of infection.  Remember - BRUSH YOUR TEETH THE MORNING OF SURGERY WITH YOUR REGULAR TOOTHPASTE   [facility]- Preparing For Surgery  Before surgery, you can play an important role. Because skin is not sterile, your skin needs to be as free of germs as  possible. You can reduce the number of germs on your skin by washing with CHG (chlorahexidine gluconate) Soap before surgery.  CHG is an antiseptic cleaner which kills germs and bonds with the skin to continue killing germs even after washing.     Please do not use if you have an allergy to CHG or antibacterial soaps. If your skin becomes reddened/irritated stop using the CHG.  Do not shave (including legs and underarms) for at least 48 hours prior to first CHG shower. It is OK to shave your face.  Please follow these instructions carefully.     Shower the NIGHT BEFORE SURGERY and the MORNING OF SURGERY with CHG Soap.   If you chose to wash your hair, wash your hair first as usual with your normal shampoo. After you shampoo, rinse your hair and body thoroughly to remove the shampoo.  Then [employer] and genitals (private parts) with your normal soap and rinse thoroughly to remove soap.  After that Use CHG Soap as you would any other liquid soap. You can apply CHG directly to the skin and wash gently with a scrungie or a clean washcloth.   Apply the CHG Soap to your body ONLY FROM THE NECK DOWN.  Do not use on open wounds or open sores. Avoid contact with your eyes, ears, mouth and genitals (private parts). Wash Face and genitals (private parts)  with your normal soap.   Wash thoroughly, paying special attention to the area where your surgery will be performed.  Thoroughly rinse your body with warm water from the neck down.  DO NOT shower/wash with your normal soap after using and rinsing off the CHG Soap.  Pat yourself dry with a CLEAN TOWEL.  Wear CLEAN PAJAMAS to bed the night before surgery  Place CLEAN SHEETS on your bed the night before your surgery  DO NOT SLEEP WITH PETS.   Day of Surgery: Take a shower with CHG soap. Wear Clean/Comfortable clothing the morning of surgery Do not apply any deodorants/lotions.   Remember to brush your teeth WITH YOUR REGULAR  TOOTHPASTE.    If you received a COVID test during your pre-op visit, it is requested that you wear a mask when out in public, stay away from anyone that may not be feeling well, and notify your surgeon if you develop symptoms. If you have been in contact with anyone that has tested positive in the last 10 days, please notify your surgeon.    Please read over the following fact sheets that you were given.

## 2022-01-22 ENCOUNTER — Other Ambulatory Visit: Payer: Self-pay

## 2022-01-22 ENCOUNTER — Encounter (HOSPITAL_COMMUNITY)
Admission: RE | Admit: 2022-01-22 | Discharge: 2022-01-22 | Disposition: A | Discharge status: Home / Self Care | Payer: Medicare HMO | Source: Ambulatory Visit | Attending: Orthopedic Surgery | Admitting: Orthopedic Surgery

## 2022-01-22 ENCOUNTER — Encounter (HOSPITAL_COMMUNITY): Payer: Self-pay

## 2022-01-22 VITALS — BP 128/70 | HR 57 | Temp 97.4°F | Resp 18 | Ht 70.0 in | Wt 195.2 lb

## 2022-01-22 DIAGNOSIS — Z01818 Encounter for other preprocedural examination: Secondary | ICD-10-CM | POA: Diagnosis not present

## 2022-01-22 HISTORY — DX: Acute myocardial infarction, unspecified: I21.9

## 2022-01-22 HISTORY — DX: Personal history of urinary calculi: Z87.442

## 2022-01-22 HISTORY — DX: Polyneuropathy, unspecified: G62.9

## 2022-01-22 LAB — CBC
HCT: 43.4 % (ref 39.0–52.0)
Hemoglobin: 14.8 g/dL (ref 13.0–17.0)
MCH: 32.2 pg (ref 26.0–34.0)
MCHC: 34.1 g/dL (ref 30.0–36.0)
MCV: 94.6 fL (ref 80.0–100.0)
Platelets: 193 10*3/uL (ref 150–400)
RBC: 4.59 MIL/uL (ref 4.22–5.81)
RDW: 12.7 % (ref 11.5–15.5)
WBC: 7 10*3/uL (ref 4.0–10.5)
nRBC: 0 % (ref 0.0–0.2)

## 2022-01-22 LAB — BASIC METABOLIC PANEL
Anion gap: 6 (ref 5–15)
BUN: 16 mg/dL (ref 8–23)
CO2: 28 mmol/L (ref 22–32)
Calcium: 8.6 mg/dL — ABNORMAL LOW (ref 8.9–10.3)
Chloride: 106 mmol/L (ref 98–111)
Creatinine, Ser: 1.09 mg/dL (ref 0.61–1.24)
GFR, Estimated: >60 mL/min (ref >60)
Glucose, Bld: 94 mg/dL (ref 70–99)
Potassium: 4.3 mmol/L (ref 3.5–5.1)
Sodium: 140 mmol/L (ref 135–145)

## 2022-01-22 LAB — TYPE AND SCREEN
ABO/RH(D): A POS
Antibody Screen: NEGATIVE

## 2022-01-22 LAB — SURGICAL PCR SCREEN
MRSA, PCR: NEGATIVE
Staphylococcus aureus: NEGATIVE

## 2022-01-22 NOTE — Progress Notes (Addendum)
PCP - Maury Dus retied on Dec 31.  Dr Theodoro Doing Cardiologist - Youlanda Roys.  Last seen on 06/17/2021.  Per note ok to follow up once a year.   PPM/ICD - Denies   Chest x-ray - N/A EKG - 01/22/2021 PAT appointment Stress Test - Denies ECHO - 09/07/2019 Cardiac Cath - 09/07/2019 CABG procedure on 08/2019  Sleep Study - No.  Positive for screening.  Sleep Apnea Tool faxed to PCP Theodoro Doing   DM  Denies   Blood Thinner Instructions:  Denies Aspirin Instructions: Per pt he stopped on 01/20/2021  ERAS Protcol -Yes PRE-SURGERY Ensure or G2-  Given with instructions.  COVID TEST- N/A.  Per pt home test positive 3 weeks ago. No complications.    Anesthesia review: Yes, cardiac hx  Patient denies shortness of breath, fever, cough and chest pain at PAT appointment   All instructions explained to the patient, with a verbal understanding of the material. Patient agrees to go over the instructions while at home for a better understanding. Patient also instructed to self quarantine after being tested for COVID-19. The opportunity to ask questions was provided.

## 2022-01-23 NOTE — Progress Notes (Signed)
Anesthesia Chart Review:  Case: [id number] Date/Time: 01/29/22 0715   Procedure: LEFT-SIDED LUMBAR 4- LUMBAR 5 TRANSFORAMINAL LUMBAR INTERBODY FUSION AND DECOMPRESSION WITH INSTRUMENTATION AND ALLOGRAFT (Left)   Anesthesia type: General   Pre-op diagnosis: Severe spinal stenosis and instability, L4-L5   Location: [facility] OR ROOM 05 / [place] OR   Surgeons: Phylliss Bob, MD       DISCUSSION: Patient is a 82 year old male scheduled for the above procedure.  History includes never smoker, HTN, HLD, CAD (NSTEMI 09/06/19; s/p CABG x3: LIMA-LAD, SVG-DIAG, SVG-OM 09/11/19), psoriasis, GERD, hiatal hernia, neuropathy, nephrolithiasis, hernia (umbilical hernia repair 06/25/42), prostate surgery (1998).   Last cardiology visit with Dr. Stanford Breed was on 06/17/21. No chest pain or palpitations. 12 month follow-up planned. He had preoperative cardiology telephonic evaluation with Nicholes Rough, PA-C on 01/26/22. She wrote, "Mr.  Franchino's perioperative risk of a major cardiac event is 6.6% according to the Revised Cardiac Risk Index (RCRI).  Therefore, he is at high risk for perioperative complications.  His  functional capacity is good at 5.62 METs according to the Duke Activity Status Index (DASI). Recommendations: According to ACC/AHA guidelines, no further cardiovascular testing needed.  The patient may proceed to surgery at acceptable risk.   Antiplatelet and/or Anticoagulation Recommendations: Aspirin can be held for 7 days prior to his surgery.  Please resume Aspirin post operatively when it is felt to be safe from a bleeding standpoint."  He reported last ASA 01/20/21. He reported + home COVID test > 21 days ago.   Anesthesia team to evaluate on the day of surgery.    VS: BP 128/70   Pulse (!) 57   Temp (!) 36.3 C (Oral)   Resp 18   Ht '5\' 10"'$  (1.778 m)   Wt 88.5 kg   SpO2 97%   BMI 28.01 kg/m    PROVIDERS: Maury Dus, MD was PCP, recently retired 01/18/22 and will be transitioning to see Theodoro Doing, MD.   Kirk Ruths, MD is cardiologist, previously saw Vernell Leep, MD.   LABS: Preoperative labs noted.  (all labs ordered are listed, but only abnormal results are displayed)  Labs Reviewed  BASIC METABOLIC PANEL - Abnormal; Notable for the following components:      Result Value   Calcium 8.6 (*)    All other components within normal limits  SURGICAL PCR SCREEN  CBC  TYPE AND SCREEN    IMAGES: MRI L-spine 05/31/21: IMPRESSION: 1. Multilevel spondylosis of the lumbar spine as described. 2. The most severe disease is at L4-5 with severe central canal stenosis and moderate bilateral foraminal narrowing, right greater than left. 3. Moderate central canal stenosis at L2-3 with left greater than right subarticular narrowing. Moderate foraminal narrowing at this level is worse on the left. 4. Mild central canal stenosis at L3-4 with right greater than left foraminal narrowing. 5. Severe left and moderate right subarticular narrowing at L5-S1. 6. Moderate left and mild right foraminal stenosis at L5-S1. 7. Mild bilateral foraminal narrowing at L1-2.    EKG: 01/22/22: Sinus bradycardia with Premature atrial complexes Possible Left atrial enlargement Septal infarct , age undetermined ST & T wave abnormality, consider lateral ischemia When compared with ECG of 12-Sep-2019 06:46, rate is slower Confirmed by Minus Breeding [phone]) on 01/22/2022 5:22:44 PM - Overall, I think EKG is stable when compared to 11/05/20 tracing from [facility].   CV: Per 11/05/20 office note by Dr. Stanford Breed: "Monitor July 2022 showed sinus rhythm with PAT, PACs, PVCs and no atrial fibrillation was  noted. 2 patient events correlated with sinus by report."    TEE (Intra-op CABG) 09/11/19:  Left ventricle: Normal cavity size and wall thickness.    Left atrium: Left atrial appendage filling and emptying velocities are  decreased.   Aortic valve: Mild valve thickening present. Mild valve  calcification  present. No stenosis. Mild regurgitation with a centrally directed jet.    Mitral valve:  Mild regurgitation.    Right ventricle:  Normal cavity size and ejection fraction.    Tricuspid valve: Trace regurgitation.    Echo 09/07/19: IMPRESSIONS   1. Left ventricular ejection fraction, by estimation, is 60 to 65%. The  left ventricle has normal function. The left ventricle has no regional  wall motion abnormalities. Left ventricular diastolic parameters were  normal.   2. Right ventricular systolic function is normal. The right ventricular  size is normal.   3. No significant valvular abnormality.   4. Normal right atrial pressure.    US Carotid 09/08/19: Summary:  - Right Carotid: Velocities in the right ICA are consistent with a 1-39% stenosis.  - Left Carotid: Velocities in the left ICA are consistent with a 1-39% stenosis.  - Vertebrals: Bilateral vertebral arteries demonstrate antegrade flow.  - Subclavians: Normal flow hemodynamics were seen in bilateral subclavian arteries.    Last LHC was 09/07/19 pre-CABG.   Past Medical History:  Diagnosis Date   Arthritis    CAD (coronary artery disease)    Compression of sciatic nerve    right leg with numbness above knee at times   Depression    Dysrhythmia 01/20/2003   no cardiologist   GERD (gastroesophageal reflux disease)    H/O hiatal hernia    Hemorrhoids    History of kidney stones    Hyperlipidemia    Hypertension    pt unaware 08/18/11   Myocardial infarction ([place])    08/2018   Nephrolithiasis    Neuropathy    feet   Pain, lower leg    burning right leg--STATES TO ELEVATE RIGHT ANKLE   Psoriasis    Seasonal allergies    hay fever   Sleep apnea    STOP BANG SCORE 4   Umbilical hernia     Past Surgical History:  Procedure Laterality Date   CORONARY ARTERY BYPASS GRAFT N/A 09/11/2019   Procedure: CORONARY ARTERY BYPASS GRAFTING (CABG), ON PUMP, TIMES THREE, USING LEFT INTERNAL MAMMARY ARTERY  AND ENDOSCOPICALLY HARVESTED RIGHT GREATER SAPHENOUS VEIN;  Surgeon: Gaye Pollack, MD;  Location: [facility];  Service: Open Heart Surgery;  Laterality: N/A;   HERNIA REPAIR  08/26/2011   INNER EAR SURGERY  07/24/2009   LEFT HEART CATH AND CORONARY ANGIOGRAPHY N/A 09/07/2019   Procedure: LEFT HEART CATH AND CORONARY ANGIOGRAPHY;  Surgeon: Nigel Mormon, MD;  Location: [facility];  Service: Cardiovascular;  Laterality: N/A;   PROSTATE SURGERY  1998   TURP   TEE WITHOUT CARDIOVERSION N/A 09/11/2019   Procedure: TRANSESOPHAGEAL ECHOCARDIOGRAM (TEE);  Surgeon: Gaye Pollack, MD;  Location: [facility];  Service: Open Heart Surgery;  Laterality: N/A;   UMBILICAL HERNIA REPAIR  08/26/2011   Procedure: HERNIA REPAIR UMBILICAL ADULT;  Surgeon: Imogene Burn. Tsuei, MD;  Location: [facility];  Service: General;  Laterality: N/A;  Umbilical Hernia Repair with Mesh    MEDICATIONS:  amLODipine (NORVASC) 5 MG tablet   aspirin 81 MG EC tablet   atorvastatin (LIPITOR) 40 MG tablet   ibuprofen (ADVIL) 200 MG tablet   metoprolol succinate (  TOPROL-XL) 25 MG 24 hr tablet   nitroGLYCERIN (NITROSTAT) 0.4 MG SL tablet   sertraline (ZOLOFT) 50 MG tablet   No current facility-administered medications for this encounter.    Myra Gianotti, PA-C Surgical Short Stay/Anesthesiology [facility] Phone [phone] [facility] Phone [phone] 01/26/2022 4:33 PM

## 2022-01-26 ENCOUNTER — Telehealth: Payer: Self-pay | Admitting: *Deleted

## 2022-01-26 ENCOUNTER — Ambulatory Visit: Payer: Medicare HMO | Attending: Physician Assistant | Admitting: Physician Assistant

## 2022-01-26 DIAGNOSIS — Z01810 Encounter for preprocedural cardiovascular examination: Secondary | ICD-10-CM | POA: Diagnosis not present

## 2022-01-26 NOTE — Telephone Encounter (Signed)
   Pre-operative Risk Assessment    Patient Name: Carlos Davidson  DOB: 22-Jul-1940 MRN: [id number]      Request for Surgical Clearance    Procedure:   LEFT SIDED LUMBAR 4-5 TRANSFORAMINAL LUMBAR INTERBODY FUSION AND DECOMPRESSION WITH INSTRUMENTATION AND ALLOGRAFT  Date of Surgery:  Clearance 01/29/22                                 Surgeon:  DR. MARK DUMONSKI Surgeon's Group or Practice Name:  [facility] Phone number:  [phone] ATTN: Neita Garnet Fax number:  [phone]   Type of Clearance Requested:   - Medical ; ASA    Type of Anesthesia:  General    Additional requests/questions:    Jiles Prows   01/26/2022, 9:35 AM

## 2022-01-26 NOTE — Progress Notes (Signed)
Virtual Visit via Telephone Note   Because of Mathhew Buysse Davidson's co-morbid illnesses, she is at least at moderate risk for complications without adequate follow up.  This format is felt to be most appropriate for this patient at this time.  The patient did not have access to video technology/had technical difficulties with video requiring transitioning to audio format only (telephone).  All issues noted in this document were discussed and addressed.  No physical exam could be performed with this format.  Please refer to the patient's chart for her consent to telehealth for [facility].  Evaluation Performed:  Preoperative cardiovascular risk assessment _____________   Date:  01/26/2022   Patient ID:  Carlos Davidson, DOB 26-Apr-1940, MRN [id number] Patient Location:  Home Provider location:   Office  Primary Care Provider:  Maury Dus, MD Primary Cardiologist:  None  Chief Complaint / Patient Profile   82 y.o. y/o adult with a h/o CAD, hyperlipidemia, hypertension, depression, sleep apnea who is pending lower back surgery and presents today for telephonic preoperative cardiovascular risk assessment.  History of Present Illness    [name] is a 82 y.o. adult who presents via audio/video conferencing for a telehealth visit today.  Pt was last seen in cardiology clinic on 06/17/21 by Dr. Stanford Breed.  At that time Carlos Davidson was doing well. The patient is now pending procedure as outlined above.  He states that he has some shortness of breath when he exerts himself.  This has been going on for a while.  He states that it is likely due to being out of shape.  He goes up and down stairs every day.  He states he has a man cave upstairs.  He has a Scientist, clinical (histocompatibility and immunogenetics).  He enjoys hiking for recreation.  Because of this his DASI exceeds 4 METS.  Asa hold x 7 days.  He is already holding this medication since his surgery is on Thursday.  He can resume once medically safe to do  so.  Past Medical History    Past Medical History:  Diagnosis Date   Arthritis    CAD (coronary artery disease)    Compression of sciatic nerve    right leg with numbness above knee at times   Depression    Dysrhythmia 01/20/2003   no cardiologist   GERD (gastroesophageal reflux disease)    H/O hiatal hernia    Hemorrhoids    History of kidney stones    Hyperlipidemia    Hypertension    pt unaware 08/18/11   Myocardial infarction ([name])    08/2018   Nephrolithiasis    Neuropathy    feet   Pain, lower leg    burning right leg--STATES TO ELEVATE RIGHT ANKLE   Psoriasis    Seasonal allergies    hay fever   Sleep apnea    STOP BANG SCORE 4   Umbilical hernia    Past Surgical History:  Procedure Laterality Date   CORONARY ARTERY BYPASS GRAFT N/A 09/11/2019   Procedure: CORONARY ARTERY BYPASS GRAFTING (CABG), ON PUMP, TIMES THREE, USING LEFT INTERNAL MAMMARY ARTERY AND ENDOSCOPICALLY HARVESTED RIGHT GREATER SAPHENOUS VEIN;  Surgeon: Gaye Pollack, MD;  Location: [place];  Service: Open Heart Surgery;  Laterality: N/A;   HERNIA REPAIR  08/26/2011   INNER EAR SURGERY  07/24/2009   LEFT HEART CATH AND CORONARY ANGIOGRAPHY N/A 09/07/2019   Procedure: LEFT HEART CATH AND CORONARY ANGIOGRAPHY;  Surgeon: Nigel Mormon,  MD;  Location: [place];  Service: Cardiovascular;  Laterality: N/A;   PROSTATE SURGERY  1998   TURP   TEE WITHOUT CARDIOVERSION N/A 09/11/2019   Procedure: TRANSESOPHAGEAL ECHOCARDIOGRAM (TEE);  Surgeon: Gaye Pollack, MD;  Location: [place];  Service: Open Heart Surgery;  Laterality: N/A;   UMBILICAL HERNIA REPAIR  08/26/2011   Procedure: HERNIA REPAIR UMBILICAL ADULT;  Surgeon: Imogene Burn. Tsuei, MD;  Location: [facility];  Service: General;  Laterality: N/A;  Umbilical Hernia Repair with Mesh    Allergies  Allergies  Allergen Reactions   Aspirin Anaphylaxis   Tetracycline Other (See Comments)    Tightness of chest. Per patient was given in combination  with another medication. Not sure of name.   Streptomycin Other (See Comments)    Tightness in chest 1970   Sulfonamide Derivatives Rash    Upper arms and stomach    Home Medications    Prior to Admission medications   Medication Sig Start Date End Date Taking? Authorizing Provider  amLODipine (NORVASC) 5 MG tablet Take 1 tablet (5 mg total) by mouth daily. 06/17/21   Lelon Perla, MD  aspirin 81 MG EC tablet Take 1 tablet (81 mg total) by mouth daily. Swallow whole. 01/10/20   Patwardhan, Reynold Bowen, MD  atorvastatin (LIPITOR) 40 MG tablet Take 1 tablet (40 mg total) by mouth daily. 06/17/21   Lelon Perla, MD  ibuprofen (ADVIL) 200 MG tablet Take 200 mg by mouth every 8 (eight) hours as needed.    [provider]  metoprolol succinate (TOPROL-XL) 25 MG 24 hr tablet Take 1 tablet (25 mg total) by mouth daily. 06/17/21   Lelon Perla, MD  nitroGLYCERIN (NITROSTAT) 0.4 MG SL tablet Place 1 tablet (0.4 mg total) under the tongue every 5 (five) minutes as needed for chest pain. 06/17/21 01/16/24  Lelon Perla, MD  sertraline (ZOLOFT) 50 MG tablet Take 50 mg by mouth daily with breakfast.    [provider]    Physical Exam    Vital Signs:  [name] does not have vital signs available for review today.    Given telephonic nature of communication, physical exam is limited. AAOx3. NAD. Normal affect.  Speech and respirations are unlabored.  Accessory Clinical Findings    None  Assessment & Plan    1.  Preoperative Cardiovascular Risk Assessment:  Mr.  Hildreth perioperative risk of a major cardiac event is 6.6% according to the Revised Cardiac Risk Index (RCRI).  Therefore, he is at high risk for perioperative complications.  His  functional capacity is good at 5.62 METs according to the [facility] Activity Status Index (DASI). Recommendations: According to ACC/AHA guidelines, no further cardiovascular testing needed.  The patient may proceed to surgery  at acceptable risk.   Antiplatelet and/or Anticoagulation Recommendations: Aspirin can be held for 7 days prior to his surgery.  Please resume Aspirin post operatively when it is felt to be safe from a bleeding standpoint.   The patient was advised that if he develops new symptoms prior to surgery to contact our office to arrange for a follow-up visit, and he verbalized understanding.   A copy of this note will be routed to requesting surgeon.  Time:   Today, I have spent 10 minutes with the patient with telehealth technology discussing medical history, symptoms, and management plan.     Elgie Collard, PA-C  01/26/2022, 11:31 AM

## 2022-01-26 NOTE — Telephone Encounter (Signed)
I s/w the pt today and he he has been added on to pre op today per pre op provider Tessa due to med hold and procedure date. Our office just received the clearance request today for procedure 01/29/22. Pt states he has been holding his ASA since 3 days ago. Med rec not done by CMA today, but consent has been given.

## 2022-01-26 NOTE — Anesthesia Preprocedure Evaluation (Signed)
Anesthesia Evaluation  Patient identified by MRN, date of birth, ID band Patient awake    Reviewed: Allergy & Precautions, NPO status , Patient's Chart, lab work & pertinent test results  History of Anesthesia Complications Negative for: history of anesthetic complications  Airway Mallampati: II  TM Distance: >3 FB Neck ROM: Full    Dental  (+) Dental Advisory Given   Pulmonary sleep apnea    Pulmonary exam normal        Cardiovascular METS: 5 - 7 Mets hypertension, Pt. on medications and Pt. on home beta blockers + CAD, + Past MI and + CABG  Normal cardiovascular exam+ dysrhythmias      Neuro/Psych  PSYCHIATRIC DISORDERS  Depression     Neuromuscular disease    GI/Hepatic Neg liver ROS, hiatal hernia,GERD  Controlled,,  Endo/Other  negative endocrine ROS    Renal/GU negative Renal ROS     Musculoskeletal  (+) Arthritis ,    Abdominal   Peds  Hematology negative hematology ROS (+)   Anesthesia Other Findings   Reproductive/Obstetrics                             Anesthesia Physical Anesthesia Plan  ASA: 3  Anesthesia Plan: General   Post-op Pain Management: Ofirmev IV (intra-op)*   Induction: Intravenous  PONV Risk Score and Plan: 2 and Treatment may vary due to age or medical condition, Ondansetron and Propofol infusion  Airway Management Planned: Oral ETT  Additional Equipment: None  Intra-op Plan:   Post-operative Plan: Extubation in OR  Informed Consent: I have reviewed the patients History and Physical, chart, labs and discussed the procedure including the risks, benefits and alternatives for the proposed anesthesia with the patient or authorized representative who has indicated his/her understanding and acceptance.     Dental advisory given  Plan Discussed with: CRNA and Anesthesiologist  Anesthesia Plan Comments:        Anesthesia Quick Evaluation

## 2022-01-26 NOTE — Telephone Encounter (Signed)
  Patient Consent for Virtual Visit        Carlos Davidson has provided verbal consent on 01/26/2022 for a virtual visit (video or telephone).   CONSENT FOR VIRTUAL VISIT FOR:  Carlos Davidson  By participating in this virtual visit I agree to the following:  I hereby voluntarily request, consent and authorize [facility] and its employed or contracted physicians, physician assistants, nurse practitioners or other licensed health care professionals (the Practitioner), to provide me with telemedicine health care services (the "Services") as deemed necessary by the treating Practitioner. I acknowledge and consent to receive the Services by the Practitioner via telemedicine. I understand that the telemedicine visit will involve communicating with the Practitioner through live audiovisual communication technology and the disclosure of certain medical information by electronic transmission. I acknowledge that I have been given the opportunity to request an in-person assessment or other available alternative prior to the telemedicine visit and am voluntarily participating in the telemedicine visit.  I understand that I have the right to withhold or withdraw my consent to the use of telemedicine in the course of my care at any time, without affecting my right to future care or treatment, and that the Practitioner or I may terminate the telemedicine visit at any time. I understand that I have the right to inspect all information obtained and/or recorded in the course of the telemedicine visit and may receive copies of available information for a reasonable fee.  I understand that some of the potential risks of receiving the Services via telemedicine include:  Delay or interruption in medical evaluation due to technological equipment failure or disruption; Information transmitted may not be sufficient (e.g. poor resolution of images) to allow for appropriate medical decision making by the Practitioner;  and/or  In rare instances, security protocols could fail, causing a breach of personal health information.  Furthermore, I acknowledge that it is my responsibility to provide information about my medical history, conditions and care that is complete and accurate to the best of my ability. I acknowledge that Practitioner's advice, recommendations, and/or decision may be based on factors not within their control, such as incomplete or inaccurate data provided by me or distortions of diagnostic images or specimens that may result from electronic transmissions. I understand that the practice of medicine is not an exact science and that Practitioner makes no warranties or guarantees regarding treatment outcomes. I acknowledge that a copy of this consent can be made available to me via my patient portal ([facility]), or I can request a printed copy by calling the office of [facility].    I understand that my insurance will be billed for this visit.   I have read or had this consent read to me. I understand the contents of this consent, which adequately explains the benefits and risks of the Services being provided via telemedicine.  I have been provided ample opportunity to ask questions regarding this consent and the Services and have had my questions answered to my satisfaction. I give my informed consent for the services to be provided through the use of telemedicine in my medical care

## 2022-01-26 NOTE — Telephone Encounter (Signed)
   Name: Carlos Davidson  DOB: [birth year]  MRN: [id number]  Primary Cardiologist: None  Chart reviewed as part of pre-operative protocol coverage. Because of Carlos Davidson's past medical history and time since last visit, she will require a follow-up telephone visit in order to better assess preoperative cardiovascular risk.  Pre-op covering staff: - Please schedule appointment and call patient to inform them. If patient already had an upcoming appointment within acceptable timeframe, please add "pre-op clearance" to the appointment notes so provider is aware. - Please contact requesting surgeon's office via preferred method (i.e, phone, fax) to inform them of need for appointment prior to surgery.  He is status post CABG.  He can was ASA x 5-7 days prior to the procedure.  His procedure is in 3 days so far he is already holding his aspirin.  Elgie Collard, PA-C  01/26/2022, 11:03 AM

## 2022-01-29 ENCOUNTER — Encounter (HOSPITAL_COMMUNITY)
Admission: AD | Disposition: A | Discharge status: Home / Self Care | Payer: Self-pay | Source: Ambulatory Visit | Attending: Orthopedic Surgery

## 2022-01-29 ENCOUNTER — Ambulatory Visit (HOSPITAL_BASED_OUTPATIENT_CLINIC_OR_DEPARTMENT_OTHER): Payer: Medicare HMO | Admitting: Physician Assistant

## 2022-01-29 ENCOUNTER — Encounter (HOSPITAL_COMMUNITY): Payer: Self-pay | Admitting: Orthopedic Surgery

## 2022-01-29 ENCOUNTER — Ambulatory Visit (HOSPITAL_COMMUNITY): Payer: Medicare HMO

## 2022-01-29 ENCOUNTER — Other Ambulatory Visit: Payer: Self-pay

## 2022-01-29 ENCOUNTER — Observation Stay (HOSPITAL_COMMUNITY)
Admission: AD | Admit: 2022-01-29 | Discharge: 2022-01-30 | Disposition: A | Discharge status: Home / Self Care | Payer: Medicare HMO | Source: Ambulatory Visit | Attending: Orthopedic Surgery | Admitting: Orthopedic Surgery

## 2022-01-29 ENCOUNTER — Ambulatory Visit (HOSPITAL_COMMUNITY): Payer: Medicare HMO | Admitting: Physician Assistant

## 2022-01-29 DIAGNOSIS — I252 Old myocardial infarction: Secondary | ICD-10-CM

## 2022-01-29 DIAGNOSIS — Z79899 Other long term (current) drug therapy: Secondary | ICD-10-CM | POA: Insufficient documentation

## 2022-01-29 DIAGNOSIS — Z01818 Encounter for other preprocedural examination: Secondary | ICD-10-CM

## 2022-01-29 DIAGNOSIS — G4730 Sleep apnea, unspecified: Secondary | ICD-10-CM | POA: Diagnosis not present

## 2022-01-29 DIAGNOSIS — M48061 Spinal stenosis, lumbar region without neurogenic claudication: Secondary | ICD-10-CM | POA: Insufficient documentation

## 2022-01-29 DIAGNOSIS — M4316 Spondylolisthesis, lumbar region: Secondary | ICD-10-CM | POA: Diagnosis not present

## 2022-01-29 DIAGNOSIS — M532X6 Spinal instabilities, lumbar region: Secondary | ICD-10-CM | POA: Diagnosis not present

## 2022-01-29 DIAGNOSIS — M4326 Fusion of spine, lumbar region: Secondary | ICD-10-CM | POA: Diagnosis not present

## 2022-01-29 DIAGNOSIS — I2510 Atherosclerotic heart disease of native coronary artery without angina pectoris: Secondary | ICD-10-CM | POA: Insufficient documentation

## 2022-01-29 DIAGNOSIS — Z7982 Long term (current) use of aspirin: Secondary | ICD-10-CM | POA: Insufficient documentation

## 2022-01-29 DIAGNOSIS — M5416 Radiculopathy, lumbar region: Secondary | ICD-10-CM

## 2022-01-29 DIAGNOSIS — Z951 Presence of aortocoronary bypass graft: Secondary | ICD-10-CM | POA: Diagnosis not present

## 2022-01-29 DIAGNOSIS — Z0389 Encounter for observation for other suspected diseases and conditions ruled out: Secondary | ICD-10-CM | POA: Diagnosis not present

## 2022-01-29 HISTORY — PX: TRANSFORAMINAL LUMBAR INTERBODY FUSION (TLIF) WITH PEDICLE SCREW FIXATION 1 LEVEL: SHX6141

## 2022-01-29 SURGERY — TRANSFORAMINAL LUMBAR INTERBODY FUSION (TLIF) WITH PEDICLE SCREW FIXATION 1 LEVEL
Anesthesia: General | Site: Spine Lumbar | Laterality: Left

## 2022-01-29 MED ORDER — PHENYLEPHRINE 80 MCG/ML (10ML) SYRINGE FOR IV PUSH (FOR BLOOD PRESSURE SUPPORT)
PREFILLED_SYRINGE | INTRAVENOUS | Status: AC
  Filled 2022-01-29: qty 10

## 2022-01-29 MED ORDER — CEFAZOLIN SODIUM-DEXTROSE 2-4 GM/100ML-% IV SOLN
2.0000 g | INTRAVENOUS | Status: AC
  Administered 2022-01-29: 2 g via INTRAVENOUS
  Filled 2022-01-29: qty 100

## 2022-01-29 MED ORDER — POVIDONE-IODINE 7.5 % EX SOLN
Freq: Once | CUTANEOUS | Status: DC
  Filled 2022-01-29: qty 118

## 2022-01-29 MED ORDER — DOCUSATE SODIUM 100 MG PO CAPS
100.0000 mg | ORAL_CAPSULE | Freq: Two times a day (BID) | ORAL | Status: DC
  Administered 2022-01-29 (×2): 100 mg via ORAL
  Filled 2022-01-29 (×2): qty 1

## 2022-01-29 MED ORDER — MENTHOL 3 MG MT LOZG: 1.0000 | LOZENGE | OROMUCOSAL | Status: DC | PRN

## 2022-01-29 MED ORDER — SODIUM CHLORIDE 0.9 % IV SOLN
250.0000 mL | INTRAVENOUS | Status: DC
  Administered 2022-01-29: 250 mL via INTRAVENOUS

## 2022-01-29 MED ORDER — POTASSIUM CHLORIDE IN NACL 20-0.9 MEQ/L-% IV SOLN: INTRAVENOUS | Status: DC

## 2022-01-29 MED ORDER — METOPROLOL SUCCINATE ER 25 MG PO TB24: 25.0000 mg | ORAL_TABLET | Freq: Every day | ORAL | Status: DC

## 2022-01-29 MED ORDER — CEFAZOLIN SODIUM-DEXTROSE 2-4 GM/100ML-% IV SOLN
2.0000 g | Freq: Three times a day (TID) | INTRAVENOUS | Status: AC
  Administered 2022-01-29 (×2): 2 g via INTRAVENOUS
  Filled 2022-01-29 (×2): qty 100

## 2022-01-29 MED ORDER — METHOCARBAMOL 500 MG PO TABS: 500.0000 mg | ORAL_TABLET | Freq: Four times a day (QID) | ORAL | 2 refills | Status: AC | PRN

## 2022-01-29 MED ORDER — METHOCARBAMOL 1000 MG/10ML IJ SOLN: 500.0000 mg | Freq: Four times a day (QID) | INTRAVENOUS | Status: DC | PRN

## 2022-01-29 MED ORDER — FENTANYL CITRATE (PF) 100 MCG/2ML IJ SOLN
25.0000 ug | INTRAMUSCULAR | Status: AC | PRN
  Administered 2022-01-29 (×6): 25 ug via INTRAVENOUS

## 2022-01-29 MED ORDER — METHOCARBAMOL 500 MG PO TABS
500.0000 mg | ORAL_TABLET | Freq: Four times a day (QID) | ORAL | Status: DC | PRN
  Administered 2022-01-29 – 2022-01-30 (×3): 500 mg via ORAL
  Filled 2022-01-29 (×3): qty 1

## 2022-01-29 MED ORDER — ZOLPIDEM TARTRATE 5 MG PO TABS: 5.0000 mg | ORAL_TABLET | Freq: Every evening | ORAL | Status: DC | PRN

## 2022-01-29 MED ORDER — MIDAZOLAM HCL 2 MG/2ML IJ SOLN
INTRAMUSCULAR | Status: DC | PRN
  Administered 2022-01-29: 1 mg via INTRAVENOUS

## 2022-01-29 MED ORDER — PHENYLEPHRINE 80 MCG/ML (10ML) SYRINGE FOR IV PUSH (FOR BLOOD PRESSURE SUPPORT)
PREFILLED_SYRINGE | INTRAVENOUS | Status: DC | PRN
  Administered 2022-01-29 (×4): 80 ug via INTRAVENOUS

## 2022-01-29 MED ORDER — ROCURONIUM BROMIDE 10 MG/ML (PF) SYRINGE
PREFILLED_SYRINGE | INTRAVENOUS | Status: AC
  Filled 2022-01-29: qty 10

## 2022-01-29 MED ORDER — OXYCODONE HCL 5 MG PO TABS
ORAL_TABLET | ORAL | Status: AC
  Filled 2022-01-29: qty 1

## 2022-01-29 MED ORDER — OXYCODONE HCL 5 MG PO TABS
5.0000 mg | ORAL_TABLET | Freq: Once | ORAL | Status: AC | PRN
  Administered 2022-01-29: 5 mg via ORAL

## 2022-01-29 MED ORDER — EPHEDRINE 5 MG/ML INJ
INTRAVENOUS | Status: AC
  Filled 2022-01-29: qty 5

## 2022-01-29 MED ORDER — PHENOL 1.4 % MT LIQD: 1.0000 | OROMUCOSAL | Status: DC | PRN

## 2022-01-29 MED ORDER — FENTANYL CITRATE (PF) 100 MCG/2ML IJ SOLN
INTRAMUSCULAR | Status: AC
  Filled 2022-01-29: qty 2

## 2022-01-29 MED ORDER — OXYCODONE HCL 5 MG/5ML PO SOLN: 5.0000 mg | Freq: Once | ORAL | Status: AC | PRN

## 2022-01-29 MED ORDER — BSS IO SOLN
INTRAOCULAR | Status: AC
  Filled 2022-01-29: qty 15

## 2022-01-29 MED ORDER — FENTANYL CITRATE (PF) 250 MCG/5ML IJ SOLN
INTRAMUSCULAR | Status: AC
  Filled 2022-01-29: qty 5

## 2022-01-29 MED ORDER — BSS IO SOLN
15.0000 mL | Freq: Once | INTRAOCULAR | Status: AC
  Administered 2022-01-29: 15 mL

## 2022-01-29 MED ORDER — SENNOSIDES-DOCUSATE SODIUM 8.6-50 MG PO TABS: 1.0000 | ORAL_TABLET | Freq: Every evening | ORAL | Status: DC | PRN

## 2022-01-29 MED ORDER — DEXAMETHASONE SODIUM PHOSPHATE 10 MG/ML IJ SOLN
INTRAMUSCULAR | Status: AC
  Filled 2022-01-29: qty 1

## 2022-01-29 MED ORDER — PROPOFOL 10 MG/ML IV BOLUS
INTRAVENOUS | Status: DC | PRN
  Administered 2022-01-29: 120 mg via INTRAVENOUS

## 2022-01-29 MED ORDER — CHLORHEXIDINE GLUCONATE 0.12 % MT SOLN
15.0000 mL | Freq: Once | OROMUCOSAL | Status: AC
  Administered 2022-01-29: 15 mL via OROMUCOSAL
  Filled 2022-01-29: qty 15

## 2022-01-29 MED ORDER — FLEET ENEMA 7-19 GM/118ML RE ENEM: 1.0000 | ENEMA | Freq: Once | RECTAL | Status: DC | PRN

## 2022-01-29 MED ORDER — 0.9 % SODIUM CHLORIDE (POUR BTL) OPTIME
TOPICAL | Status: DC | PRN
  Administered 2022-01-29 (×2): 1000 mL

## 2022-01-29 MED ORDER — AMLODIPINE BESYLATE 5 MG PO TABS: 5.0000 mg | ORAL_TABLET | Freq: Every day | ORAL | Status: DC

## 2022-01-29 MED ORDER — ATORVASTATIN CALCIUM 40 MG PO TABS: 40.0000 mg | ORAL_TABLET | Freq: Every day | ORAL | Status: DC

## 2022-01-29 MED ORDER — BISACODYL 5 MG PO TBEC: 5.0000 mg | DELAYED_RELEASE_TABLET | Freq: Every day | ORAL | Status: DC | PRN

## 2022-01-29 MED ORDER — BUPIVACAINE HCL (PF) 0.25 % IJ SOLN
INTRAMUSCULAR | Status: AC
  Filled 2022-01-29: qty 30

## 2022-01-29 MED ORDER — ORAL CARE MOUTH RINSE: 15.0000 mL | Freq: Once | OROMUCOSAL | Status: AC

## 2022-01-29 MED ORDER — LIDOCAINE 2% (20 MG/ML) 5 ML SYRINGE
INTRAMUSCULAR | Status: AC
  Filled 2022-01-29: qty 5

## 2022-01-29 MED ORDER — SODIUM CHLORIDE 0.9% FLUSH: 3.0000 mL | INTRAVENOUS | Status: DC | PRN

## 2022-01-29 MED ORDER — NITROGLYCERIN 0.4 MG SL SUBL: 0.4000 mg | SUBLINGUAL_TABLET | SUBLINGUAL | Status: DC | PRN

## 2022-01-29 MED ORDER — ALBUMIN HUMAN 5 % IV SOLN: INTRAVENOUS | Status: DC | PRN

## 2022-01-29 MED ORDER — MIDAZOLAM HCL 2 MG/2ML IJ SOLN
INTRAMUSCULAR | Status: AC
  Filled 2022-01-29: qty 2

## 2022-01-29 MED ORDER — LACTATED RINGERS IV SOLN: INTRAVENOUS | Status: DC

## 2022-01-29 MED ORDER — MORPHINE SULFATE (PF) 2 MG/ML IV SOLN: 1.0000 mg | INTRAVENOUS | Status: DC | PRN

## 2022-01-29 MED ORDER — ONDANSETRON HCL 4 MG/2ML IJ SOLN: 4.0000 mg | Freq: Four times a day (QID) | INTRAMUSCULAR | Status: DC | PRN

## 2022-01-29 MED ORDER — ONDANSETRON HCL 4 MG/2ML IJ SOLN
INTRAMUSCULAR | Status: AC
  Filled 2022-01-29: qty 2

## 2022-01-29 MED ORDER — ACETAMINOPHEN 650 MG RE SUPP: 650.0000 mg | RECTAL | Status: DC | PRN

## 2022-01-29 MED ORDER — ALUM & MAG HYDROXIDE-SIMETH 200-200-20 MG/5ML PO SUSP: 30.0000 mL | Freq: Four times a day (QID) | ORAL | Status: DC | PRN

## 2022-01-29 MED ORDER — SUGAMMADEX SODIUM 200 MG/2ML IV SOLN
INTRAVENOUS | Status: DC | PRN
  Administered 2022-01-29: 200 mg via INTRAVENOUS

## 2022-01-29 MED ORDER — EPINEPHRINE PF 1 MG/ML IJ SOLN
INTRAMUSCULAR | Status: AC
  Filled 2022-01-29: qty 1

## 2022-01-29 MED ORDER — SODIUM CHLORIDE 0.9% FLUSH
3.0000 mL | Freq: Two times a day (BID) | INTRAVENOUS | Status: DC
  Administered 2022-01-29 (×2): 3 mL via INTRAVENOUS

## 2022-01-29 MED ORDER — THROMBIN 20000 UNITS EX SOLR
CUTANEOUS | Status: AC
  Filled 2022-01-29: qty 20000

## 2022-01-29 MED ORDER — HYDROCODONE-ACETAMINOPHEN 5-325 MG PO TABS: 1.0000 | ORAL_TABLET | ORAL | Status: DC | PRN

## 2022-01-29 MED ORDER — OXYCODONE-ACETAMINOPHEN 5-325 MG PO TABS
1.0000 | ORAL_TABLET | ORAL | Status: DC | PRN
  Administered 2022-01-29 (×2): 2 via ORAL
  Administered 2022-01-30: 1 via ORAL
  Administered 2022-01-30: 2 via ORAL
  Filled 2022-01-29 (×2): qty 2
  Filled 2022-01-29: qty 1
  Filled 2022-01-29: qty 2

## 2022-01-29 MED ORDER — BUPIVACAINE LIPOSOME 1.3 % IJ SUSP
INTRAMUSCULAR | Status: AC
  Filled 2022-01-29: qty 20

## 2022-01-29 MED ORDER — LIDOCAINE 2% (20 MG/ML) 5 ML SYRINGE
INTRAMUSCULAR | Status: DC | PRN
  Administered 2022-01-29: 60 mg via INTRAVENOUS

## 2022-01-29 MED ORDER — FENTANYL CITRATE (PF) 250 MCG/5ML IJ SOLN
INTRAMUSCULAR | Status: DC | PRN
  Administered 2022-01-29: 50 ug via INTRAVENOUS

## 2022-01-29 MED ORDER — BUPIVACAINE-EPINEPHRINE 0.25% -1:200000 IJ SOLN
INTRAMUSCULAR | Status: DC | PRN
  Administered 2022-01-29: 10 mL

## 2022-01-29 MED ORDER — ONDANSETRON HCL 4 MG/2ML IJ SOLN: 4.0000 mg | Freq: Once | INTRAMUSCULAR | Status: DC | PRN

## 2022-01-29 MED ORDER — PROPOFOL 10 MG/ML IV BOLUS
INTRAVENOUS | Status: AC
  Filled 2022-01-29: qty 20

## 2022-01-29 MED ORDER — ACETAMINOPHEN 10 MG/ML IV SOLN
INTRAVENOUS | Status: DC | PRN
  Administered 2022-01-29: 1000 mg via INTRAVENOUS

## 2022-01-29 MED ORDER — ACETAMINOPHEN 10 MG/ML IV SOLN
INTRAVENOUS | Status: AC
  Filled 2022-01-29: qty 100

## 2022-01-29 MED ORDER — PHENYLEPHRINE HCL-NACL 20-0.9 MG/250ML-% IV SOLN
INTRAVENOUS | Status: DC | PRN
  Administered 2022-01-29: 80 ug/min via INTRAVENOUS

## 2022-01-29 MED ORDER — ONDANSETRON HCL 4 MG/2ML IJ SOLN
INTRAMUSCULAR | Status: DC | PRN
  Administered 2022-01-29: 4 mg via INTRAVENOUS

## 2022-01-29 MED ORDER — ROCURONIUM BROMIDE 10 MG/ML (PF) SYRINGE
PREFILLED_SYRINGE | INTRAVENOUS | Status: DC | PRN
  Administered 2022-01-29 (×2): 10 mg via INTRAVENOUS
  Administered 2022-01-29: 70 mg via INTRAVENOUS

## 2022-01-29 MED ORDER — EPINEPHRINE 1 MG/10ML IJ SOSY
PREFILLED_SYRINGE | INTRAMUSCULAR | Status: DC | PRN
  Administered 2022-01-29: 40 mL

## 2022-01-29 MED ORDER — SUCCINYLCHOLINE CHLORIDE 200 MG/10ML IV SOSY
PREFILLED_SYRINGE | INTRAVENOUS | Status: AC
  Filled 2022-01-29: qty 10

## 2022-01-29 MED ORDER — ACETAMINOPHEN 325 MG PO TABS: 650.0000 mg | ORAL_TABLET | ORAL | Status: DC | PRN

## 2022-01-29 MED ORDER — OXYCODONE-ACETAMINOPHEN 5-325 MG PO TABS: 1.0000 | ORAL_TABLET | ORAL | 0 refills | Status: AC | PRN

## 2022-01-29 MED ORDER — THROMBIN 20000 UNITS EX SOLR
CUTANEOUS | Status: DC | PRN
  Administered 2022-01-29: 20 mL via TOPICAL

## 2022-01-29 MED ORDER — DEXAMETHASONE SODIUM PHOSPHATE 10 MG/ML IJ SOLN
INTRAMUSCULAR | Status: DC | PRN
  Administered 2022-01-29: 10 mg via INTRAVENOUS

## 2022-01-29 MED ORDER — EPINEPHRINE 1 MG/10ML IJ SOSY: PREFILLED_SYRINGE | INTRAMUSCULAR | Status: DC | PRN

## 2022-01-29 MED ORDER — SERTRALINE HCL 50 MG PO TABS
50.0000 mg | ORAL_TABLET | Freq: Every day | ORAL | Status: DC
  Administered 2022-01-30: 50 mg via ORAL
  Filled 2022-01-29: qty 1

## 2022-01-29 MED ORDER — ONDANSETRON HCL 4 MG PO TABS: 4.0000 mg | ORAL_TABLET | Freq: Four times a day (QID) | ORAL | Status: DC | PRN

## 2022-01-29 SURGICAL SUPPLY — 84 items
BAG COUNTER SPONGE SURGICOUNT (BAG) ×1 IMPLANT
BENZOIN TINCTURE PRP APPL 2/3 (GAUZE/BANDAGES/DRESSINGS) ×1 IMPLANT
BLADE CLIPPER SURG (BLADE) IMPLANT
BUR PRESCISION 1.7 ELITE (BURR) ×1 IMPLANT
BUR ROUND FLUTED 5 RND (BURR) ×1 IMPLANT
BUR ROUND PRECISION 4.0 (BURR) IMPLANT
BUR SABER RD CUTTING 3.0 (BURR) IMPLANT
CAGE SABLE 12X26 9-16 8D (Cage) IMPLANT
CNTNR URN SCR LID CUP LEK RST (MISCELLANEOUS) ×1 IMPLANT
CONT SPEC 4OZ STRL OR WHT (MISCELLANEOUS) ×1
COVER BACK TABLE 60X90IN (DRAPES) ×1 IMPLANT
COVER MAYO STAND STRL (DRAPES) ×2 IMPLANT
COVER SURGICAL LIGHT HANDLE (MISCELLANEOUS) ×1 IMPLANT
DERMABOND ADVANCED .7 DNX12 (GAUZE/BANDAGES/DRESSINGS) IMPLANT
DRAIN CHANNEL 15F RND FF W/TCR (WOUND CARE) IMPLANT
DRAPE C-ARM 42X72 X-RAY (DRAPES) ×1 IMPLANT
DRAPE C-ARMOR (DRAPES) IMPLANT
DRAPE POUCH INSTRU U-SHP 10X18 (DRAPES) ×1 IMPLANT
DRAPE SURG 17X23 STRL (DRAPES) ×4 IMPLANT
DURAPREP 26ML APPLICATOR (WOUND CARE) ×1 IMPLANT
ELECT BLADE 4.0 EZ CLEAN MEGAD (MISCELLANEOUS) ×1
ELECT CAUTERY BLADE 6.4 (BLADE) ×1 IMPLANT
ELECT REM PT RETURN 9FT ADLT (ELECTROSURGICAL) ×1
ELECTRODE BLDE 4.0 EZ CLN MEGD (MISCELLANEOUS) ×1 IMPLANT
ELECTRODE REM PT RTRN 9FT ADLT (ELECTROSURGICAL) ×1 IMPLANT
EVACUATOR SILICONE 100CC (DRAIN) IMPLANT
FILTER STRAW FLUID ASPIR (MISCELLANEOUS) ×1 IMPLANT
GAUZE 4X4 16PLY ~~LOC~~+RFID DBL (SPONGE) ×1 IMPLANT
GAUZE SPONGE 4X4 12PLY STRL (GAUZE/BANDAGES/DRESSINGS) ×1 IMPLANT
GLOVE BIO SURGEON STRL SZ7 (GLOVE) ×1 IMPLANT
GLOVE BIO SURGEON STRL SZ8 (GLOVE) ×1 IMPLANT
GLOVE BIOGEL PI IND STRL 7.0 (GLOVE) ×1 IMPLANT
GLOVE BIOGEL PI IND STRL 8 (GLOVE) ×1 IMPLANT
GLOVE SURG ENC MOIS LTX SZ6.5 (GLOVE) ×1 IMPLANT
GOWN STRL REUS W/ TWL LRG LVL3 (GOWN DISPOSABLE) ×2 IMPLANT
GOWN STRL REUS W/ TWL XL LVL3 (GOWN DISPOSABLE) ×1 IMPLANT
GOWN STRL REUS W/TWL LRG LVL3 (GOWN DISPOSABLE) ×2
GOWN STRL REUS W/TWL XL LVL3 (GOWN DISPOSABLE) ×1
IV CATH 14GX2 1/4 (CATHETERS) ×1 IMPLANT
KIT BASIN OR (CUSTOM PROCEDURE TRAY) ×1 IMPLANT
KIT POSITION SURG JACKSON T1 (MISCELLANEOUS) ×1 IMPLANT
KIT TURNOVER KIT B (KITS) ×1 IMPLANT
MARKER SKIN DUAL TIP RULER LAB (MISCELLANEOUS) ×2 IMPLANT
MIX DBX 10CC 35% BONE (Bone Implant) IMPLANT
NDL 18GX1X1/2 (RX/OR ONLY) (NEEDLE) ×1 IMPLANT
NDL 22X1.5 STRL (OR ONLY) (MISCELLANEOUS) ×2 IMPLANT
NDL HYPO 25GX1X1/2 BEV (NEEDLE) ×1 IMPLANT
NDL SPNL 18GX3.5 QUINCKE PK (NEEDLE) ×2 IMPLANT
NEEDLE 18GX1X1/2 (RX/OR ONLY) (NEEDLE) ×1 IMPLANT
NEEDLE 22X1.5 STRL (OR ONLY) (MISCELLANEOUS) ×2 IMPLANT
NEEDLE HYPO 25GX1X1/2 BEV (NEEDLE) ×1 IMPLANT
NEEDLE SPNL 18GX3.5 QUINCKE PK (NEEDLE) ×2 IMPLANT
NS IRRIG 1000ML POUR BTL (IV SOLUTION) ×1 IMPLANT
PACK LAMINECTOMY ORTHO (CUSTOM PROCEDURE TRAY) ×1 IMPLANT
PACK UNIVERSAL I (CUSTOM PROCEDURE TRAY) ×1 IMPLANT
PAD ARMBOARD 7.5X6 YLW CONV (MISCELLANEOUS) ×2 IMPLANT
PATTIES SURGICAL .5 X1 (DISPOSABLE) ×1 IMPLANT
PATTIES SURGICAL .5X1.5 (GAUZE/BANDAGES/DRESSINGS) ×1 IMPLANT
ROD PRE BENT EXP 40MM (Rod) IMPLANT
ROD PRE BENT EXPEDIUM 35MM (Rod) IMPLANT
SCREW SET SINGLE INNER (Screw) IMPLANT
SCREW VIPER CORT FIX 6.00X30 (Screw) IMPLANT
SOL ANTI FOG 6CC (MISCELLANEOUS) IMPLANT
SPONGE INTESTINAL PEANUT (DISPOSABLE) ×1 IMPLANT
SPONGE SURGIFOAM ABS GEL 100 (HEMOSTASIS) ×1 IMPLANT
STRIP CLOSURE SKIN 1/2X4 (GAUZE/BANDAGES/DRESSINGS) ×2 IMPLANT
SURGIFLO W/THROMBIN 8M KIT (HEMOSTASIS) IMPLANT
SUT MNCRL AB 4-0 PS2 18 (SUTURE) ×1 IMPLANT
SUT VIC AB 0 CT1 18XCR BRD 8 (SUTURE) ×1 IMPLANT
SUT VIC AB 0 CT1 8-18 (SUTURE) ×1
SUT VIC AB 1 CT1 18XCR BRD 8 (SUTURE) ×1 IMPLANT
SUT VIC AB 1 CT1 8-18 (SUTURE) ×1
SUT VIC AB 2-0 CT2 18 VCP726D (SUTURE) ×1 IMPLANT
SYR 20ML LL LF (SYRINGE) ×2 IMPLANT
SYR BULB IRRIG 60ML STRL (SYRINGE) ×1 IMPLANT
SYR CONTROL 10ML LL (SYRINGE) ×2 IMPLANT
SYR TB 1ML LUER SLIP (SYRINGE) ×1 IMPLANT
TAP EXPEDIUM DL 4.35 (INSTRUMENTS) IMPLANT
TAP EXPEDIUM DL 5.0 (INSTRUMENTS) IMPLANT
TAP EXPEDIUM DL 6.0 (INSTRUMENTS) IMPLANT
TRAY FOLEY MTR SLVR 16FR STAT (SET/KITS/TRAYS/PACK) ×1 IMPLANT
TUBE FUNNEL GL DISP (ORTHOPEDIC DISPOSABLE SUPPLIES) IMPLANT
WATER STERILE IRR 1000ML POUR (IV SOLUTION) ×1 IMPLANT
YANKAUER SUCT BULB TIP NO VENT (SUCTIONS) ×1 IMPLANT

## 2022-01-29 NOTE — Transfer of Care (Signed)
Immediate Anesthesia Transfer of Care Note  Patient: Carlos Davidson  Procedure(s) Performed: LEFT SIDED LUMBAR FOUR THROUGH LUMBAR FIVE TRANSFORAMINAL LUMBAR INTERBODY FUSION AND DECOMPRESSION WITH INSTRUMENTATION AND ALLOGRAFT (Left: Spine Lumbar)  Patient Location: PACU  Anesthesia Type:General  Level of Consciousness: awake, alert , oriented, and patient cooperative  Airway & Oxygen Therapy: Patient Spontanous Breathing and Patient connected to nasal cannula oxygen  Post-op Assessment: Report given to RN, Post -op Vital signs reviewed and stable, and Patient moving all extremities X 4  Post vital signs: Reviewed and stable  Last Vitals:  Vitals Value Taken Time  BP 110/58 01/29/22 1106  Temp    Pulse 69 01/29/22 1109  Resp 9 01/29/22 1109  SpO2 95 % 01/29/22 1109  Vitals shown include unvalidated device data.  Last Pain:  Vitals:   01/29/22 0612  TempSrc:   PainSc: 1       Patients Stated Pain Goal: 0 (59/29/24 4628)  Complications: No notable events documented.

## 2022-01-29 NOTE — H&P (Signed)
PREOPERATIVE H&P  Chief Complaint: Bilateral leg pain  HPI: Carlos Davidson is a 82 y.o. male who presents with ongoing pain in the bilateral legs  MRI reveals severe stenosis and instability at L4/5  Patient has failed multiple forms of conservative care and continues to have pain (see office notes for additional details regarding the patient's full course of treatment)  Past Medical History:  Diagnosis Date   Arthritis    CAD (coronary artery disease)    Compression of sciatic nerve    right leg with numbness above knee at times   Depression    Dysrhythmia 01/20/2003   no cardiologist   GERD (gastroesophageal reflux disease)    H/O hiatal hernia    Hemorrhoids    History of kidney stones    Hyperlipidemia    Hypertension    pt unaware 08/18/11   Myocardial infarction ([place])    08/2018   Nephrolithiasis    Neuropathy    feet   Pain, lower leg    burning right leg--STATES TO ELEVATE RIGHT ANKLE   Psoriasis    Seasonal allergies    hay fever   Sleep apnea    STOP BANG SCORE 4   Umbilical hernia    Past Surgical History:  Procedure Laterality Date   CORONARY ARTERY BYPASS GRAFT N/A 09/11/2019   Procedure: CORONARY ARTERY BYPASS GRAFTING (CABG), ON PUMP, TIMES THREE, USING LEFT INTERNAL MAMMARY ARTERY AND ENDOSCOPICALLY HARVESTED RIGHT GREATER SAPHENOUS VEIN;  Surgeon: Carlos Pollack, MD;  Location: [place];  Service: Open Heart Surgery;  Laterality: N/A;   HERNIA REPAIR  08/26/2011   INNER EAR SURGERY  07/24/2009   LEFT HEART CATH AND CORONARY ANGIOGRAPHY N/A 09/07/2019   Procedure: LEFT HEART CATH AND CORONARY ANGIOGRAPHY;  Surgeon: Carlos Mormon, MD;  Location: [facility];  Service: Cardiovascular;  Laterality: N/A;   PROSTATE SURGERY  1998   TURP   TEE WITHOUT CARDIOVERSION N/A 09/11/2019   Procedure: TRANSESOPHAGEAL ECHOCARDIOGRAM (TEE);  Surgeon: Carlos Pollack, MD;  Location: [facility];  Service: Open Heart Surgery;  Laterality: N/A;   UMBILICAL  HERNIA REPAIR  08/26/2011   Procedure: HERNIA REPAIR UMBILICAL ADULT;  Surgeon: Carlos Burn. Georgette Dover, MD;  Location: [facility];  Service: General;  Laterality: N/A;  Umbilical Hernia Repair with Mesh   Social History   Socioeconomic History   Marital status: Married    Spouse name: Carlos Davidson   Number of children: 2   Years of education: Not on file   Highest education level: Not on file  Occupational History   Not on file  Tobacco Use   Smoking status: Never   Smokeless tobacco: Never  Vaping Use   Vaping Use: Never used  Substance and Sexual Activity   Alcohol use: No   Drug use: No   Sexual activity: Not on file  Other Topics Concern   Not on file  Social History Narrative   Not on file   Social Determinants of Health   Financial Resource Strain: Not on file  Food Insecurity: Not on file  Transportation Needs: Not on file  Physical Activity: Not on file  Stress: Not on file  Social Connections: Not on file   Family History  Problem Relation Age of Onset   Cancer Mother        breast   Heart disease Mother    Kidney disease Father    Heart attack Father    Hypertension Father  Heart disease Sister    Heart attack Paternal Grandmother    Cancer - Prostate Paternal Grandfather    Allergies  Allergen Reactions   Aspirin Anaphylaxis   Tetracycline Other (See Comments)    Tightness of chest. Per patient was given in combination with another medication. Not sure of name.   Streptomycin Other (See Comments)    Tightness in chest 1970   Sulfonamide Derivatives Rash    Upper arms and stomach   Prior to Admission medications   Medication Sig Start Date End Date Taking? Authorizing Provider  amLODipine (NORVASC) 5 MG tablet Take 1 tablet (5 mg total) by mouth daily. 06/17/21  Yes Carlos Perla, MD  aspirin 81 MG EC tablet Take 1 tablet (81 mg total) by mouth daily. Swallow whole. 01/10/20  Yes Davidson, Carlos J, MD  atorvastatin (LIPITOR) 40 MG tablet Take 1 tablet (40  mg total) by mouth daily. 06/17/21  Yes Carlos Perla, MD  metoprolol succinate (TOPROL-XL) 25 MG 24 hr tablet Take 1 tablet (25 mg total) by mouth daily. 06/17/21  Yes Carlos Perla, MD  nitroGLYCERIN (NITROSTAT) 0.4 MG SL tablet Place 1 tablet (0.4 mg total) under the tongue every 5 (five) minutes as needed for chest pain. 06/17/21 01/16/24 Yes Carlos Perla, MD  sertraline (ZOLOFT) 50 MG tablet Take 50 mg by mouth daily with breakfast.   Yes [provider]  ibuprofen (ADVIL) 200 MG tablet Take 200 mg by mouth every 8 (eight) hours as needed.    [provider]     All other systems have been reviewed and were otherwise negative with the exception of those mentioned in the HPI and as above.  Physical Exam: Vitals:   01/29/22 0551  BP: 134/68  Pulse: 63  Resp: 18  Temp: 97.8 F (36.6 C)  SpO2: 94%    Body mass index is 26.54 kg/m.  General: Alert, no acute distress Cardiovascular: No pedal edema Respiratory: No cyanosis, no use of accessory musculature Skin: No lesions in the area of chief complaint Neurologic: Sensation intact distally Psychiatric: Patient is competent for consent with normal mood and affect Lymphatic: No axillary or cervical lymphadenopathy   Assessment/Plan: Severe spinal stenosis and instability, L4-L5 Plan for Procedure(s): LEFT-SIDED LUMBAR 4- LUMBAR 5 TRANSFORAMINAL LUMBAR INTERBODY FUSION AND DECOMPRESSION WITH INSTRUMENTATION AND ALLOGRAFT   Carlos Karvonen, MD 01/29/2022 6:36 AM

## 2022-01-29 NOTE — Op Note (Addendum)
PATIENT NAME: DIVON KRABILL   MEDICAL RECORD NO.:   [id number]   DATE OF BIRTH: October 11, 1940   DATE OF PROCEDURE: 01/29/2022                               OPERATIVE REPORT     PREOPERATIVE DIAGNOSES: 1. Bilateral lumbar radiculopathy. 2. L4-5 spinal stenosis. 3. L4-5 spondylolisthesis   POSTOPERATIVE DIAGNOSES: 1. Bilateral lumbar radiculopathy. 2. L4-5 spinal stenosis. 3. L4-5 spondylolisthesis   PROCEDURES: 1. L4/5 decompression 2. Left-sided L4-5 transforaminal lumbar interbody fusion. 3. Right-sided L4-5 posterolateral fusion. 4. Insertion of interbody device x1 (Globus expandable intervertebral spacer). 5. Placement of segmental posterior instrumentation L4, L5 bilaterally  6. Use of local autograft. 7. Use of morselized allograft - DBX-mix 8. Intraoperative use of fluoroscopy.   SURGEON:  Phylliss Bob, MD.   ASSISTANTPricilla Holm, PA-C.   ANESTHESIA:  General endotracheal anesthesia.   COMPLICATIONS:  None.   DISPOSITION:  Stable.   ESTIMATED BLOOD LOSS:  100cc   INDICATIONS FOR SURGERY:  Briefly,  Mr. Gheen is a pleasant 82 year old male who did present to me with severe and ongoing pain and weakness in the right and left legs. I did feel that the symptoms were secondary to the findings noted above.   The patient failed conservative care and did wish to proceed with the procedure noted above.   OPERATIVE DETAILS:  On 01/29/2022, the patient was brought to surgery and general endotracheal anesthesia was administered.  The patient was placed prone on a well-padded flat Jackson bed with a spinal frame.  Antibiotics were given and a time-out procedure was performed. The back was prepped and draped in the usual fashion.  A midline incision was made overlying the L4-5 intervertebral spaces.  The fascia was incised at the midline.  The paraspinal musculature was bluntly swept laterally.  Anatomic landmarks for the pedicles were exposed. Using fluoroscopy, I did  cannulate the L4 and L5 pedicles bilaterally, using a medial to lateral cortical trajectory technique.  At this point, 6 x 30 mm screws were placed into the right pedicles, and a 40 mm rod was placed into the tulip heads of the screw, and caps were also placed.  Distraction was then applied across the L4-5 intervertebral space, and the caps were then provisionally tightened.  On the left side, bone wax was placed into the cannulated pedicle holes.  I then proceeded with the decompressive aspect of the procedure at the L4-5 level.  A partial facetectomy was performed bilaterally at L4-5, decompressing the L4-5 intervertebral space.  I was very pleased with the decompression. With an assistant holding medial retraction of the traversing left L5 nerve, I did perform an annulotomy at the posterolateral aspect of the L4-5 intervertebral space.  I then used a series of curettes and pituitary rongeurs to perform a thorough and complete intervertebral diskectomy.  The intervertebral space was then liberally packed with autograft as well as allograft in the form of DBX-mix, as was the appropriate-sized intervertebral spacer.  The spacer was then tamped into position in the usual fashion, and expanded to 11.4 mm in height. I was very pleased with the press-fit of the spacer.  I then placed 6 mm screws on the left at L4 and L5. A 35-mm rod was then placed and caps were placed. The distraction was then released on the contralateral side.  All caps were then locked.  The wound was copiously  irrigated with a total of approximately 3 L prior to placing the bone graft.  Additional autograft and allograft was then packed into the posterolateral gutter on the right side to help aid in the success of the fusion.  The wound was  explored for any undue bleeding and there was no substantial bleeding encountered.  Gel-Foam was placed over the laminectomy site.  The wound was then closed in layers using #1 Vicryl followed  by 2-0 Vicryl, followed by 4-0 Monocryl.  Benzoin and Steri-Strips were applied followed by sterile dressing.     Of note, Pricilla Holm was my assistant throughout surgery, and did aid in retraction, suctioning, the decompression, placement of the hardware, and closure.       Phylliss Bob, MD

## 2022-01-29 NOTE — Anesthesia Postprocedure Evaluation (Signed)
Anesthesia Post Note  Patient: Carlos Davidson  Procedure(s) Performed: LEFT SIDED LUMBAR FOUR THROUGH LUMBAR FIVE TRANSFORAMINAL LUMBAR INTERBODY FUSION AND DECOMPRESSION WITH INSTRUMENTATION AND ALLOGRAFT (Left: Spine Lumbar)     Patient location during evaluation: PACU Anesthesia Type: General Level of consciousness: awake and alert Pain management: pain level controlled Vital Signs Assessment: post-procedure vital signs reviewed and stable Respiratory status: spontaneous breathing, nonlabored ventilation, respiratory function stable and patient connected to nasal cannula oxygen Cardiovascular status: blood pressure returned to baseline and stable Postop Assessment: no apparent nausea or vomiting Anesthetic complications: no   No notable events documented.  Last Vitals:  Vitals:   01/29/22 1130 01/29/22 1145  BP: 108/64 118/60  Pulse: 67 64  Resp: 11 18  Temp:    SpO2: 93% 92%                  Audry Pili

## 2022-01-29 NOTE — Anesthesia Procedure Notes (Signed)
Procedure Name: Intubation Date/Time: 01/29/2022 7:50 AM  Performed by: Michele Rockers, CRNAPre-anesthesia Checklist: Patient identified, Patient being monitored, Timeout performed, Emergency Drugs available and Suction available Patient Re-evaluated:Patient Re-evaluated prior to induction Oxygen Delivery Method: Circle System Utilized Preoxygenation: Pre-oxygenation with 100% oxygen Induction Type: IV induction Ventilation: Mask ventilation without difficulty Laryngoscope Size: Miller and 2 Grade View: Grade II Tube type: Oral Tube size: 7.5 mm Number of attempts: 1 Airway Equipment and Method: Stylet Placement Confirmation: ETT inserted through vocal cords under direct vision, positive ETCO2 and breath sounds checked- equal and bilateral Secured at: 22 cm Tube secured with: Tape Dental Injury: Teeth and Oropharynx as per pre-operative assessment

## 2022-01-30 DIAGNOSIS — Z7982 Long term (current) use of aspirin: Secondary | ICD-10-CM | POA: Diagnosis not present

## 2022-01-30 DIAGNOSIS — Z951 Presence of aortocoronary bypass graft: Secondary | ICD-10-CM | POA: Diagnosis not present

## 2022-01-30 DIAGNOSIS — I2510 Atherosclerotic heart disease of native coronary artery without angina pectoris: Secondary | ICD-10-CM | POA: Diagnosis not present

## 2022-01-30 DIAGNOSIS — M5416 Radiculopathy, lumbar region: Secondary | ICD-10-CM | POA: Diagnosis not present

## 2022-01-30 DIAGNOSIS — M4316 Spondylolisthesis, lumbar region: Secondary | ICD-10-CM | POA: Diagnosis not present

## 2022-01-30 DIAGNOSIS — M48061 Spinal stenosis, lumbar region without neurogenic claudication: Secondary | ICD-10-CM | POA: Diagnosis not present

## 2022-01-30 DIAGNOSIS — Z79899 Other long term (current) drug therapy: Secondary | ICD-10-CM | POA: Diagnosis not present

## 2022-01-30 NOTE — Progress Notes (Signed)
    Patient doing well  Denies leg pain   Physical Exam: Vitals:   01/30/22 0407 01/30/22 0753  BP: 135/67 (!) 101/48  Pulse: (!) 59 71  Resp: 20 18  Temp: 97.8 F (36.6 C)   SpO2: 96% 97%   Patient looks excellent Dressing in place NVI  POD #1 s/p L4/5 decompression and fusion, doing well  - up with PT/OT, encourage ambulation - Percocet for pain, Robaxin for muscle spasms - d/c home today with f/u in 2 weeks

## 2022-01-30 NOTE — Evaluation (Signed)
Physical Therapy Evaluation  Patient Details Name: Carlos Davidson MRN: [id number] DOB: [birth year] Today's Date: 01/30/2022  History of Present Illness  Pt is a 82 y.o. 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 Pt is now s/p L sided lumbar interbody fusion and decompression with instrumentation and allograft. PMH significant for arthritis, CAD, dysrhythmia, GERD, HLD, HTN, MI, nephrolithiasis, neuropathy, psoriasis, and sleep apnea.   Clinical Impression  Pt admitted with above diagnosis. At the time of PT eval, pt was able to demonstrate transfers and ambulation with gross min guard assist to supervision for safety and RW for support. Pt was educated on precautions, brace application/wearing schedule, appropriate activity progression, and car transfer. Pt currently with functional limitations due to the deficits listed below (see PT Problem List). Pt will benefit from skilled PT to increase their independence and safety with mobility to allow discharge to the venue listed below.         Recommendations for follow up therapy are one component of a multi-disciplinary discharge planning process, led by the attending physician.  Recommendations may be updated based on patient status, additional functional criteria and insurance authorization.  Follow Up Recommendations No PT follow up      Assistance Recommended at Discharge PRN  Patient can return home with the following  A little help with walking and/or transfers;A little help with bathing/dressing/bathroom;Assistance with cooking/housework;Assist for transportation;Help with stairs or ramp for entrance    Equipment Recommendations Rolling walker (2 wheels);BSC/3in1  Recommendations for Other Services       Functional Status Assessment Patient has had a recent decline in their functional status and demonstrates the ability to make significant improvements in function in a reasonable and predictable amount of time.      Precautions / Restrictions Precautions Precautions: Back Precaution Booklet Issued: Yes (comment) Precaution Comments: All precautions reviewed within the context of ADL Required Braces or Orthoses: Spinal Brace Spinal Brace: Thoracolumbosacral orthotic;Applied in sitting position;Other (comment) Spinal Brace Comments: All times except bathing and night time trips to restroom Restrictions Weight Bearing Restrictions: No      Mobility  Bed Mobility Overal bed mobility: Needs Assistance Bed Mobility: Rolling, Sidelying to Sit Rolling: Supervision Sidelying to sit: Supervision       General bed mobility comments: HOB flat and rails lowered to simulate home environment. No assist required but VC's throughout for optimal log roll technique.    Transfers Overall transfer level: Needs assistance Equipment used: Rolling walker (2 wheels) Transfers: Sit to/from Stand Sit to Stand: Min guard           General transfer comment: VC's for hand placement on seated surface for safety. Pt able to maintain upright posture fairly well with transition to/from standing.    Ambulation/Gait Ambulation/Gait assistance: Min guard Gait Distance (Feet): 200 Feet Assistive device: Rolling walker (2 wheels) Gait Pattern/deviations: Step-through pattern, Decreased stride length, Trunk flexed, Narrow base of support Gait velocity: Decreased Gait velocity interpretation: 1.31 - 2.62 ft/sec, indicative of limited community ambulator   General Gait Details: VC's for improved posture, closer walker proximity, and forward gaze. No assist required but hands on guarding provided for safety.  Stairs Stairs: Yes Stairs assistance: Min guard Stair Management: One rail Right, Step to pattern, Forwards Number of Stairs: 5 General stair comments: VC's for sequencing and general safety.  Wheelchair Mobility    Modified Rankin (Stroke Patients Only)       Balance Overall balance assessment:  Mild deficits observed, not formally  tested                                           Pertinent Vitals/Pain Pain Assessment Pain Assessment: Faces Faces Pain Scale: Hurts a little bit Pain Location: operative site Pain Descriptors / Indicators: Operative site guarding, Sore Pain Intervention(s): Limited activity within patient's tolerance, Monitored during session, Repositioned    Home Living Family/patient expects to be discharged to:: Private residence Living Arrangements: Spouse/significant other Available Help at Discharge: Family;Available 24 hours/day (Pt's son to stay for several days and then daughter to stay for several days. Daughter present in session) Type of Home: House Home Access: Stairs to enter   [employer] of Steps: 3 in front with no rail and 5 in back with rail on R. Alternate Level Stairs-Number of Steps: flight Home Layout: Two level;Able to live on main level with bedroom/bathroom (bonus room and man cave upstairs) Home Equipment: Conservation officer, nature (2 wheels) Additional Comments: wife and daughter present    Prior Function Prior Level of Function : Independent/Modified Independent;Driving             Mobility Comments: no AD ADLs Comments: no assist with ADL at baseline     Hand Dominance        Extremity/Trunk Assessment   Upper Extremity Assessment Upper Extremity Assessment: Defer to OT evaluation    Lower Extremity Assessment Lower Extremity Assessment: Generalized weakness    Cervical / Trunk Assessment Cervical / Trunk Assessment: Back Surgery  Communication   Communication: No difficulties  Cognition Arousal/Alertness: Awake/alert Behavior During Therapy: WFL for tasks assessed/performed Overall Cognitive Status: Impaired/Different from baseline Area of Impairment: Memory                     Memory: Decreased recall of precautions                  General Comments General comments  (skin integrity, edema, etc.): VSS. Wife and daughter present and active in education    Exercises     Assessment/Plan    PT Assessment Patient needs continued PT services  PT Problem List Decreased strength;Decreased range of motion;Decreased activity tolerance;Decreased balance;Decreased mobility;Decreased knowledge of use of DME;Decreased safety awareness;Decreased knowledge of precautions;Pain       PT Treatment Interventions DME instruction;Gait training;Stair training;Functional mobility training;Therapeutic activities;Therapeutic exercise;Balance training;Patient/family education    PT Goals (Current goals can be found in the Care Plan section)  Acute Rehab PT Goals Patient Stated Goal: Decreased PT Goal Formulation: With patient/family Time For Goal Achievement: 02/06/22 Potential to Achieve Goals: Good    Frequency Min 5X/week     Co-evaluation               AM-PAC PT "6 Clicks" Mobility  Outcome Measure Help needed turning from your back to your side while in a flat bed without using bedrails?: A Little Help needed moving from lying on your back to sitting on the side of a flat bed without using bedrails?: A Little Help needed moving to and from a bed to a chair (including a wheelchair)?: A Little Help needed standing up from a chair using your arms (e.g., wheelchair or bedside chair)?: A Little Help needed to walk in hospital room?: A Little Help needed climbing 3-5 steps with a railing? : A Little 6 Click Score: 18    End of Session Equipment  Utilized During Treatment: Gait belt Activity Tolerance: Patient tolerated treatment well Patient left: in bed;with call bell/phone within reach Nurse Communication: Mobility status PT Visit Diagnosis: Unsteadiness on feet (R26.81);Pain    Time: 0931-1002 PT Time Calculation (min) (ACUTE ONLY): 31 min   Charges:   PT Evaluation $PT Eval Low Complexity: 1 Low PT Treatments $Gait Training: 8-22 mins         Rolinda Roan, PT, DPT Acute Rehabilitation Services Secure Chat Preferred Office: [phone]   Thelma Comp 01/30/2022, 11:24 AM

## 2022-01-30 NOTE — Progress Notes (Signed)
Patient alert and oriented, mae's well, voiding adequate amount of urine, swallowing without difficulty, no c/o pain at time of discharge. Patient discharged home with family. Script and discharged instructions given to patient. Patient and family stated understanding of instructions given. Patient has an appointment with Dr. Lynann Bologna in 2 weeks

## 2022-01-30 NOTE — Evaluation (Signed)
Occupational Therapy Evaluation Patient Details Name: Carlos Davidson MRN: [id number] DOB: [birth year] Today's Date: 01/30/2022   History of Present Illness Pt is a 82 y.o. 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 Pt is now s/p L sided lumbar interbody fusion and decompression with instrumentation and allograft. PMH significant for arthritis, CAD, dysrhythmia, GERD, HLD, HTN, MI, nephrolithiasis, neuropathy, psoriasis, and sleep apnea.   Clinical Impression   PTA, pt lived with wife and was independent. Pt's son to stay several days, followed by daughter to stay for several days to ensure maintenance of precautions and safety. Upon eval, pt performing UB ADL with up to mod A and step by step cues for brace application. Performing LB ADL with min guard A and up to mod cues to avoid bringing knee to chest; educated re: AE to decrease likelihood of breaking spinal precautions, and pt with greater adherence. Daughter and wife present and indep cueing throughout as well (daughter also ordered AE). Pt educated and demonstrating use of compensatory techniques/AE for LB dressing, bed mobility, brace application, grooming, toileting, and shower transfer within precautions. Recommending discharge home with no follow up OT.      Recommendations for follow up therapy are one component of a multi-disciplinary discharge planning process, led by the attending physician.  Recommendations may be updated based on patient status, additional functional criteria and insurance authorization.   Follow Up Recommendations  No OT follow up     Assistance Recommended at Discharge Intermittent Supervision/Assistance  Patient can return home with the following A little help with walking and/or transfers;A little help with bathing/dressing/bathroom;Assistance with cooking/housework;Assist for transportation;Help with stairs or ramp for entrance    Functional Status Assessment  Patient has had a  recent decline in their functional status and demonstrates the ability to make significant improvements in function in a reasonable and predictable amount of time.  Equipment Recommendations  BSC/3in1    Recommendations for Other Services PT consult     Precautions / Restrictions Precautions Precautions: Back Precaution Booklet Issued: Yes (comment) Precaution Comments: All precautions reviewed within the context of ADL Required Braces or Orthoses: Spinal Brace Spinal Brace: Thoracolumbosacral orthotic;Applied in sitting position;Other (comment) Spinal Brace Comments: All times except bathing and night time trips to restroom Restrictions Weight Bearing Restrictions: No      Mobility Bed Mobility Overal bed mobility: Needs Assistance Bed Mobility: Rolling, Sidelying to Sit, Sit to Sidelying Rolling: Supervision Sidelying to sit: Supervision     Sit to sidelying: Supervision General bed mobility comments: overall good use of log roll technique, 1-2 cues for optimal technique and positioning    Transfers Overall transfer level: Needs assistance Equipment used: Rolling walker (2 wheels) Transfers: Sit to/from Stand Sit to Stand: Min guard           General transfer comment: Min guard A for safety      Balance Overall balance assessment: Mild deficits observed, not formally tested                                         ADL either performed or assessed with clinical judgement   ADL Overall ADL's : Needs assistance/impaired Eating/Feeding: Independent;Sitting   Grooming: Supervision/safety;Standing;Min guard;Cueing for compensatory techniques   Upper Body Bathing: Set up;Sitting Upper Body Bathing Details (indicate cue type and reason): Mod A and nearly step by step cues for brace application Lower  Body Bathing: Min guard;Sit to/from stand   Upper Body Dressing : Set up;Moderate assistance;Sitting Upper Body Dressing Details (indicate cue type  and reason): Mod A and nearly step by step cues for brace application Lower Body Dressing: Min guard;Sit to/from stand;Cueing for back precautions;With adaptive equipment Lower Body Dressing Details (indicate cue type and reason): AE due to poor adherence to figure 4 and max attempts to bring knee to chest Toilet Transfer: Min guard;Ambulation;Rolling walker (2 wheels)     Toileting - Clothing Manipulation Details (indicate cue type and reason): educated regarding compensatory techniques Tub/ Shower Transfer: Walk-in shower;Min guard;Adhering to back precautions;Cueing for sequencing;Rolling walker (2 wheels) Tub/Shower Transfer Details (indicate cue type and reason): min guard and cues for new learning Functional mobility during ADLs: Min guard;Rolling walker (2 wheels);Supervision/safety General ADL Comments: needed cues throughout     Vision Ability to See in Adequate Light: 0 Adequate Patient Visual Report: No change from baseline Vision Assessment?: No apparent visual deficits     Perception     Praxis      Pertinent Vitals/Pain Pain Assessment Pain Assessment: Faces Faces Pain Scale: Hurts a little bit Pain Location: operative site Pain Descriptors / Indicators: Operative site guarding, Sore Pain Intervention(s): Limited activity within patient's tolerance, Monitored during session     Hand Dominance     Extremity/Trunk Assessment Upper Extremity Assessment Upper Extremity Assessment: Overall WFL for tasks assessed   Lower Extremity Assessment Lower Extremity Assessment: Defer to PT evaluation   Cervical / Trunk Assessment Cervical / Trunk Assessment: Back Surgery   Communication Communication Communication: No difficulties   Cognition Arousal/Alertness: Awake/alert Behavior During Therapy: WFL for tasks assessed/performed Overall Cognitive Status: Impaired/Different from baseline Area of Impairment: Memory                     Memory: Decreased recall  of precautions         General Comments: up to mod cues to maintain precautions during session. Pt's daughter who will stay with him and wife for several days indep cueing throughout     General Comments  VSS. Wife and daughter present and active in education    Exercises     Shoulder Instructions      Home Living Family/patient expects to be discharged to:: Private residence Living Arrangements: Spouse/significant other Available Help at Discharge: Family;Available 24 hours/day (Pt's son to stay for several days and then daughter to stay for several days. Daughter present in session) Type of Home: House Home Access: Stairs to enter [employer] of Steps: 3 in front with no rail and 4 in back with rail on R.   Home Layout: Two level;Able to live on main level with bedroom/bathroom (bonus room and man cave upstairs) Alternate Level Stairs-Number of Steps: flight Alternate Level Stairs-Rails: Right Bathroom Shower/Tub: Occupational psychologist: Handicapped height Bathroom Accessibility: Yes How Accessible: Accessible via walker Home Equipment: Conservation officer, nature (2 wheels)   Additional Comments: wife and daughter present      Prior Functioning/Environment Prior Level of Function : Independent/Modified Independent;Driving             Mobility Comments: no AD ADLs Comments: no assist with ADL at baseline        OT Problem List: Decreased strength;Decreased activity tolerance;Impaired balance (sitting and/or standing);Decreased safety awareness;Decreased knowledge of use of DME or AE;Decreased knowledge of precautions;Pain      OT Treatment/Interventions: Self-care/ADL training;Therapeutic exercise;DME and/or AE instruction;Cognitive remediation/compensation;Patient/family education;Balance training;Therapeutic activities  OT Goals(Current goals can be found in the care plan section) Acute Rehab OT Goals Patient Stated Goal: go home OT Goal  Formulation: With patient Time For Goal Achievement: 02/13/22 Potential to Achieve Goals: Good  OT Frequency: Min 2X/week    Co-evaluation              AM-PAC OT "6 Clicks" Daily Activity     Outcome Measure Help from another person eating meals?: None Help from another person taking care of personal grooming?: A Little Help from another person toileting, which includes using toliet, bedpan, or urinal?: A Little Help from another person bathing (including washing, rinsing, drying)?: A Little Help from another person to put on and taking off regular upper body clothing?: A Little Help from another person to put on and taking off regular lower body clothing?: A Little 6 Click Score: 19   End of Session Equipment Utilized During Treatment: Gait belt;Rolling walker (2 wheels);Back brace Nurse Communication: Mobility status  Activity Tolerance: Patient tolerated treatment well Patient left: in bed;with call bell/phone within reach;with family/visitor present  OT Visit Diagnosis: Unsteadiness on feet (R26.81);Muscle weakness (generalized) (M62.81);Other symptoms and signs involving cognitive function;Pain Pain - part of body:  (back)                Time: 6812-7517 OT Time Calculation (min): 43 min Charges:  OT General Charges $OT Visit: 1 Visit OT Evaluation $OT Eval Low Complexity: 1 Low OT Treatments $Self Care/Home Management : 23-37 mins  Elder Cyphers, OTR/L [facility] Acute Rehabilitation Office: [phone]   Magnus Ivan 01/30/2022, 9:09 AM

## 2022-02-04 NOTE — Discharge Summary (Signed)
[name] MRN: [id number] DOB/AGE: Apr 28, 1940 82 y.o.  Admit date: 01/29/2022 Discharge date: 01/30/2022  Admission Diagnoses:  Principal Problem:   Radiculopathy, lumbar region   Discharge Diagnoses:  Same  Past Medical History:  Diagnosis Date   Arthritis    CAD (coronary artery disease)    Compression of sciatic nerve    right leg with numbness above knee at times   Depression    Dysrhythmia 01/20/2003   no cardiologist   GERD (gastroesophageal reflux disease)    H/O hiatal hernia    Hemorrhoids    History of kidney stones    Hyperlipidemia    Hypertension    pt unaware 08/18/11   Myocardial infarction ([place])    08/2018   Nephrolithiasis    Neuropathy    feet   Pain, lower leg    burning right leg--STATES TO ELEVATE RIGHT ANKLE   Psoriasis    Seasonal allergies    hay fever   Sleep apnea    STOP BANG SCORE 4   Umbilical hernia     Surgeries: Procedure(s): LEFT SIDED LUMBAR FOUR THROUGH LUMBAR FIVE TRANSFORAMINAL LUMBAR INTERBODY FUSION AND DECOMPRESSION WITH INSTRUMENTATION AND ALLOGRAFT on 01/29/2022   Consultants: None  Discharged Condition: Improved  Hospital Course: Carlos Davidson is an 82 y.o. male who was admitted 01/29/2022 for operative treatment of Radiculopathy, lumbar region. Patient has severe unremitting pain that affects sleep, daily activities, and work/hobbies. After pre-op clearance the patient was taken to the operating room on 01/29/2022 and underwent  Procedure(s): LEFT SIDED LUMBAR FOUR THROUGH LUMBAR FIVE TRANSFORAMINAL LUMBAR INTERBODY FUSION AND DECOMPRESSION WITH INSTRUMENTATION AND ALLOGRAFT.    Patient was given perioperative antibiotics:  Anti-infectives (From admission, onward)    Start     Dose/Rate Route Frequency Ordered Stop   01/29/22 1600  ceFAZolin (ANCEF) IVPB 2g/100 mL premix        2 g 200 mL/hr over 30 Minutes Intravenous Every 8 hours 01/29/22 1312 01/29/22 2300   01/29/22 0600  ceFAZolin (ANCEF)  IVPB 2g/100 mL premix        2 g 200 mL/hr over 30 Minutes Intravenous On call to O.R. 01/29/22 9242 01/29/22 6834        Patient was given sequential compression devices, early ambulation to prevent DVT.  Patient benefited maximally from hospital stay and there were no complications.    Recent vital signs: BP (!) 101/48 (BP Location: Left Arm)   Pulse 71   Temp 97.8 F (36.6 C) (Oral)   Resp 18   Ht '5\' 10"'$  (1.778 m)   Wt 83.9 kg   SpO2 97%   BMI 26.54 kg/m    Discharge Medications:   Allergies as of 01/30/2022       Reactions   Aspirin Anaphylaxis   Tetracycline Other (See Comments)   Tightness of chest. Per patient was given in combination with another medication. Not sure of name.   Streptomycin Other (See Comments)   Tightness in chest 1970   Sulfonamide Derivatives Rash   Upper arms and stomach        Medication List     TAKE these medications    amLODipine 5 MG tablet Commonly known as: NORVASC Take 1 tablet (5 mg total) by mouth daily.   atorvastatin 40 MG tablet Commonly known as: LIPITOR Take 1 tablet (40 mg total) by mouth daily.   methocarbamol 500 MG tablet Commonly known as: ROBAXIN Take 1 tablet (500 mg total) by  mouth every 6 (six) hours as needed for muscle spasms.   metoprolol succinate 25 MG 24 hr tablet Commonly known as: TOPROL-XL Take 1 tablet (25 mg total) by mouth daily.   nitroGLYCERIN 0.4 MG SL tablet Commonly known as: NITROSTAT Place 1 tablet (0.4 mg total) under the tongue every 5 (five) minutes as needed for chest pain.   oxyCODONE-acetaminophen 5-325 MG tablet Commonly known as: PERCOCET/ROXICET Take 1-2 tablets by mouth every 4 (four) hours as needed for severe pain.   sertraline 50 MG tablet Commonly known as: ZOLOFT Take 50 mg by mouth daily with breakfast.        Diagnostic Studies: DG Lumbar Spine 1 View  Result Date: 01/29/2022 CLINICAL DATA:  Cross-table lateral view for intraoperative localization EXAM:  LUMBAR SPINE - 1 VIEW COMPARISON:  07/28/2013 FINDINGS: Portable cross-table lateral view of lumbar spine was done for intraoperative localization. Markers are noted in the posterior aspect at the level of L4-L5 disc space and body of L3 vertebra. IMPRESSION: Portable cross-table lateral view of lumbar spine was done for intraoperative localization. Electronically Signed   By: Elmer Picker M.D.   On: 01/29/2022 10:48   DG Lumbar Spine 2-3 Views  Result Date: 01/29/2022 CLINICAL DATA:  Fluoroscopic assistance for lumbar spinal fusion EXAM: LUMBAR SPINE - 2-3 VIEW COMPARISON:  07/28/2013 FINDINGS: Fluoroscopic images show posterior surgical fusion at the L4-L5 level. Intervertebral disc spacer is noted in place. Fluoroscopy time 41.9 seconds. Radiation dose 19.74 mGy. IMPRESSION: Fluoroscopic assistance was provided for posterior lumbar fusion at L4-L5 level. Electronically Signed   By: Elmer Picker M.D.   On: 01/29/2022 10:47   DG C-Arm 1-60 Min-No Report  Result Date: 01/29/2022 Fluoroscopy was utilized by the requesting physician.  No radiographic interpretation.   DG C-Arm 1-60 Min-No Report  Result Date: 01/29/2022 Fluoroscopy was utilized by the requesting physician.  No radiographic interpretation.   DG C-Arm 1-60 Min-No Report  Result Date: 01/29/2022 Fluoroscopy was utilized by the requesting physician.  No radiographic interpretation.    Disposition: Discharge disposition: 01-Home or Self Care        POD #1 s/p L4/5 decompression and fusion, doing well   - up with PT/OT, encourage ambulation - Percocet for pain, Robaxin for muscle spasms -Scripts for pain sent to pharmacy electronically  -D/C instructions sheet printed and in chart -D/C today  -F/U in office 2 weeks   Signed: Justice Britain 02/04/2022, 12:38 PM

## 2022-02-09 ENCOUNTER — Encounter (HOSPITAL_COMMUNITY): Payer: Self-pay | Admitting: Orthopedic Surgery

## 2022-02-11 DIAGNOSIS — M4316 Spondylolisthesis, lumbar region: Secondary | ICD-10-CM | POA: Diagnosis not present

## 2022-03-13 DIAGNOSIS — M4316 Spondylolisthesis, lumbar region: Secondary | ICD-10-CM | POA: Diagnosis not present

## 2022-04-13 DIAGNOSIS — M4316 Spondylolisthesis, lumbar region: Secondary | ICD-10-CM | POA: Diagnosis not present

## 2022-04-20 DIAGNOSIS — R531 Weakness: Secondary | ICD-10-CM | POA: Diagnosis not present

## 2022-04-20 DIAGNOSIS — M5451 Vertebrogenic low back pain: Secondary | ICD-10-CM | POA: Diagnosis not present

## 2022-04-20 DIAGNOSIS — M47896 Other spondylosis, lumbar region: Secondary | ICD-10-CM | POA: Diagnosis not present

## 2022-04-27 DIAGNOSIS — M5451 Vertebrogenic low back pain: Secondary | ICD-10-CM | POA: Diagnosis not present

## 2022-04-27 DIAGNOSIS — M47896 Other spondylosis, lumbar region: Secondary | ICD-10-CM | POA: Diagnosis not present

## 2022-04-27 DIAGNOSIS — R531 Weakness: Secondary | ICD-10-CM | POA: Diagnosis not present

## 2022-04-29 DIAGNOSIS — E785 Hyperlipidemia, unspecified: Secondary | ICD-10-CM | POA: Diagnosis not present

## 2022-04-29 DIAGNOSIS — R600 Localized edema: Secondary | ICD-10-CM | POA: Diagnosis not present

## 2022-04-29 DIAGNOSIS — R69 Illness, unspecified: Secondary | ICD-10-CM | POA: Diagnosis not present

## 2022-04-29 DIAGNOSIS — G629 Polyneuropathy, unspecified: Secondary | ICD-10-CM | POA: Diagnosis not present

## 2022-04-29 DIAGNOSIS — I25119 Atherosclerotic heart disease of native coronary artery with unspecified angina pectoris: Secondary | ICD-10-CM | POA: Diagnosis not present

## 2022-04-29 DIAGNOSIS — Z0000 Encounter for general adult medical examination without abnormal findings: Secondary | ICD-10-CM | POA: Diagnosis not present

## 2022-04-29 DIAGNOSIS — I10 Essential (primary) hypertension: Secondary | ICD-10-CM | POA: Diagnosis not present

## 2022-05-04 DIAGNOSIS — M47896 Other spondylosis, lumbar region: Secondary | ICD-10-CM | POA: Diagnosis not present

## 2022-05-04 DIAGNOSIS — R531 Weakness: Secondary | ICD-10-CM | POA: Diagnosis not present

## 2022-05-04 DIAGNOSIS — M5451 Vertebrogenic low back pain: Secondary | ICD-10-CM | POA: Diagnosis not present

## 2022-05-11 DIAGNOSIS — M47896 Other spondylosis, lumbar region: Secondary | ICD-10-CM | POA: Diagnosis not present

## 2022-05-11 DIAGNOSIS — M5451 Vertebrogenic low back pain: Secondary | ICD-10-CM | POA: Diagnosis not present

## 2022-05-11 DIAGNOSIS — R531 Weakness: Secondary | ICD-10-CM | POA: Diagnosis not present

## 2022-07-13 DIAGNOSIS — M545 Low back pain, unspecified: Secondary | ICD-10-CM | POA: Diagnosis not present

## 2022-07-21 ENCOUNTER — Other Ambulatory Visit: Payer: Self-pay

## 2022-07-21 DIAGNOSIS — I2510 Atherosclerotic heart disease of native coronary artery without angina pectoris: Secondary | ICD-10-CM

## 2022-07-21 MED ORDER — METOPROLOL SUCCINATE ER 25 MG PO TB24: 25.0000 mg | ORAL_TABLET | Freq: Every day | ORAL | 1 refills | Status: DC

## 2022-08-04 NOTE — Progress Notes (Signed)
HPI: FU coronary artery disease.  Previously followed by Dr. Rosemary Holms.  Admitted with non-ST elevation myocardial infarction August 2021. Cardiac catheterization revealed a 90% ostial LAD and 90% OM1.  Echocardiogram revealed normal LV function.  Preoperative carotid Doppler showed 1 to 39% bilateral stenosis.  Patient had coronary artery bypass and graft with a LIMA to the LAD, saphenous vein graft to the diagonal and saphenous vein graft to the obtuse marginal.  Monitor July 2022 showed sinus rhythm with PAT, PACs, PVCs and no atrial fibrillation was noted.  2 patient events correlated with sinus by report. Since last seen, the patient denies any dyspnea on exertion, orthopnea, PND, pedal edema, palpitations, syncope or chest pain.   Current Outpatient Medications  Medication Sig Dispense Refill   amLODipine (NORVASC) 5 MG tablet TAKE 1 TABLET BY MOUTH DAILY 90 tablet 3   atorvastatin (LIPITOR) 40 MG tablet Take 1 tablet (40 mg total) by mouth daily. 90 tablet 3   metoprolol succinate (TOPROL-XL) 25 MG 24 hr tablet Take 1 tablet (25 mg total) by mouth daily. 90 tablet 1   nitroGLYCERIN (NITROSTAT) 0.4 MG SL tablet Place 1 tablet (0.4 mg total) under the tongue every 5 (five) minutes as needed for chest pain. 25 tablet 6   sertraline (ZOLOFT) 50 MG tablet Take 50 mg by mouth daily with breakfast.     methocarbamol (ROBAXIN) 500 MG tablet Take 1 tablet (500 mg total) by mouth every 6 (six) hours as needed for muscle spasms. (Patient not taking: Reported on 08/12/2022) 30 tablet 2   oxyCODONE-acetaminophen (PERCOCET/ROXICET) 5-325 MG tablet Take 1-2 tablets by mouth every 4 (four) hours as needed for severe pain. (Patient not taking: Reported on 08/12/2022) 30 tablet 0   No current facility-administered medications for this visit.     Past Medical History:  Diagnosis Date   Arthritis    CAD (coronary artery disease)    Compression of sciatic nerve    right leg with numbness above knee at  times   Depression    Dysrhythmia 01/20/2003   no cardiologist   GERD (gastroesophageal reflux disease)    H/O hiatal hernia    Hemorrhoids    History of kidney stones    Hyperlipidemia    Hypertension    pt unaware 08/18/11   Myocardial infarction ([facility])    08/2018   Nephrolithiasis    Neuropathy    feet   Pain, lower leg    burning right leg--STATES TO ELEVATE RIGHT ANKLE   Psoriasis    Seasonal allergies    hay fever   Sleep apnea    STOP BANG SCORE 4   Umbilical hernia     Past Surgical History:  Procedure Laterality Date   CORONARY ARTERY BYPASS GRAFT N/A 09/11/2019   Procedure: CORONARY ARTERY BYPASS GRAFTING (CABG), ON PUMP, TIMES THREE, USING LEFT INTERNAL MAMMARY ARTERY AND ENDOSCOPICALLY HARVESTED RIGHT GREATER SAPHENOUS VEIN;  Surgeon: Alleen Borne, MD;  Location: [facility] OR;  Service: Open Heart Surgery;  Laterality: N/A;   HERNIA REPAIR  08/26/2011   INNER EAR SURGERY  07/24/2009   LEFT HEART CATH AND CORONARY ANGIOGRAPHY N/A 09/07/2019   Procedure: LEFT HEART CATH AND CORONARY ANGIOGRAPHY;  Surgeon: Elder Negus, MD;  Location: [facility] INVASIVE CV LAB;  Service: Cardiovascular;  Laterality: N/A;   PROSTATE SURGERY  1998   TURP   TEE WITHOUT CARDIOVERSION N/A 09/11/2019   Procedure: TRANSESOPHAGEAL ECHOCARDIOGRAM (TEE);  Surgeon: Alleen Borne, MD;  Location: [facility]  OR;  Service: Open Heart Surgery;  Laterality: N/A;   TRANSFORAMINAL LUMBAR INTERBODY FUSION (TLIF) WITH PEDICLE SCREW FIXATION 1 LEVEL Left 01/29/2022   Procedure: LEFT SIDED LUMBAR FOUR THROUGH LUMBAR FIVE TRANSFORAMINAL LUMBAR INTERBODY FUSION AND DECOMPRESSION WITH INSTRUMENTATION AND ALLOGRAFT;  Surgeon: Estill Bamberg, MD;  Location: [facility] OR;  Service: Orthopedics;  Laterality: Left;   UMBILICAL HERNIA REPAIR  08/26/2011   Procedure: HERNIA REPAIR UMBILICAL ADULT;  Surgeon: [name]. [name], MD;  Location: [facility] ORS;  Service: General;  Laterality: N/A;  Umbilical Hernia Repair with Mesh    Social History    Socioeconomic History   Marital status: Married    Spouse name: Nettie Elm   Number of children: 2   Years of education: Not on file   Highest education level: Not on file  Occupational History   Not on file  Tobacco Use   Smoking status: Never   Smokeless tobacco: Never  Vaping Use   Vaping status: Never Used  Substance and Sexual Activity   Alcohol use: No   Drug use: No   Sexual activity: Not on file  Other Topics Concern   Not on file  Social History Narrative   Not on file   Social Determinants of Health   Financial Resource Strain: Not on file  Food Insecurity: Not on file  Transportation Needs: Not on file  Physical Activity: Not on file  Stress: Not on file  Social Connections: Not on file  Intimate Partner Violence: Not on file    Family History  Problem Relation Age of Onset   Cancer Mother        breast   Heart disease Mother    Kidney disease Father    Heart attack Father    Hypertension Father    Heart disease Sister    Heart attack Paternal Grandmother    Cancer - Prostate Paternal Grandfather     ROS: no fevers or chills, productive cough, hemoptysis, dysphasia, odynophagia, melena, hematochezia, dysuria, hematuria, rash, seizure activity, orthopnea, PND, pedal edema, claudication. Remaining systems are negative.  Physical Exam: Well-developed well-nourished in no acute distress.  Skin is warm and dry.  HEENT is normal.  Neck is supple.  Chest is clear to auscultation with normal expansion.  Cardiovascular exam is regular rate and rhythm.  Abdominal exam nontender or distended. No masses palpated. Extremities show no edema. neuro grossly intact   A/P  1 coronary artery disease-patient doing well from a symptomatic standpoint.  Continue aspirin and statin.  2 hypertension-patient's blood pressure is borderline but he follows this at home and it is typically controlled.  Continue present medications and follow.  3 hyperlipidemia-continue  statin.  4 previous history of palpitations-he has had no recurrences. Will follow.  Olga Millers, MD

## 2022-08-06 ENCOUNTER — Other Ambulatory Visit: Payer: Self-pay | Admitting: Cardiology

## 2022-08-06 DIAGNOSIS — I2510 Atherosclerotic heart disease of native coronary artery without angina pectoris: Secondary | ICD-10-CM

## 2022-08-12 ENCOUNTER — Encounter: Payer: Self-pay | Admitting: Cardiology

## 2022-08-12 ENCOUNTER — Ambulatory Visit: Payer: Medicare HMO | Attending: Cardiology | Admitting: Cardiology

## 2022-08-12 VITALS — BP 142/80 | HR 55 | Ht 70.0 in | Wt 195.8 lb

## 2022-08-12 DIAGNOSIS — I10 Essential (primary) hypertension: Secondary | ICD-10-CM

## 2022-08-12 DIAGNOSIS — Z01810 Encounter for preprocedural cardiovascular examination: Secondary | ICD-10-CM | POA: Diagnosis not present

## 2022-08-12 DIAGNOSIS — R002 Palpitations: Secondary | ICD-10-CM | POA: Diagnosis not present

## 2022-08-12 DIAGNOSIS — I2510 Atherosclerotic heart disease of native coronary artery without angina pectoris: Secondary | ICD-10-CM | POA: Diagnosis not present

## 2022-08-12 MED ORDER — NITROGLYCERIN 0.4 MG SL SUBL: 0.4000 mg | SUBLINGUAL_TABLET | SUBLINGUAL | 6 refills | Status: AC | PRN

## 2022-08-12 NOTE — Patient Instructions (Signed)
  Follow-Up: At [facility], you and your health needs are our priority.  As part of our continuing mission to provide you with exceptional heart care, we have created designated Provider Care Teams.  These Care Teams include your primary Cardiologist (physician) and Advanced Practice Providers (APPs -  Physician Assistants and Nurse Practitioners) who all work together to provide you with the care you need, when you need it.  We recommend signing up for the patient portal called "MyChart".  Sign up information is provided on this After Visit Summary.  MyChart is used to connect with patients for Virtual Visits (Telemedicine).  Patients are able to view lab/test results, encounter notes, upcoming appointments, etc.  Non-urgent messages can be sent to your provider as well.   To learn more about what you can do with MyChart, go to [email or web address].    Your next appointment:   12 month(s)  Provider:   Brian Crenshaw MD    

## 2022-09-15 ENCOUNTER — Other Ambulatory Visit: Payer: Self-pay | Admitting: Cardiology

## 2022-09-15 DIAGNOSIS — I2510 Atherosclerotic heart disease of native coronary artery without angina pectoris: Secondary | ICD-10-CM

## 2022-10-29 DIAGNOSIS — H524 Presbyopia: Secondary | ICD-10-CM | POA: Diagnosis not present

## 2022-10-29 DIAGNOSIS — H26493 Other secondary cataract, bilateral: Secondary | ICD-10-CM | POA: Diagnosis not present

## 2022-10-29 DIAGNOSIS — H18513 Endothelial corneal dystrophy, bilateral: Secondary | ICD-10-CM | POA: Diagnosis not present

## 2022-10-29 DIAGNOSIS — H35373 Puckering of macula, bilateral: Secondary | ICD-10-CM | POA: Diagnosis not present

## 2022-10-29 DIAGNOSIS — H04123 Dry eye syndrome of bilateral lacrimal glands: Secondary | ICD-10-CM | POA: Diagnosis not present

## 2022-10-30 DIAGNOSIS — F330 Major depressive disorder, recurrent, mild: Secondary | ICD-10-CM | POA: Diagnosis not present

## 2022-10-30 DIAGNOSIS — F419 Anxiety disorder, unspecified: Secondary | ICD-10-CM | POA: Diagnosis not present

## 2022-10-30 DIAGNOSIS — Z23 Encounter for immunization: Secondary | ICD-10-CM | POA: Diagnosis not present

## 2023-01-26 ENCOUNTER — Other Ambulatory Visit: Payer: Self-pay

## 2023-01-26 DIAGNOSIS — I2510 Atherosclerotic heart disease of native coronary artery without angina pectoris: Secondary | ICD-10-CM

## 2023-01-26 MED ORDER — METOPROLOL SUCCINATE ER 25 MG PO TB24: 25.0000 mg | ORAL_TABLET | Freq: Every day | ORAL | 1 refills | Status: DC

## 2023-03-09 ENCOUNTER — Encounter: Payer: Self-pay | Admitting: Cardiology

## 2023-03-09 ENCOUNTER — Ambulatory Visit: Payer: Medicare HMO | Attending: Cardiology | Admitting: Cardiology

## 2023-03-09 VITALS — BP 146/74 | HR 57 | Ht 70.0 in | Wt 195.0 lb

## 2023-03-09 DIAGNOSIS — I249 Acute ischemic heart disease, unspecified: Secondary | ICD-10-CM

## 2023-03-09 DIAGNOSIS — R002 Palpitations: Secondary | ICD-10-CM

## 2023-03-09 DIAGNOSIS — I214 Non-ST elevation (NSTEMI) myocardial infarction: Secondary | ICD-10-CM

## 2023-03-09 DIAGNOSIS — I2510 Atherosclerotic heart disease of native coronary artery without angina pectoris: Secondary | ICD-10-CM | POA: Diagnosis not present

## 2023-03-09 DIAGNOSIS — R0609 Other forms of dyspnea: Secondary | ICD-10-CM | POA: Diagnosis not present

## 2023-03-09 DIAGNOSIS — I10 Essential (primary) hypertension: Secondary | ICD-10-CM

## 2023-03-09 DIAGNOSIS — E782 Mixed hyperlipidemia: Secondary | ICD-10-CM | POA: Diagnosis not present

## 2023-03-09 MED ORDER — AMLODIPINE BESYLATE 10 MG PO TABS: 10.0000 mg | ORAL_TABLET | Freq: Every day | ORAL | 3 refills | Status: AC

## 2023-03-09 NOTE — Patient Instructions (Signed)
 Medication Instructions:   STOP METOPROLOL  INCREASE AMLODIPINE TO 10 MG ONCE DAILY=2 OF THE 5 MG TABLETS ONCE DAILY UNTIL CURRENT SUPPLY COMPLETE  *If you need a refill on your cardiac medications before your next appointment, please call your pharmacy*   Testing/Procedures:  Your physician has requested that you have an echocardiogram. Echocardiography is a painless test that uses sound waves to create images of your heart. It provides your doctor with information about the size and shape of your heart and how well your heart's chambers and valves are working. This procedure takes approximately one hour. There are no restrictions for this procedure. Please do NOT wear cologne, perfume, aftershave, or lotions (deodorant is allowed). Please arrive 15 minutes prior to your appointment time.  Please note: We ask at that you not bring children with you during ultrasound (echo/ vascular) testing. Due to room size and safety concerns, children are not allowed in the ultrasound rooms during exams. Our front office staff cannot provide observation of children in our lobby area while testing is being conducted. An adult accompanying a patient to their appointment will only be allowed in the ultrasound room at the discretion of the ultrasound technician under special circumstances. We apologize for any inconvenience. [address]   Follow-Up: At [facility], you and your health needs are our priority.  As part of our continuing mission to provide you with exceptional heart care, we have created designated Provider Care Teams.  These Care Teams include your primary Cardiologist (physician) and Advanced Practice Providers (APPs -  Physician Assistants and Nurse Practitioners) who all work together to provide you with the care you need, when you need it.  We recommend signing up for the patient portal called "MyChart".  Sign up information is provided on this After Visit Summary.   MyChart is used to connect with patients for Virtual Visits (Telemedicine).  Patients are able to view lab/test results, encounter notes, upcoming appointments, etc.  Non-urgent messages can be sent to your provider as well.   To learn more about what you can do with MyChart, go to [email or web address].    Your next appointment:   12 month(s)  Provider:   Olga Millers, MD

## 2023-03-09 NOTE — Progress Notes (Signed)
 HPI:  FU coronary artery disease.  Previously followed by Dr. Rosemary Holms.  Admitted with non-ST elevation myocardial infarction August 2021. Cardiac catheterization revealed a 90% ostial LAD and 90% OM1.  Echocardiogram revealed normal LV function.  Preoperative carotid Doppler showed 1 to 39% bilateral stenosis.  Patient had coronary artery bypass and graft with a LIMA to the LAD, saphenous vein graft to the diagonal and saphenous vein graft to the obtuse marginal.  Monitor July 2022 showed sinus rhythm with PAT, PACs, PVCs and no atrial fibrillation was noted.  2 patient events correlated with sinus by report. Since last seen, he has some dyspnea on exertion and less energy by his report.  He denies chest pain or syncope.  Current Outpatient Medications  Medication Sig Dispense Refill   ALPRAZolam (XANAX) 0.5 MG tablet Take 0.5 mg by mouth as needed.     amLODipine (NORVASC) 5 MG tablet TAKE 1 TABLET BY MOUTH DAILY 90 tablet 3   aspirin 81 MG chewable tablet Chew 81 mg by mouth daily.     atorvastatin (LIPITOR) 40 MG tablet TAKE 1 TABLET BY MOUTH DAILY 90 tablet 3   methocarbamol (ROBAXIN) 500 MG tablet Take 1 tablet (500 mg total) by mouth every 6 (six) hours as needed for muscle spasms. 30 tablet 2   metoprolol succinate (TOPROL-XL) 25 MG 24 hr tablet Take 1 tablet (25 mg total) by mouth daily. 90 tablet 1   oxyCODONE-acetaminophen (PERCOCET/ROXICET) 5-325 MG tablet Take 1-2 tablets by mouth every 4 (four) hours as needed for severe pain. 30 tablet 0   Polyvinyl Alcohol-Povidone PF 1.4-0.6 % SOLN as needed.     sertraline (ZOLOFT) 50 MG tablet Take 50 mg by mouth daily with breakfast.     nitroGLYCERIN (NITROSTAT) 0.4 MG SL tablet Place 1 tablet (0.4 mg total) under the tongue every 5 (five) minutes as needed for chest pain. (Patient not taking: Reported on 03/09/2023) 25 tablet 6   No current facility-administered medications for this visit.     Past Medical History:  Diagnosis Date    Arthritis    CAD (coronary artery disease)    Compression of sciatic nerve    right leg with numbness above knee at times   Depression    Dysrhythmia 01/20/2003   no cardiologist   GERD (gastroesophageal reflux disease)    H/O hiatal hernia    Hemorrhoids    History of kidney stones    Hyperlipidemia    Hypertension    pt unaware 08/18/11   Myocardial infarction ([facility])    08/2018   Nephrolithiasis    Neuropathy    feet   Pain, lower leg    burning right leg--STATES TO ELEVATE RIGHT ANKLE   Psoriasis    Seasonal allergies    hay fever   Sleep apnea    STOP BANG SCORE 4   Umbilical hernia     Past Surgical History:  Procedure Laterality Date   CORONARY ARTERY BYPASS GRAFT N/A 09/11/2019   Procedure: CORONARY ARTERY BYPASS GRAFTING (CABG), ON PUMP, TIMES THREE, USING LEFT INTERNAL MAMMARY ARTERY AND ENDOSCOPICALLY HARVESTED RIGHT GREATER SAPHENOUS VEIN;  Surgeon: Alleen Borne, MD;  Location: [facility] OR;  Service: Open Heart Surgery;  Laterality: N/A;   HERNIA REPAIR  08/26/2011   INNER EAR SURGERY  07/24/2009   LEFT HEART CATH AND CORONARY ANGIOGRAPHY N/A 09/07/2019   Procedure: LEFT HEART CATH AND CORONARY ANGIOGRAPHY;  Surgeon: Elder Negus, MD;  Location: [facility] INVAS[facility] CV LAB;  Service: Cardiovascular;  Laterality: N/A;   PROSTATE SURGERY  1998   TURP   TEE WITHOUT CARDIOVERSION N/A 09/11/2019   Procedure: TRANSESOPHAGEAL ECHOCARDIOGRAM (TEE);  Surgeon: Alleen Borne, MD;  Location: [facility] OR;  Service: Open Heart Surgery;  Laterality: N/A;   TRANSFORAMINAL LUMBAR INTERBODY FUSION (TLIF) WITH PEDICLE SCREW FIXATION 1 LEVEL Left 01/29/2022   Procedure: LEFT SIDED LUMBAR FOUR THROUGH LUMBAR FIVE TRANSFORAMINAL LUMBAR INTERBODY FUSION AND DECOMPRESSION WITH INSTRUMENTATION AND ALLOGRAFT;  Surgeon: Estill Bamberg, MD;  Location: [facility] OR;  Service: Orthopedics;  Laterality: Left;   UMBILICAL HERNIA REPAIR  08/26/2011   Procedure: HERNIA REPAIR UMBILICAL ADULT;  Surgeon: [name].  [name], MD;  Location: [facility] ORS;  Service: General;  Laterality: N/A;  Umbilical Hernia Repair with Mesh    Social History   Socioeconomic History   Marital status: Married    Spouse name: Nettie Elm   Number of children: 2   Years of education: Not on file   Highest education level: Not on file  Occupational History   Not on file  Tobacco Use   Smoking status: Never   Smokeless tobacco: Never  Vaping Use   Vaping status: Never Used  Substance and Sexual Activity   Alcohol use: No   Drug use: No   Sexual activity: Not on file  Other Topics Concern   Not on file  Social History Narrative   Not on file   Social Drivers of Health   Financial Resource Strain: Not on file  Food Insecurity: Not on file  Transportation Needs: Not on file  Physical Activity: Not on file  Stress: Not on file  Social Connections: Not on file  Intimate Partner Violence: Not on file    Family History  Problem Relation Age of Onset   Cancer Mother        breast   Heart disease Mother    Kidney disease Father    Heart attack Father    Hypertension Father    Heart disease Sister    Heart attack Paternal Grandmother    Cancer - Prostate Paternal Grandfather     ROS: no fevers or chills, productive cough, hemoptysis, dysphasia, odynophagia, melena, hematochezia, dysuria, hematuria, rash, seizure activity, orthopnea, PND, pedal edema, claudication. Remaining systems are negative.  Physical Exam: Well-developed well-nourished in no acute distress.  Skin is warm and dry.  HEENT is normal.  Neck is supple.  Chest is clear to auscultation with normal expansion.  Cardiovascular exam is regular rate and rhythm.  Abdominal exam nontender or distended. No masses palpated. Extremities show no edema. neuro grossly intact  EKG Interpretation Date/Time:  Tuesday March 09 2023 11:17:34 EST Ventricular Rate:  57 PR Interval:  184 QRS Duration:  88 QT Interval:  416 QTC Calculation: 404 R  Axis:   19  Text Interpretation: Sinus bradycardia with Premature atrial complexes Confirmed by Olga Millers ([id number]) on 03/09/2023 11:19:42 AM    A/P  1 coronary artery disease-patient denies chest pain.  Continue aspirin and statin.  Some dyspnea on exertion.  Will repeat echocardiogram.  2 hyperlipidemia-continue statin.  Lipids and liver monitored by primary care.  3 hypertension-blood pressure mildly elevated; I am discontinuing his metoprolol as he has some decreased energy and I wonder if this may be contributing.  We will increase amlodipine to 10 mg daily and follow blood pressure.  4 palpitations-patient denies recurrences.  Olga Millers, MD

## 2023-03-26 ENCOUNTER — Ambulatory Visit (HOSPITAL_COMMUNITY): Payer: Medicare HMO | Attending: Cardiology

## 2023-03-26 DIAGNOSIS — R0609 Other forms of dyspnea: Secondary | ICD-10-CM | POA: Diagnosis not present

## 2023-03-26 LAB — ECHOCARDIOGRAM COMPLETE
Area-P 1/2: 4.21 cm2
S' Lateral: 2.8 cm

## 2023-05-03 DIAGNOSIS — I10 Essential (primary) hypertension: Secondary | ICD-10-CM | POA: Diagnosis not present

## 2023-05-03 DIAGNOSIS — E785 Hyperlipidemia, unspecified: Secondary | ICD-10-CM | POA: Diagnosis not present

## 2023-06-22 ENCOUNTER — Other Ambulatory Visit: Payer: Self-pay | Admitting: Cardiology

## 2023-06-22 DIAGNOSIS — I2510 Atherosclerotic heart disease of native coronary artery without angina pectoris: Secondary | ICD-10-CM

## 2023-07-13 DIAGNOSIS — Z9889 Other specified postprocedural states: Secondary | ICD-10-CM | POA: Diagnosis not present

## 2023-07-13 DIAGNOSIS — H6692 Otitis media, unspecified, left ear: Secondary | ICD-10-CM | POA: Diagnosis not present

## 2023-07-20 ENCOUNTER — Ambulatory Visit (INDEPENDENT_AMBULATORY_CARE_PROVIDER_SITE_OTHER): Admitting: Physician Assistant

## 2023-07-20 VITALS — BP 135/79 | HR 66

## 2023-07-20 DIAGNOSIS — H60502 Unspecified acute noninfective otitis externa, left ear: Secondary | ICD-10-CM | POA: Diagnosis not present

## 2023-07-20 MED ORDER — CIPROFLOXACIN-DEXAMETHASONE 0.3-0.1 % OT SUSP: 4.0000 [drp] | Freq: Two times a day (BID) | OTIC | 0 refills | Status: AC

## 2023-07-20 NOTE — Progress Notes (Unsigned)
 Dear Dr. Gib, Here is my assessment for our mutual patient, Carlos Davidson. Thank you for allowing me the opportunity to care for your patient. Please do not hesitate to contact me should you have any other questions. Sincerely, Chyrl Cohen PA-C  Otolaryngology Clinic Note Referring provider: Dr. Gib HPI:  Carlos Davidson is a 83 y.o. male kindly referred by Dr. Gib   The patient is an 83 year old gentleman seen in our office for evaluation of ear drainage.  The patient notes a significant past medical history of ruptured left tympanic membrane approximately 15 years ago. He underwent tympanomastoidectomy in 2012. He notes he did well until several years ago when he developed drainage from his left ear, he was seen by Dr. Carlie and started on otic powder with concern for fungal infection.  He notes that approximately three weeks ago he started to have some drainage from his left ear, minimal amount from right ear. He notes that the drainage has improved but still has a minimal amount. He denies fever or pain, tonal tinnitus that started around the same time. He feels his hearing is normal. He denies any water  exposure or trauma to the ear. He is not diabetic. He was seen by his PCP on 07/13/23 and started on Augmentin given previous history.   Independent Review of Additional Tests or Records:  PCP summary note on 07/13/2023   PMH/Meds/All/SocHx/FamHx/ROS:   Past Medical History:  Diagnosis Date   Arthritis    CAD (coronary artery disease)    Compression of sciatic nerve    right leg with numbness above knee at times   Depression    Dysrhythmia 01/20/2003   no cardiologist   GERD (gastroesophageal reflux disease)    H/O hiatal hernia    Hemorrhoids    History of kidney stones    Hyperlipidemia    Hypertension    pt unaware 08/18/11   Myocardial infarction (HCC)    08/2018   Nephrolithiasis    Neuropathy    feet   Pain, lower leg    burning right leg--STATES TO ELEVATE RIGHT  ANKLE   Psoriasis    Seasonal allergies    hay fever   Sleep apnea    STOP BANG SCORE 4   Umbilical hernia      Past Surgical History:  Procedure Laterality Date   CORONARY ARTERY BYPASS GRAFT N/A 09/11/2019   Procedure: CORONARY ARTERY BYPASS GRAFTING (CABG), ON PUMP, TIMES THREE, USING LEFT INTERNAL MAMMARY ARTERY AND ENDOSCOPICALLY HARVESTED RIGHT GREATER SAPHENOUS VEIN;  Surgeon: Lucas Dorise POUR, MD;  Location: [facility] OR;  Service: Open Heart Surgery;  Laterality: N/A;   HERNIA REPAIR  08/26/2011   INNER EAR SURGERY  07/24/2009   LEFT HEART CATH AND CORONARY ANGIOGRAPHY N/A 09/07/2019   Procedure: LEFT HEART CATH AND CORONARY ANGIOGRAPHY;  Surgeon: Elmira Newman PARAS, MD;  Location: [facility];  Service: Cardiovascular;  Laterality: N/A;   PROSTATE SURGERY  1998   TURP   TEE WITHOUT CARDIOVERSION N/A 09/11/2019   Procedure: TRANSESOPHAGEAL ECHOCARDIOGRAM (TEE);  Surgeon: Lucas Dorise POUR, MD;  Location: [facility] OR;  Service: Open Heart Surgery;  Laterality: N/A;   TRANSFORAMINAL LUMBAR INTERBODY FUSION (TLIF) WITH PEDICLE SCREW FIXATION 1 LEVEL Left 01/29/2022   Procedure: LEFT SIDED LUMBAR FOUR THROUGH LUMBAR FIVE TRANSFORAMINAL LUMBAR INTERBODY FUSION AND DECOMPRESSION WITH INSTRUMENTATION AND ALLOGRAFT;  Surgeon: Beuford Anes, MD;  Location: [facility] OR;  Service: Orthopedics;  Laterality: Left;   UMBILICAL HERNIA REPAIR  08/26/2011   Procedure:  HERNIA REPAIR UMBILICAL ADULT;  Surgeon: Donnice POUR. Belinda, MD;  Location: [facility];  Service: General;  Laterality: N/A;  Umbilical Hernia Repair with Mesh    Family History  Problem Relation Age of Onset   Cancer Mother        breast   Heart disease Mother    Kidney disease Father    Heart attack Father    Hypertension Father    Heart disease Sister    Heart attack Paternal Grandmother    Cancer - Prostate Paternal Grandfather      Social Connections: Not on file      Current Outpatient Medications:    amoxicillin-clavulanate (AUGMENTIN)  875-125 MG tablet, Take 1 tablet by mouth 2 (two) times daily., Disp: , Rfl:    ALPRAZolam (XANAX) 0.5 MG tablet, Take 0.5 mg by mouth as needed., Disp: , Rfl:    amLODipine  (NORVASC ) 10 MG tablet, Take 1 tablet (10 mg total) by mouth daily., Disp: 90 tablet, Rfl: 3   aspirin  81 MG chewable tablet, Chew 81 mg by mouth daily., Disp: , Rfl:    atorvastatin  (LIPITOR ) 40 MG tablet, TAKE 1 TABLET BY MOUTH DAILY, Disp: 90 tablet, Rfl: 1   methocarbamol  (ROBAXIN ) 500 MG tablet, Take 1 tablet (500 mg total) by mouth every 6 (six) hours as needed for muscle spasms. (Patient not taking: Reported on 03/09/2023), Disp: 30 tablet, Rfl: 2   nitroGLYCERIN  (NITROSTAT ) 0.4 MG SL tablet, Place 1 tablet (0.4 mg total) under the tongue every 5 (five) minutes as needed for chest pain. (Patient not taking: Reported on 03/09/2023), Disp: 25 tablet, Rfl: 6   oxyCODONE -acetaminophen  (PERCOCET/ROXICET) 5-325 MG tablet, Take 1-2 tablets by mouth every 4 (four) hours as needed for severe pain. (Patient not taking: Reported on 03/09/2023), Disp: 30 tablet, Rfl: 0   Polyvinyl Alcohol -Povidone PF 1.4-0.6 % SOLN, as needed., Disp: , Rfl:    sertraline  (ZOLOFT ) 50 MG tablet, Take 50 mg by mouth daily with breakfast., Disp: , Rfl:    Physical Exam:   BP 135/79   Pulse 66   SpO2 94%   Pertinent Findings  CN II-XII intact Left external auditory canal with cerumen and minimal discharge, minimal erythema of the distal EAC.  Very thin layer of cerumen along the tympanic membrane, no obvious perforations, right EAC clear with intact TM well pneumatized middle ear space Left mastoid defect no tenderness, no overlying redness Weber 512: equal Rinne 512: AC > BC b/l  No lesions of oral cavity/oropharynx; dentition within normal limits No obviously palpable neck masses/lymphadenopathy/thyromegaly No respiratory distress or stridor  Seprately Identifiable Procedures:  None  Impression & Plans:  Carlos Davidson is a 83 y.o. male with the  following   Otitis externa-  Left-sided otitis externa, this appears to be resolving, he does have cerumen along the left TM.  I would recommend otic drops to clear the remainder of the infection as well as help soften the wax.  I like to see him back in the office in 2 weeks for repeat evaluation or sooner as needed.  The patient verbalized understanding and agreement to today's plan.   - f/u 2-weeks   Thank you for allowing me the opportunity to care for your patient. Please do not hesitate to contact me should you have any other questions.  Sincerely, Chyrl Cohen PA-C [facility] Phone: [phone] Fax: [phone]  07/20/2023, 3:54 PM

## 2023-08-04 ENCOUNTER — Encounter (INDEPENDENT_AMBULATORY_CARE_PROVIDER_SITE_OTHER): Payer: Self-pay | Admitting: Physician Assistant

## 2023-08-04 ENCOUNTER — Ambulatory Visit (INDEPENDENT_AMBULATORY_CARE_PROVIDER_SITE_OTHER): Admitting: Physician Assistant

## 2023-08-04 VITALS — BP 120/68 | HR 67

## 2023-08-04 DIAGNOSIS — H60502 Unspecified acute noninfective otitis externa, left ear: Secondary | ICD-10-CM

## 2023-08-04 DIAGNOSIS — Z8669 Personal history of other diseases of the nervous system and sense organs: Secondary | ICD-10-CM | POA: Diagnosis not present

## 2023-08-04 DIAGNOSIS — Z09 Encounter for follow-up examination after completed treatment for conditions other than malignant neoplasm: Secondary | ICD-10-CM

## 2023-08-04 NOTE — Progress Notes (Signed)
 Dear Dr. Austin, Here is my assessment for our mutual patient, Carlos Davidson. Thank you for allowing me the opportunity to care for your patient. Please do not hesitate to contact me should you have any other questions. Sincerely, Chyrl Cohen PA-C  Otolaryngology Clinic Note Referring provider: Dr. Austin HPI:  Carlos Davidson is a 83 y.o. male kindly referred by Dr. Austin   The patient is an 83 year old male seen in our office for follow-up evaluation of otitis externa.  His last seen in the office on 07/20/2023.  Below is a recap of that encounter.  The patient is an 83 year old gentleman seen in our office for evaluation of ear drainage.  The patient notes a significant past medical history of ruptured left tympanic membrane approximately 15 years ago. He underwent tympanomastoidectomy in 2012. He notes he did well until several years ago when he developed drainage from his left ear, he was seen by Dr. Carlie and started on otic powder with concern for fungal infection.  He notes that approximately three weeks ago he started to have some drainage from his left ear, minimal amount from right ear. He notes that the drainage has improved but still has a minimal amount. He denies fever or pain, tonal tinnitus that started around the same time. He feels his hearing is normal. He denies any water  exposure or trauma to the ear. He is not diabetic. He was seen by his PCP on 07/13/23 and started on Augmentin given previous history.   Update 08/04/2023  Since his last office visit he notes his symptoms have resolved, he denies any acute concerns today.  No persistent drainage.  He notes he is able to hear his watch clicking on his wrist with appropriate hearing levels.  He has continued using the drops.    Independent Review of Additional Tests or Records:  none   PMH/Meds/All/SocHx/FamHx/ROS:   Past Medical History:  Diagnosis Date   Arthritis    CAD (coronary artery disease)    Compression of sciatic nerve     right leg with numbness above knee at times   Depression    Dysrhythmia 01/20/2003   no cardiologist   GERD (gastroesophageal reflux disease)    H/O hiatal hernia    Hemorrhoids    History of kidney stones    Hyperlipidemia    Hypertension    pt unaware 08/18/11   Myocardial infarction (HCC)    08/2018   Nephrolithiasis    Neuropathy    feet   Pain, lower leg    burning right leg--STATES TO ELEVATE RIGHT ANKLE   Psoriasis    Seasonal allergies    hay fever   Sleep apnea    STOP BANG SCORE 4   Umbilical hernia      Past Surgical History:  Procedure Laterality Date   CORONARY ARTERY BYPASS GRAFT N/A 09/11/2019   Procedure: CORONARY ARTERY BYPASS GRAFTING (CABG), ON PUMP, TIMES THREE, USING LEFT INTERNAL MAMMARY ARTERY AND ENDOSCOPICALLY HARVESTED RIGHT GREATER SAPHENOUS VEIN;  Surgeon: Lucas Dorise POUR, MD;  Location: [facility] OR;  Service: Open Heart Surgery;  Laterality: N/A;   HERNIA REPAIR  08/26/2011   INNER EAR SURGERY  07/24/2009   LEFT HEART CATH AND CORONARY ANGIOGRAPHY N/A 09/07/2019   Procedure: LEFT HEART CATH AND CORONARY ANGIOGRAPHY;  Surgeon: Elmira Newman PARAS, MD;  Location: [facility];  Service: Cardiovascular;  Laterality: N/A;   PROSTATE SURGERY  1998   TURP   TEE WITHOUT CARDIOVERSION N/A 09/11/2019  Procedure: TRANSESOPHAGEAL ECHOCARDIOGRAM (TEE);  Surgeon: Lucas Dorise POUR, MD;  Location: [facility] OR;  Service: Open Heart Surgery;  Laterality: N/A;   TRANSFORAMINAL LUMBAR INTERBODY FUSION (TLIF) WITH PEDICLE SCREW FIXATION 1 LEVEL Left 01/29/2022   Procedure: LEFT SIDED LUMBAR FOUR THROUGH LUMBAR FIVE TRANSFORAMINAL LUMBAR INTERBODY FUSION AND DECOMPRESSION WITH INSTRUMENTATION AND ALLOGRAFT;  Surgeon: Beuford Anes, MD;  Location: [facility] OR;  Service: Orthopedics;  Laterality: Left;   UMBILICAL HERNIA REPAIR  08/26/2011   Procedure: HERNIA REPAIR UMBILICAL ADULT;  Surgeon: Donnice POUR. Belinda, MD;  Location: [facility];  Service: General;  Laterality: N/A;  Umbilical Hernia  Repair with Mesh    Family History  Problem Relation Age of Onset   Cancer Mother        breast   Heart disease Mother    Kidney disease Father    Heart attack Father    Hypertension Father    Heart disease Sister    Heart attack Paternal Grandmother    Cancer - Prostate Paternal Grandfather      Social Connections: Not on file      Current Outpatient Medications:    ALPRAZolam (XANAX) 0.5 MG tablet, Take 0.5 mg by mouth as needed., Disp: , Rfl:    amLODipine  (NORVASC ) 10 MG tablet, Take 1 tablet (10 mg total) by mouth daily., Disp: 90 tablet, Rfl: 3   amoxicillin-clavulanate (AUGMENTIN) 875-125 MG tablet, Take 1 tablet by mouth 2 (two) times daily., Disp: , Rfl:    aspirin  81 MG chewable tablet, Chew 81 mg by mouth daily., Disp: , Rfl:    atorvastatin  (LIPITOR ) 40 MG tablet, TAKE 1 TABLET BY MOUTH DAILY, Disp: 90 tablet, Rfl: 1   ciprofloxacin -dexamethasone  (CIPRODEX ) OTIC suspension, Place 4 drops into the left ear 2 (two) times daily., Disp: 7.5 mL, Rfl: 0   methocarbamol  (ROBAXIN ) 500 MG tablet, Take 1 tablet (500 mg total) by mouth every 6 (six) hours as needed for muscle spasms. (Patient not taking: Reported on 03/09/2023), Disp: 30 tablet, Rfl: 2   nitroGLYCERIN  (NITROSTAT ) 0.4 MG SL tablet, Place 1 tablet (0.4 mg total) under the tongue every 5 (five) minutes as needed for chest pain. (Patient not taking: Reported on 03/09/2023), Disp: 25 tablet, Rfl: 6   oxyCODONE -acetaminophen  (PERCOCET/ROXICET) 5-325 MG tablet, Take 1-2 tablets by mouth every 4 (four) hours as needed for severe pain. (Patient not taking: Reported on 03/09/2023), Disp: 30 tablet, Rfl: 0   Polyvinyl Alcohol -Povidone PF 1.4-0.6 % SOLN, as needed., Disp: , Rfl:    sertraline  (ZOLOFT ) 50 MG tablet, Take 50 mg by mouth daily with breakfast., Disp: , Rfl:    Physical Exam:   BP 120/68   Pulse 67   SpO2 93%   Pertinent Findings  CN II-XII intact Bilateral EAC clear and TM intact with well pneumatized middle  ear spaces No obviously neck masses/lymphadenopathy/thyromegaly No respiratory distress or stridor  Seprately Identifiable Procedures:  None  Impression & Plans:  Jovani Flury is a 83 y.o. male with the following   Otitis externa-  Evaluation today shows no residual otitis externa, hearing back at baseline.  Patient may follow-up on a as needed basis.  Avoid water  exposure, no Q-tips.   - f/u PRN   Thank you for allowing me the opportunity to care for your patient. Please do not hesitate to contact me should you have any other questions.  Sincerely, Chyrl Cohen PA-C [facility] Phone: [phone] Fax: [phone]  08/04/2023, 1:44 PM

## 2023-11-08 DIAGNOSIS — I2510 Atherosclerotic heart disease of native coronary artery without angina pectoris: Secondary | ICD-10-CM | POA: Diagnosis not present

## 2023-11-08 DIAGNOSIS — F330 Major depressive disorder, recurrent, mild: Secondary | ICD-10-CM | POA: Diagnosis not present

## 2023-11-08 DIAGNOSIS — M79644 Pain in right finger(s): Secondary | ICD-10-CM | POA: Diagnosis not present

## 2023-11-08 DIAGNOSIS — F419 Anxiety disorder, unspecified: Secondary | ICD-10-CM | POA: Diagnosis not present

## 2023-11-08 DIAGNOSIS — Z23 Encounter for immunization: Secondary | ICD-10-CM | POA: Diagnosis not present

## 2023-11-11 DIAGNOSIS — H26493 Other secondary cataract, bilateral: Secondary | ICD-10-CM | POA: Diagnosis not present

## 2023-11-11 DIAGNOSIS — H35373 Puckering of macula, bilateral: Secondary | ICD-10-CM | POA: Diagnosis not present

## 2023-11-11 DIAGNOSIS — H35033 Hypertensive retinopathy, bilateral: Secondary | ICD-10-CM | POA: Diagnosis not present

## 2023-11-11 DIAGNOSIS — H04123 Dry eye syndrome of bilateral lacrimal glands: Secondary | ICD-10-CM | POA: Diagnosis not present

## 2023-11-17 DIAGNOSIS — D692 Other nonthrombocytopenic purpura: Secondary | ICD-10-CM | POA: Diagnosis not present

## 2023-11-17 DIAGNOSIS — L821 Other seborrheic keratosis: Secondary | ICD-10-CM | POA: Diagnosis not present

## 2023-11-17 DIAGNOSIS — L570 Actinic keratosis: Secondary | ICD-10-CM | POA: Diagnosis not present

## 2023-12-21 DIAGNOSIS — H04123 Dry eye syndrome of bilateral lacrimal glands: Secondary | ICD-10-CM | POA: Diagnosis not present

## 2023-12-21 DIAGNOSIS — H26493 Other secondary cataract, bilateral: Secondary | ICD-10-CM | POA: Diagnosis not present

## 2023-12-21 DIAGNOSIS — Z961 Presence of intraocular lens: Secondary | ICD-10-CM | POA: Diagnosis not present
